# Patient Record
Sex: Female | Born: 1965
Health system: Southern US, Community
[De-identification: ages and names within clinical notes are randomized; demographics above are authoritative.]

## PROBLEM LIST (undated history)

## (undated) DIAGNOSIS — D509 Iron deficiency anemia, unspecified: Secondary | ICD-10-CM

## (undated) DIAGNOSIS — C50919 Malignant neoplasm of unspecified site of unspecified female breast: Secondary | ICD-10-CM

## (undated) DIAGNOSIS — Z923 Personal history of irradiation: Secondary | ICD-10-CM

## (undated) DIAGNOSIS — Z87442 Personal history of urinary calculi: Secondary | ICD-10-CM

## (undated) DIAGNOSIS — T8859XA Other complications of anesthesia, initial encounter: Secondary | ICD-10-CM

## (undated) DIAGNOSIS — K219 Gastro-esophageal reflux disease without esophagitis: Secondary | ICD-10-CM

## (undated) DIAGNOSIS — E611 Iron deficiency: Secondary | ICD-10-CM

## (undated) HISTORY — DX: Iron deficiency anemia, unspecified: D50.9

## (undated) HISTORY — DX: Malignant neoplasm of unspecified site of unspecified female breast: C50.919

## (undated) HISTORY — DX: Iron deficiency: E61.1

## (undated) HISTORY — PX: BREAST LUMPECTOMY: SHX2

---

## 2003-07-26 ENCOUNTER — Other Ambulatory Visit: Admission: RE | Admit: 2003-07-26 | Discharge: 2003-07-26 | Payer: Self-pay | Admitting: Dermatology

## 2004-04-09 ENCOUNTER — Ambulatory Visit (HOSPITAL_COMMUNITY): Admission: RE | Admit: 2004-04-09 | Discharge: 2004-04-09 | Payer: Self-pay | Admitting: Family Medicine

## 2005-06-04 ENCOUNTER — Ambulatory Visit (HOSPITAL_COMMUNITY): Admission: RE | Admit: 2005-06-04 | Discharge: 2005-06-04 | Payer: Self-pay | Admitting: Family Medicine

## 2006-01-29 ENCOUNTER — Ambulatory Visit (HOSPITAL_COMMUNITY): Admission: RE | Admit: 2006-01-29 | Discharge: 2006-01-29 | Payer: Self-pay | Admitting: Family Medicine

## 2006-02-14 HISTORY — PX: OTHER SURGICAL HISTORY: SHX169

## 2006-03-13 ENCOUNTER — Ambulatory Visit (HOSPITAL_COMMUNITY): Admission: RE | Admit: 2006-03-13 | Discharge: 2006-03-13 | Payer: Self-pay | Admitting: Surgery

## 2006-03-13 ENCOUNTER — Encounter (INDEPENDENT_AMBULATORY_CARE_PROVIDER_SITE_OTHER): Payer: Self-pay | Admitting: *Deleted

## 2006-04-15 ENCOUNTER — Ambulatory Visit (HOSPITAL_BASED_OUTPATIENT_CLINIC_OR_DEPARTMENT_OTHER): Admission: RE | Admit: 2006-04-15 | Discharge: 2006-04-15 | Payer: Self-pay | Admitting: Surgery

## 2006-04-15 ENCOUNTER — Encounter (INDEPENDENT_AMBULATORY_CARE_PROVIDER_SITE_OTHER): Payer: Self-pay | Admitting: Specialist

## 2006-04-15 ENCOUNTER — Emergency Department (HOSPITAL_COMMUNITY): Admission: EM | Admit: 2006-04-15 | Discharge: 2006-04-15 | Payer: Self-pay | Admitting: Emergency Medicine

## 2006-04-15 HISTORY — PX: OTHER SURGICAL HISTORY: SHX169

## 2006-05-16 ENCOUNTER — Ambulatory Visit (HOSPITAL_COMMUNITY): Payer: Self-pay | Admitting: Oncology

## 2006-05-20 ENCOUNTER — Ambulatory Visit: Admission: RE | Admit: 2006-05-20 | Discharge: 2006-07-23 | Payer: Self-pay | Admitting: Radiation Oncology

## 2007-01-21 ENCOUNTER — Ambulatory Visit (HOSPITAL_COMMUNITY): Payer: Self-pay | Admitting: Oncology

## 2007-01-21 ENCOUNTER — Encounter (HOSPITAL_COMMUNITY): Admission: RE | Admit: 2007-01-21 | Discharge: 2007-02-20 | Payer: Self-pay | Admitting: Oncology

## 2007-05-15 ENCOUNTER — Ambulatory Visit (HOSPITAL_COMMUNITY): Payer: Self-pay | Admitting: Oncology

## 2008-02-18 ENCOUNTER — Encounter (HOSPITAL_COMMUNITY): Admission: RE | Admit: 2008-02-18 | Discharge: 2008-03-19 | Payer: Self-pay | Admitting: Oncology

## 2008-05-20 ENCOUNTER — Ambulatory Visit (HOSPITAL_COMMUNITY): Payer: Self-pay | Admitting: Oncology

## 2009-02-02 ENCOUNTER — Ambulatory Visit (HOSPITAL_COMMUNITY): Admission: RE | Admit: 2009-02-02 | Discharge: 2009-02-02 | Payer: Self-pay | Admitting: Family Medicine

## 2009-03-22 ENCOUNTER — Ambulatory Visit (HOSPITAL_COMMUNITY): Admission: RE | Admit: 2009-03-22 | Discharge: 2009-03-22 | Payer: Self-pay | Admitting: Oncology

## 2009-06-02 ENCOUNTER — Encounter (HOSPITAL_COMMUNITY): Admission: RE | Admit: 2009-06-02 | Discharge: 2009-07-02 | Payer: Self-pay | Admitting: Oncology

## 2009-06-02 ENCOUNTER — Ambulatory Visit (HOSPITAL_COMMUNITY): Payer: Self-pay | Admitting: Oncology

## 2009-07-31 ENCOUNTER — Encounter (HOSPITAL_COMMUNITY): Admission: RE | Admit: 2009-07-31 | Discharge: 2009-08-30 | Payer: Self-pay | Admitting: Oncology

## 2009-07-31 ENCOUNTER — Ambulatory Visit (HOSPITAL_COMMUNITY): Payer: Self-pay | Admitting: Oncology

## 2009-11-03 ENCOUNTER — Encounter (HOSPITAL_COMMUNITY)
Admission: RE | Admit: 2009-11-03 | Discharge: 2009-12-03 | Payer: Self-pay | Source: Home / Self Care | Admitting: Oncology

## 2010-03-28 LAB — CBC
HCT: 37.5 % (ref 36.0–46.0)
MCH: 30.7 pg (ref 26.0–34.0)
RBC: 4.14 MIL/uL (ref 3.87–5.11)
RDW: 13.6 % (ref 11.5–15.5)
WBC: 5.7 10*3/uL (ref 4.0–10.5)

## 2010-03-31 LAB — DIFFERENTIAL
Eosinophils Absolute: 0.1 10*3/uL (ref 0.0–0.7)
Lymphs Abs: 1.4 10*3/uL (ref 0.7–4.0)
Monocytes Relative: 8 % (ref 3–12)
Neutro Abs: 3.6 10*3/uL (ref 1.7–7.7)
Neutrophils Relative %: 65 % (ref 43–77)

## 2010-03-31 LAB — FERRITIN: Ferritin: 14 ng/mL (ref 10–291)

## 2010-03-31 LAB — CBC
Hemoglobin: 12.3 g/dL (ref 12.0–15.0)
MCH: 28.4 pg (ref 26.0–34.0)
MCV: 84.4 fL (ref 78.0–100.0)
Platelets: 138 10*3/uL — ABNORMAL LOW (ref 150–400)
RBC: 4.32 MIL/uL (ref 3.87–5.11)

## 2010-04-02 ENCOUNTER — Other Ambulatory Visit (HOSPITAL_COMMUNITY): Payer: Self-pay | Admitting: Family Medicine

## 2010-04-02 DIAGNOSIS — C50919 Malignant neoplasm of unspecified site of unspecified female breast: Secondary | ICD-10-CM

## 2010-04-02 LAB — FOLATE: Folate: >20 ng/mL

## 2010-04-02 LAB — CBC
HCT: 34.9 % — ABNORMAL LOW (ref 36.0–46.0)
MCV: 77.8 fL — ABNORMAL LOW (ref 78.0–100.0)
Platelets: 181 10*3/uL (ref 150–400)
RBC: 4.48 MIL/uL (ref 3.87–5.11)

## 2010-04-02 LAB — FERRITIN: Ferritin: 14 ng/mL (ref 10–291)

## 2010-04-02 LAB — IRON AND TIBC
Iron: 23 ug/dL — ABNORMAL LOW (ref 42–135)
UIBC: 353 ug/dL

## 2010-04-02 LAB — VITAMIN B12: Vitamin B-12: 344 pg/mL (ref 211–911)

## 2010-04-02 LAB — DIFFERENTIAL
Eosinophils Relative: 1 % (ref 0–5)
Lymphocytes Relative: 13 % (ref 12–46)
Monocytes Absolute: 0.5 10*3/uL (ref 0.1–1.0)
Monocytes Relative: 5 % (ref 3–12)
Neutrophils Relative %: 81 % — ABNORMAL HIGH (ref 43–77)

## 2010-04-02 LAB — RETICULOCYTES
RBC.: 4.48 MIL/uL (ref 3.87–5.11)
Retic Count, Absolute: 44.8 10*3/uL (ref 19.0–186.0)
Retic Ct Pct: 1 % (ref 0.4–3.1)

## 2010-04-02 LAB — COMPREHENSIVE METABOLIC PANEL
BUN: 12 mg/dL (ref 6–23)
CO2: 28 mEq/L (ref 19–32)
Chloride: 104 mEq/L (ref 96–112)
GFR calc Af Amer: >60 mL/min (ref >60)
Glucose, Bld: 77 mg/dL (ref 70–99)
Potassium: 3.9 mEq/L (ref 3.5–5.1)

## 2010-04-11 ENCOUNTER — Ambulatory Visit (HOSPITAL_COMMUNITY)
Admission: RE | Admit: 2010-04-11 | Discharge: 2010-04-11 | Disposition: A | Discharge status: Home / Self Care | Payer: BC Managed Care – PPO | Source: Ambulatory Visit | Attending: Family Medicine | Admitting: Family Medicine

## 2010-04-11 DIAGNOSIS — Z853 Personal history of malignant neoplasm of breast: Secondary | ICD-10-CM | POA: Insufficient documentation

## 2010-04-11 DIAGNOSIS — C50919 Malignant neoplasm of unspecified site of unspecified female breast: Secondary | ICD-10-CM

## 2010-05-03 ENCOUNTER — Other Ambulatory Visit (HOSPITAL_COMMUNITY): Payer: Self-pay

## 2010-05-04 ENCOUNTER — Encounter (HOSPITAL_COMMUNITY): Payer: BC Managed Care – PPO | Attending: Oncology

## 2010-05-04 DIAGNOSIS — Z853 Personal history of malignant neoplasm of breast: Secondary | ICD-10-CM | POA: Insufficient documentation

## 2010-05-04 DIAGNOSIS — D509 Iron deficiency anemia, unspecified: Secondary | ICD-10-CM | POA: Insufficient documentation

## 2010-05-04 DIAGNOSIS — C50919 Malignant neoplasm of unspecified site of unspecified female breast: Secondary | ICD-10-CM

## 2010-05-07 ENCOUNTER — Encounter (HOSPITAL_COMMUNITY): Payer: BC Managed Care – PPO | Admitting: Oncology

## 2010-05-07 DIAGNOSIS — C50919 Malignant neoplasm of unspecified site of unspecified female breast: Secondary | ICD-10-CM

## 2010-06-01 NOTE — Op Note (Signed)
Melissa Rodriguez, PONDEXTER                 ACCOUNT NO.:  [id number]   MEDICAL RECORD NO.:  [id number]          PATIENT TYPE:  AMB   LOCATION:  DAY                          FACILITY:  [facility]   PHYSICIAN:  Thomas A. Cornett, M.D.DATE OF BIRTH:  Jun 08, 1965   DATE OF PROCEDURE:  03/13/2006  DATE OF DISCHARGE:                               OPERATIVE REPORT   PREOPERATIVE DIAGNOSIS:  Left breast mass.   POSTOPERATIVE DIAGNOSIS:  Left breast mass.   PROCEDURE:  Open left excisional breast biopsy.   SURGEON:  Harriette Bouillon, MD   ANESTHESIA:  MAC with 0.25% Sensorcaine with epinephrine.   ESTIMATED BLOOD LOSS:  10 mL.   SPECIMEN:  Left breast nodule to pathology for evaluation.   INDICATIONS FOR PROCEDURE:  The patient is a 45 year old female who is  noted to have a left breast mass.  This was too superficial to do a  biopsy by core technique, and she requires an excisional biopsy for  further evaluation.  The procedure was discussed with the patient.  She  voiced understanding and agreed to proceed.   DESCRIPTION OF PROCEDURE:  The patient was brought to the operating  room, placed supine.  Left breast was prepped and draped in a sterile  fashion.  The nodule was located at about the 7 o'clock position in her  left medial inferior quadrant.  This was marked preoperatively.  After  sterile prep and drape, local anesthesia was infiltrated around it and  small incision was made over it.  An Lavena Bullion was used to grasp the nodule,  and this was excised with some surrounding breast tissue that appeared  to be fibrocystic in nature.  This was taken all way down to the chest  wall.  The specimen was oriented and sent to pathology for evaluation.  The wound was irrigated and found be hemostatic.  The wound was closed  using 4-0 Monocryl and Dermabond.  The patient tolerated the procedure  well.  All final counts of sponge, needle and instruments were found to  be correct at this portion of the case.   The patient was awoke and taken  to recovery in satisfactory condition.      Thomas A. Cornett, M.D.  Electronically Signed     TAC/MEDQ  D:  03/13/2006  T:  03/13/2006  Job:  [id number]   cc:   Lorin Picket A. Gerda Diss, MD  Fax: [phone]

## 2010-06-01 NOTE — Op Note (Signed)
Melissa Rodriguez, Melissa Rodriguez                 ACCOUNT NO.:  [id number]   MEDICAL RECORD NO.:  [id number]          PATIENT TYPE:  OUT   LOCATION:  NUC                          FACILITY:  [facility]   PHYSICIAN:  Maisie Fus A. Cornett, M.D.DATE OF BIRTH:  [birth year]   DATE OF PROCEDURE:  04/15/2006  DATE OF DISCHARGE:                               OPERATIVE REPORT   PREOPERATIVE DIAGNOSIS:  Left breast cancer, status post left breast  lumpectomy.   POSTOPERATIVE DIAGNOSIS:  Left breast cancer, status post left breast  lumpectomy.   PROCEDURE:  Left axillary sentinel lymph node mapping with injection of  methylene blue dye.   SURGEON:  Harriette Bouillon, M.D.   ANESTHESIA:  LMA with 20 mL of 0.25% Sensorcaine local.   ESTIMATED BLOOD LOSS:  10 mL.   SPECIMEN:  Four left axillary sentinel lymph nodes found to be negative  by touch prep.   DRAINS:  None.   INDICATIONS FOR PROCEDURE:  The patient is a 45 year old female found to  have a left breast mass.  She underwent lumpectomy, which showed roughly  0.7 cm tubular carcinoma of the left breast with a adjacent area of  DCIS.  All margins were clear except the deep margin, which was muscle.  I did not feel re-excision this point was warranted being all the deep  margin were clear and that she needed sentinel lymph node mapping at  this point.  She understood and agreed to proceed.   DESCRIPTION OF PROCEDURE:  The patient underwent injection by nuclear  med approximately 30 minutes prior to starting the case.  She was then  taken back to the operating room.  LMA anesthesia was initiated and the  left breast and left axilla were prepped and draped in sterile fashion.  NeoProbe was used and a small incision was made in the left axilla.  Dissection was carried down into the left axillary lymphatics.  Prior to  this 4 mL of methylene blue dye were injected in a subareolar position  and massaged for five minutes.  We found blue lymphatics leading to  approximately three blue hot lymph nodes.  There is a fourth node  associated with this which were sent as well but was not blue or hot.  All four nodes were evaluated by touch prep and found to be negative for  metastatic disease.  Hemostasis was excellent.  We then closed the wound  with a combination of 3-0 Vicryl deep layer and four  Monocryl subcuticular layer.  Steri-Strips, dry dressings were applied.  All final counts of sponge, needle and instruments were found to be  correct this portion of the case.  The patient was awoke taken to  recovery in satisfactory condition.      Thomas A. Cornett, M.D.  Electronically Signed     TAC/MEDQ  D:  04/15/2006  T:  04/15/2006  Job:  [id number]   cc:   Lorin Picket A. Gerda Diss, MD  Elmer Sow. Dorna Bloom, M.D.  Valentino Hue. Magrinat, M.D.

## 2010-10-04 LAB — DIFFERENTIAL
Basophils Absolute: 0
Basophils Relative: 0
Neutro Abs: 4.8
Neutrophils Relative %: 74

## 2010-10-04 LAB — COMPREHENSIVE METABOLIC PANEL
Alkaline Phosphatase: 48
BUN: 9
CO2: 27
Chloride: 108
GFR calc non Af Amer: >60
Glucose, Bld: 103 — ABNORMAL HIGH
Potassium: 3.8
Total Bilirubin: 0.5

## 2010-10-04 LAB — CBC: RDW: 13.7

## 2010-11-05 ENCOUNTER — Encounter (HOSPITAL_COMMUNITY): Payer: BC Managed Care – PPO | Attending: Oncology

## 2010-11-05 DIAGNOSIS — D509 Iron deficiency anemia, unspecified: Secondary | ICD-10-CM

## 2010-11-05 LAB — CBC
HCT: 38.8 % (ref 36.0–46.0)
Hemoglobin: 13 g/dL (ref 12.0–15.0)
MCV: 92.6 fL (ref 78.0–100.0)
Platelets: 142 10*3/uL — ABNORMAL LOW (ref 150–400)
RBC: 4.19 MIL/uL (ref 3.87–5.11)
RBC: 4.22 MIL/uL (ref 3.87–5.11)
WBC: 5.5 10*3/uL (ref 4.0–10.5)

## 2010-11-05 NOTE — Progress Notes (Signed)
Melissa Rodriguez presented for labwork. Labs per MD order drawn via peripheral stick lt ac. Lab drawn for cbc and ferritin. Procedure without incident.  Patient tolerated procedure well.

## 2010-11-11 ENCOUNTER — Encounter (HOSPITAL_COMMUNITY): Payer: Self-pay

## 2010-11-12 ENCOUNTER — Encounter (HOSPITAL_COMMUNITY): Payer: Self-pay | Admitting: Oncology

## 2010-11-12 ENCOUNTER — Encounter (HOSPITAL_COMMUNITY): Payer: BC Managed Care – PPO | Admitting: Oncology

## 2010-11-12 DIAGNOSIS — D509 Iron deficiency anemia, unspecified: Secondary | ICD-10-CM

## 2010-11-12 DIAGNOSIS — C50919 Malignant neoplasm of unspecified site of unspecified female breast: Secondary | ICD-10-CM

## 2010-11-12 NOTE — Patient Instructions (Signed)
[facility]  Discharge Instructions  RECOMMENDATIONS MADE BY THE CONSULTANT AND ANY TEST RESULTS WILL BE SENT TO YOUR REFERRING DOCTOR.   EXAM FINDINGS BY MD TODAY AND SIGNS AND SYMPTOMS TO REPORT TO CLINIC OR PRIMARY MD: you are doing well.  Your ferritin is 26.  Your platelets are slightly lower but are nothing to worry about.  Report increased ice intake, shortness of breath, increased fatigue, unusual bruising or bleeding.  MEDICATIONS PRESCRIBED: continue the iron     SPECIAL INSTRUCTIONS/FOLLOW-UP: Return to Clinic in 6 months for labs then MD   I acknowledge that I have been informed and understand all the instructions given to me and received a copy. I do not have any more questions at this time, but understand that I may call the Specialty Clinic at [facility] at [phone] during business hours should I have any further questions or need assistance in obtaining follow-up care.    __________________________________________  _____________  __________ Signature of Patient or Authorized Representative            Date                   Time    __________________________________________ Nurse's Signature

## 2010-11-12 NOTE — Progress Notes (Signed)
CC:   Scott A. Gerda Diss, MD Clovis Pu Cornett, M.D. Maryln Gottron, M.D.  DIAGNOSIS: 1. Stage I (T1b N0), grade 1, well-differentiated adenocarcinoma of     the medial lower quadrant of the left breast.  It was a tubular     carcinoma, status post lumpectomy on 03/13/2006, ER positive 79%,     PR positive 92%, HER2/neu negative, Ki-67 marker low at 18%.  Her     cancer was 7 mm in size, and she had 4 sentinel nodes all negative     along with her lumpectomy.  She was treated with radiation therapy     postoperatively and opted not to take further therapy.  She is here     for routine followup. 2. Iron deficiency anemia due to excessive GU bleeding which has now     stabilized on oral iron twice a day.  Ferritin has risen from 22     now to 26 since she was here last. 3. Right preauricular skin cancer removed years ago. 4. Allergic rhinitis. Shylo is still very thin, unchanged in weight, fully active, normal performance status, etc.  Her vital signs are all stable.  Review of systems negative.  She is in no acute distress.  Nail color is normal. No arm edema.  No leg edema.  Lungs are clear to auscultation and percussion.  She has no adenopathy in any location.  Both breasts are negative for masses.  Scars well healed.  Heart shows a regular rhythm and rate without murmur, rub or gallop.  Abdomen is soft, nontender, without organomegaly.  We will see her back in 6 months.  If all is well, then we will make it once a year.  We will check her CBC and ferritin in 6 months.  She is still menstruating, but she states it is not as severe as it was.  So, it is sometimes longer as far as menstruation but not as heavy.  We will see her sooner if need be.    ______________________________ Ladona Horns. Mariel Sleet, MD ESN/MEDQ  D:  11/12/2010  T:  11/12/2010  Job:  [id number]

## 2010-11-12 NOTE — Progress Notes (Signed)
This office note has been dictated.

## 2011-03-22 ENCOUNTER — Other Ambulatory Visit: Payer: Self-pay | Admitting: Family Medicine

## 2011-03-22 DIAGNOSIS — Z9889 Other specified postprocedural states: Secondary | ICD-10-CM

## 2011-04-17 ENCOUNTER — Ambulatory Visit (HOSPITAL_COMMUNITY)
Admission: RE | Admit: 2011-04-17 | Discharge: 2011-04-17 | Disposition: A | Discharge status: Home / Self Care | Payer: BC Managed Care – PPO | Source: Ambulatory Visit | Attending: Family Medicine | Admitting: Family Medicine

## 2011-04-17 DIAGNOSIS — Z853 Personal history of malignant neoplasm of breast: Secondary | ICD-10-CM | POA: Insufficient documentation

## 2011-04-17 DIAGNOSIS — Z9889 Other specified postprocedural states: Secondary | ICD-10-CM

## 2011-04-17 DIAGNOSIS — Z1231 Encounter for screening mammogram for malignant neoplasm of breast: Secondary | ICD-10-CM | POA: Insufficient documentation

## 2011-05-13 ENCOUNTER — Other Ambulatory Visit (HOSPITAL_COMMUNITY): Payer: BC Managed Care – PPO

## 2011-05-14 ENCOUNTER — Ambulatory Visit (HOSPITAL_COMMUNITY): Payer: BC Managed Care – PPO | Admitting: Oncology

## 2011-05-17 ENCOUNTER — Ambulatory Visit (HOSPITAL_COMMUNITY): Payer: BC Managed Care – PPO | Admitting: Oncology

## 2011-05-17 ENCOUNTER — Encounter (HOSPITAL_COMMUNITY): Payer: BC Managed Care – PPO | Attending: Oncology

## 2011-05-17 DIAGNOSIS — Z85828 Personal history of other malignant neoplasm of skin: Secondary | ICD-10-CM | POA: Insufficient documentation

## 2011-05-17 DIAGNOSIS — Z09 Encounter for follow-up examination after completed treatment for conditions other than malignant neoplasm: Secondary | ICD-10-CM | POA: Insufficient documentation

## 2011-05-17 DIAGNOSIS — W57XXXA Bitten or stung by nonvenomous insect and other nonvenomous arthropods, initial encounter: Secondary | ICD-10-CM | POA: Insufficient documentation

## 2011-05-17 DIAGNOSIS — S90569A Insect bite (nonvenomous), unspecified ankle, initial encounter: Secondary | ICD-10-CM | POA: Insufficient documentation

## 2011-05-17 DIAGNOSIS — C50919 Malignant neoplasm of unspecified site of unspecified female breast: Secondary | ICD-10-CM

## 2011-05-17 DIAGNOSIS — IMO0001 Reserved for inherently not codable concepts without codable children: Secondary | ICD-10-CM | POA: Insufficient documentation

## 2011-05-17 DIAGNOSIS — Z853 Personal history of malignant neoplasm of breast: Secondary | ICD-10-CM | POA: Insufficient documentation

## 2011-05-17 DIAGNOSIS — D509 Iron deficiency anemia, unspecified: Secondary | ICD-10-CM

## 2011-05-17 DIAGNOSIS — D500 Iron deficiency anemia secondary to blood loss (chronic): Secondary | ICD-10-CM | POA: Insufficient documentation

## 2011-05-17 LAB — CBC
MCV: 89.7 fL (ref 78.0–100.0)
Platelets: 161 10*3/uL (ref 150–400)
RBC: 4.45 MIL/uL (ref 3.87–5.11)
RDW: 12.8 % (ref 11.5–15.5)
WBC: 5.7 10*3/uL (ref 4.0–10.5)

## 2011-05-17 LAB — FERRITIN: Ferritin: 39 ng/mL (ref 10–291)

## 2011-05-17 NOTE — Progress Notes (Signed)
Labs drawn today for cbc,ferr 

## 2011-05-20 ENCOUNTER — Encounter (HOSPITAL_BASED_OUTPATIENT_CLINIC_OR_DEPARTMENT_OTHER): Payer: BC Managed Care – PPO | Admitting: Oncology

## 2011-05-20 ENCOUNTER — Encounter (HOSPITAL_COMMUNITY): Payer: Self-pay | Admitting: Oncology

## 2011-05-20 VITALS — BP 99/66 | HR 81 | Temp 97.8°F | Wt 93.8 lb

## 2011-05-20 DIAGNOSIS — C50319 Malignant neoplasm of lower-inner quadrant of unspecified female breast: Secondary | ICD-10-CM

## 2011-05-20 DIAGNOSIS — C50919 Malignant neoplasm of unspecified site of unspecified female breast: Secondary | ICD-10-CM

## 2011-05-20 DIAGNOSIS — Z170 Estrogen receptor positive status [ER+]: Secondary | ICD-10-CM

## 2011-05-20 DIAGNOSIS — D509 Iron deficiency anemia, unspecified: Secondary | ICD-10-CM

## 2011-05-20 DIAGNOSIS — W57XXXA Bitten or stung by nonvenomous insect and other nonvenomous arthropods, initial encounter: Secondary | ICD-10-CM

## 2011-05-20 HISTORY — DX: Malignant neoplasm of unspecified site of unspecified female breast: C50.919

## 2011-05-20 HISTORY — DX: Iron deficiency anemia, unspecified: D50.9

## 2011-05-20 NOTE — Progress Notes (Signed)
Melissa Punt, MD, MD [address] [place] [address]  1. Adenocarcinoma of breast   2. Iron deficiency anemia     CURRENT THERAPY: Observation  INTERVAL HISTORY: Melissa Rodriguez 46 y.o. female returns for  regular  visit for followup of Stage I (T1b N0), grade 1, well-differentiated adenocarcinoma of the medial lower quadrant of the left breast. It was a tubular carcinoma, status post lumpectomy on 03/13/2006, ER positive 79%, PR positive 92%, HER2/neu negative, Ki-67 marker low at 18%. Her cancer was 7 mm in size, and she had 4 sentinel nodes all negative along with her lumpectomy. She was treated with radiation therapy postoperatively and opted not to take further therapy.  The patient reports a tick bite on her left knee about 1 week ago.  She removed the tick.  She reports that it was a small tick and "hard to see."  She notes some myalgias of the upper leg.  She denies any fevers, chills, night sweats, headaches, rash.    The patient notes that due to her small breast size, mammograms are very painful for her.  We may be able to justify an MRI of breast every other year.  She was encouraged to continue with yearly mammograms.  Otherwise, the patient is doing very well.  She denies any complaints.  She denies any breast changes.   I personally reviewed and went over radiographic studies with the patient.  Her ferritin continues to improve on oral iron.   I personally reviewed and went over radiographic studies with the patient.  Her mammogram was performed recently and did not reveal any worrisome findings.      Past Medical History  Diagnosis Date  . Breast cancer   . Iron deficiency   . Adenocarcinoma of breast 05/20/2011    Stage I (T1b N0), grade 1, well-differentiated adenocarcinoma of the medial lower quadrant of the left breast. It was a tubular carcinoma, status post lumpectomy on 03/13/2006, ER positive 79%, PR positive 92%, HER2/neu negative, Ki-67 marker low at 18%.  Her cancer was 7 mm in size, and she had 4 sentinel nodes all negative along with her lumpectomy. She was treated with radiation therapy postoper  . Iron deficiency anemia 05/20/2011    Secondary to GU bleeding.  Good absorber of PO iron    has Adenocarcinoma of breast and Iron deficiency anemia on her problem list.     is allergic to benadryl; ciprofloxacin; and penicillins.  Ms. Molla does not currently have medications on file.  Past Surgical History  Procedure Date  . Lumpectom left breast 02/2006  . Lymph node biopsy left 04/2006    Denies any headaches, dizziness, double vision, fevers, chills, night sweats, nausea, vomiting, diarrhea, constipation, chest pain, heart palpitations, shortness of breath, blood in stool, black tarry stool, urinary pain, urinary burning, urinary frequency, hematuria.   PHYSICAL EXAMINATION  ECOG PERFORMANCE STATUS: 0 - Asymptomatic  Filed Vitals:   05/20/11 1122  BP: 99/66  Pulse: 81  Temp: 97.8 F (36.6 C)    GENERAL:alert, healthy, no distress, well nourished, well developed, comfortable, cooperative, smiling and skinny as usual SKIN: skin color, texture, turgor are normal, no rashes or significant lesions HEAD: Normocephalic, No masses, lesions, tenderness or abnormalities EYES: normal, EOMI, Conjunctiva are pink and non-injected EARS: External ears normal OROPHARYNX:lips, buccal mucosa, and tongue normal and mucous membranes are moist  NECK: supple, no adenopathy, no bruits, thyroid normal size, non-tender, without nodularity, no stridor, non-tender, trachea midline LYMPH:  no palpable lymphadenopathy, no hepatosplenomegaly BREAST:patient declines to have breast exam LUNGS: clear to auscultation and percussion HEART: regular rate & rhythm, no murmurs, no gallops, S1 normal and S2 normal ABDOMEN:abdomen soft, non-tender, normal bowel sounds, no masses or organomegaly and no hepatosplenomegaly BACK: Back symmetric, no curvature., No CVA  tenderness EXTREMITIES:less then 2 second capillary refill, no joint deformities, effusion, or inflammation, no edema, no skin discoloration, no clubbing, no cyanosis, positive findings:  Left inferior to patella lesion.  Flashlight exam reveals some darkened areas within the lesion, potentially pieces of tick.  No expanding erythema.  No active signs or indications of infection.  NEURO: alert & oriented x 3 with fluent speech, no focal motor/sensory deficits, gait normal   LABORATORY DATA: CBC    Component Value Date/Time   WBC 5.7 05/17/2011 1004   RBC 4.45 05/17/2011 1004   HGB 13.2 05/17/2011 1004   HCT 39.9 05/17/2011 1004   PLT 161 05/17/2011 1004   MCV 89.7 05/17/2011 1004   MCH 29.7 05/17/2011 1004   MCHC 33.1 05/17/2011 1004   RDW 12.8 05/17/2011 1004   LYMPHSABS 1.4 07/31/2009 0840   MONOABS 0.5 07/31/2009 0840   EOSABS 0.1 07/31/2009 0840   BASOSABS 0.0 07/31/2009 0840    Lab Results  Component Value Date   FERRITIN 39 05/17/2011     RADIOGRAPHIC STUDIES:  04/17/2011  *RADIOLOGY REPORT*  Clinical Data: History of left breast cancer status post  lumpectomy 2008  DIGITAL DIAGNOSTIC BILATERAL MAMMOGRAM WITH CAD  Comparison: With priors  Findings: There is a dense fibroglandular pattern. Stable  lumpectomy changes are seen in the left breast. There is no new  suspicious mass, or malignant-type microcalcifications.  Mammographic images were processed with CAD.  IMPRESSION:  No evidence of malignancy in either breast. Diagnostic mammogram  in 1 year is recommended.  BI-RADS CATEGORY 2: Benign finding(s).  Original Report Authenticated By: Littie Deeds. ARCEO, M.D.    ASSESSMENT:  1. Stage I (T1b N0), grade 1, well-differentiated adenocarcinoma of the medial lower quadrant of the left breast. It was a tubular carcinoma, status post lumpectomy on 03/13/2006, ER positive 79%, PR positive 92%, HER2/neu negative, Ki-67 marker low at 18%. Her cancer was 7 mm in size, and she had 4 sentinel nodes  all negative along with her lumpectomy. She was treated with radiation therapy postoperatively and opted not to take further therapy. She is here for routine followup.  2. Iron deficiency anemia due to excessive GU bleeding which has now stabilized on oral iron twice a day. Ferritin has risen from 26 now to 39 since she was here last.  3. Right preauricular skin cancer removed years ago.  4. Allergic rhinitis. 5. Tick bite with myalgias in upper legs.    PLAN:  1. Removal of "tick" body parts with 21 G needle and forceps after using alcohol to sanitize skin.  These darkened areas are removed without any complications.  Band-aid placed. 2. I personally reviewed and went over laboratory results with the patient. 3. I personally reviewed and went over radiographic studies with the patient. 4. Return tomorrow for lab work: [facility] Fever IgM and IgG and Lyme's Antibodies tests.  Patient wishes to return fro these tests because she wants to eat before blood work. 5. Lab work in 6 months: CBC, ferritin 6. Lab work in 1 year: CBC diff, CMET, Ferritin 7. Continue yearly mammogram.  May consider MRI of breast every other year.  Patient will let us know if  she wishes to do this. 8. Return in 1 year for follow-up   All questions were answered. The patient knows to call the clinic with any problems, questions or concerns. We can certainly see the patient much sooner if necessary.  The patient and plan discussed with Glenford Peers, MD and he is in agreement with the aforementioned.  I spent 25 minutes counseling the patient face to face. The total time spent in the appointment was 35 minutes.  Nasri Boakye

## 2011-05-20 NOTE — Patient Instructions (Addendum)
Melissa Rodriguez  [id number] 03/13/1965 Dr. Glenford Peers   [facility]  Discharge Instructions  RECOMMENDATIONS MADE BY THE CONSULTANT AND ANY TEST RESULTS WILL BE SENT TO YOUR REFERRING DOCTOR.   EXAM FINDINGS BY MD TODAY AND SIGNS AND SYMPTOMS TO REPORT TO CLINIC OR PRIMARY MD: Exam and discussion per PA.  Will bring you back on Wednesday for blood work to check for [facility] Spotted Fever and Lyme's Disease.  MEDICATIONS PRESCRIBED: none   INSTRUCTIONS GIVEN AND DISCUSSED: Other :  Report any new lumps, bone pain or shortness of breath, craving of ice or foods high in carbohydrates.  SPECIAL INSTRUCTIONS/FOLLOW-UP: Lab work Needed in 6 months and Return to Clinic after labs in 6 months.   I acknowledge that I have been informed and understand all the instructions given to me and received a copy. I do not have any more questions at this time, but understand that I may call the Specialty Clinic at [facility] at [phone] during business hours should I have any further questions or need assistance in obtaining follow-up care.    __________________________________________  _____________  __________ Signature of Patient or Authorized Representative            Date                   Time    __________________________________________ Nurse's Signature

## 2011-05-22 ENCOUNTER — Encounter (HOSPITAL_BASED_OUTPATIENT_CLINIC_OR_DEPARTMENT_OTHER): Payer: BC Managed Care – PPO

## 2011-05-22 DIAGNOSIS — W57XXXA Bitten or stung by nonvenomous insect and other nonvenomous arthropods, initial encounter: Secondary | ICD-10-CM

## 2011-05-22 DIAGNOSIS — D509 Iron deficiency anemia, unspecified: Secondary | ICD-10-CM

## 2011-05-22 NOTE — Progress Notes (Signed)
Labs drawn today

## 2011-05-23 LAB — ROCKY MTN SPOTTED FVR AB, IGM-BLOOD: RMSF IgM: 0.18 IV (ref 0.00–0.89)

## 2011-05-23 LAB — B. BURGDORFI ANTIBODIES: B burgdorferi Ab IgG+IgM: 0.86 {ISR}

## 2011-11-04 ENCOUNTER — Encounter (HOSPITAL_COMMUNITY): Payer: BC Managed Care – PPO | Attending: Oncology

## 2011-11-04 DIAGNOSIS — D509 Iron deficiency anemia, unspecified: Secondary | ICD-10-CM | POA: Insufficient documentation

## 2011-11-04 LAB — CBC
HCT: 39.4 % (ref 36.0–46.0)
Hemoglobin: 13.2 g/dL (ref 12.0–15.0)
MCHC: 33.5 g/dL (ref 30.0–36.0)
MCV: 91.4 fL (ref 78.0–100.0)

## 2011-11-04 NOTE — Progress Notes (Signed)
Labs drawn today for cbc,ferr 

## 2011-11-05 ENCOUNTER — Other Ambulatory Visit (HOSPITAL_COMMUNITY): Payer: Self-pay | Admitting: Oncology

## 2011-11-05 DIAGNOSIS — D509 Iron deficiency anemia, unspecified: Secondary | ICD-10-CM

## 2012-02-03 ENCOUNTER — Encounter (HOSPITAL_COMMUNITY): Payer: BC Managed Care – PPO | Attending: Oncology

## 2012-02-03 DIAGNOSIS — D509 Iron deficiency anemia, unspecified: Secondary | ICD-10-CM | POA: Insufficient documentation

## 2012-02-03 LAB — CBC
HCT: 39.8 % (ref 36.0–46.0)
MCH: 30.2 pg (ref 26.0–34.0)
MCV: 90.2 fL (ref 78.0–100.0)
RDW: 13.2 % (ref 11.5–15.5)
WBC: 5.7 10*3/uL (ref 4.0–10.5)

## 2012-02-03 NOTE — Progress Notes (Signed)
Labs drawn today for cbc,Iron and IBC,ferr 

## 2012-02-04 LAB — IRON AND TIBC
Saturation Ratios: 18 % — ABNORMAL LOW (ref 20–55)
UIBC: 270 ug/dL (ref 125–400)

## 2012-04-22 ENCOUNTER — Other Ambulatory Visit (HOSPITAL_COMMUNITY): Payer: Self-pay | Admitting: Oncology

## 2012-04-22 DIAGNOSIS — IMO0001 Reserved for inherently not codable concepts without codable children: Secondary | ICD-10-CM

## 2012-05-13 ENCOUNTER — Ambulatory Visit (HOSPITAL_COMMUNITY)
Admission: RE | Admit: 2012-05-13 | Discharge: 2012-05-13 | Disposition: A | Discharge status: Home / Self Care | Payer: BC Managed Care – PPO | Source: Ambulatory Visit | Attending: Oncology | Admitting: Oncology

## 2012-05-13 DIAGNOSIS — IMO0001 Reserved for inherently not codable concepts without codable children: Secondary | ICD-10-CM

## 2012-05-13 DIAGNOSIS — Z853 Personal history of malignant neoplasm of breast: Secondary | ICD-10-CM | POA: Insufficient documentation

## 2012-05-17 NOTE — Progress Notes (Signed)
Melissa Punt, MD [address] [place] [address]  Adenocarcinoma of breast, left - Plan: CBC with Differential, CBC with Differential, Comprehensive metabolic panel  Iron deficiency anemia - Plan: Iron and TIBC, CBC with Differential, Iron and TIBC, Ferritin, CBC with Differential, Iron and TIBC, Ferritin, Ferritin, Ferritin, Iron and TIBC, Iron and TIBC  Adenocarcinoma of breast - Plan: Differential, Differential, Comprehensive metabolic panel, Comprehensive metabolic panel, CBC, CBC  CURRENT THERAPY:Observation  INTERVAL HISTORY: Melissa Rodriguez 47 y.o. female returns for  regular  visit for followup of Stage I (T1b N0), grade 1, well-differentiated adenocarcinoma of the medial lower quadrant of the left breast. It was a tubular carcinoma, status post lumpectomy on 03/13/2006, ER positive 79%, PR positive 92%, HER2/neu negative, Ki-67 marker low at 18%. Her cancer was 7 mm in size, and she had 4 sentinel nodes all negative along with her lumpectomy. She was treated with radiation therapy postoperatively and opted not to take further therapy.   Melissa Rodriguez is doing well.  She denies any complaints this AM.  Oncologic and hematologic ROS questioning is negative.   I personally reviewed and went over laboratory results with the patient.  Melissa Rodriguez iron studies are stable on oral ferrous sulfate.  She is tolerating the iron well. Her iron studies show an adequate ferritin level with a solid TIBC.  Her CBC is remarkable for trace thrombocytopenia at 142,000, but she typically has a platelet count in the low-normal range with episodes sub-normal.  This is her normal.  Her WBC and Hgb are both within normal limits.    I personally reviewed and went over radiographic studies with the patient.  Screening mammogram is negative and she will be due for her next one in April 2015.   Melissa Rodriguez is doing well and really denies any complaints.  We will perform lab work today and again in 6 months and 1 year to  keep tabs on her iron studies which is well controlled with PO ferrous sulfate.  She reports that her menstruation cycles are changing and she just completed her menses about  Week ago, but this is her first menses in 2014. As a result, her iron stores may be depleted more than usual; we will wait and see.    Past Medical History  Diagnosis Date  . Breast cancer   . Iron deficiency   . Adenocarcinoma of breast 05/20/2011    Stage I (T1b N0), grade 1, well-differentiated adenocarcinoma of the medial lower quadrant of the left breast. It was a tubular carcinoma, status post lumpectomy on 03/13/2006, ER positive 79%, PR positive 92%, HER2/neu negative, Ki-67 marker low at 18%. Her cancer was 7 mm in size, and she had 4 sentinel nodes all negative along with her lumpectomy. She was treated with radiation therapy postoper  . Iron deficiency anemia 05/20/2011    Secondary to GU bleeding.  Good absorber of PO iron    has Adenocarcinoma of breast and Iron deficiency anemia on her problem list.     is allergic to benadryl; ciprofloxacin; and penicillins.  Ms. Mondesir does not currently have medications on file.  Past Surgical History  Procedure Laterality Date  . Lumpectom left breast  02/2006  . Lymph node biopsy left  04/2006    Denies any headaches, dizziness, double vision, fevers, chills, night sweats, nausea, vomiting, diarrhea, constipation, chest pain, heart palpitations, shortness of breath, blood in stool, black tarry stool, urinary pain, urinary burning, urinary frequency, hematuria.   PHYSICAL EXAMINATION  ECOG PERFORMANCE STATUS: 0 - Asymptomatic  Filed Vitals:   05/18/12 0900  BP: 103/60  Pulse: 87  Temp: 97.4 F (36.3 C)  Resp: 14    GENERAL:alert, healthy, no distress, well nourished, well developed, comfortable, cooperative and smiling SKIN: skin color, texture, turgor are normal, no rashes or significant lesions HEAD: Normocephalic, No masses, lesions, tenderness or  abnormalities EYES: normal, Conjunctiva are pink and non-injected EARS: External ears normal OROPHARYNX:mucous membranes are moist  NECK: supple, no adenopathy, thyroid normal size, non-tender, without nodularity, no stridor, non-tender, trachea midline LYMPH:  no palpable lymphadenopathy, no hepatosplenomegaly BREAST:breasts appear normal, no suspicious masses, no skin or nipple changes or axillary nodes LUNGS: clear to auscultation and percussion HEART: regular rate & rhythm, no murmurs, no gallops, S1 normal and S2 normal ABDOMEN:abdomen soft, non-tender, normal bowel sounds, no masses or organomegaly and no hepatosplenomegaly BACK: Back symmetric, no curvature., No CVA tenderness EXTREMITIES:less then 2 second capillary refill, no joint deformities, effusion, or inflammation, no edema, no skin discoloration, no clubbing, no cyanosis  NEURO: alert & oriented x 3 with fluent speech, no focal motor/sensory deficits, gait normal   LABORATORY DATA: CBC    Component Value Date/Time   WBC 5.7 02/03/2012 1203   RBC 4.41 02/03/2012 1203   HGB 13.3 02/03/2012 1203   HCT 39.8 02/03/2012 1203   PLT 142* 02/03/2012 1203   MCV 90.2 02/03/2012 1203   MCH 30.2 02/03/2012 1203   MCHC 33.4 02/03/2012 1203   RDW 13.2 02/03/2012 1203   LYMPHSABS 1.4 07/31/2009 0840   MONOABS 0.5 07/31/2009 0840   EOSABS 0.1 07/31/2009 0840   BASOSABS 0.0 07/31/2009 0840      Chemistry      Component Value Date/Time   NA 139 06/02/2009 1206   K 3.9 06/02/2009 1206   CL 104 06/02/2009 1206   CO2 28 06/02/2009 1206   BUN 12 06/02/2009 1206   CREATININE 0.69 06/02/2009 1206      Component Value Date/Time   CALCIUM 9.3 06/02/2009 1206   ALKPHOS 67 06/02/2009 1206   AST 20 06/02/2009 1206   ALT 14 06/02/2009 1206   BILITOT 0.5 06/02/2009 1206     Lab Results  Component Value Date   IRON 61 02/03/2012   TIBC 331 02/03/2012   FERRITIN 23 02/03/2012    Lab Results  Component Value Date   FOLATE  Value: >20.0 (NOTE)   Reference Ranges        Deficient:       0.4 - 3.3 ng/mL        Indeterminate:   3.4 - 5.4 ng/mL        Normal:              > 5.4 ng/mL 06/02/2009   Lab Results  Component Value Date   VITAMINB12 344 06/02/2009     RADIOGRAPHIC STUDIES:  05/13/2012  *RADIOLOGY REPORT*  Clinical Data: Annual examination. History of left breast cancer,  status post lumpectomy in 2008.  DIGITAL DIAGNOSTIC BILATERAL MAMMOGRAM WITH CAD  Comparison: With priors  Findings:  ACR Breast Density Category 3: The breast tissue is heterogeneously  dense.  No mass, distortion, or suspicious microcalcification is identified  in either breast to suggest malignancy. There are stable  lumpectomy changes on the left.  Mammographic images were processed with CAD.  IMPRESSION:  No evidence of malignancy in either breast.  RECOMMENDATION:  Bilateral diagnostic mammogram in 1 year is recommended.  I have discussed the findings and recommendations  with the patient.  Results were also provided in writing at the conclusion of the  visit. If applicable, a reminder letter will be sent to the  patient regarding her next appointment.  BI-RADS CATEGORY 2: Benign finding(s).  Original Report Authenticated By: Britta Mccreedy, M.D.     ASSESSMENT:  1. Stage I (T1b N0), grade 1, well-differentiated adenocarcinoma of the medial lower quadrant of the left breast. It was a tubular carcinoma, status post lumpectomy on 03/13/2006, ER positive 79%, PR positive 92%, HER2/neu negative, Ki-67 marker low at 18%. Her cancer was 7 mm in size, and she had 4 sentinel nodes all negative along with her lumpectomy. She was treated with radiation therapy postoperatively and opted not to take further therapy. She is here for routine followup.  2. Iron deficiency anemia due to excessive GU bleeding which has now stabilized on oral iron twice a day. Ferritin has risen from 11 now to 23 most recently.  3. Right preauricular skin cancer removed years  ago.  4. Allergic rhinitis.  5. History of tick bite in 2013    PLAN:  1. I personally reviewed and went over laboratory results with the patient. 2. I personally reviewed and went over radiographic studies with the patient. 3. Lab work today: CBC diff, CMET, Iron/TIBC, Ferritin 4. Labs in 6 months: CBC diff, Iron/TIBC, Ferritin 5. Labs in 1 year: CBC diff, CMET, Iron/TIBC, Ferritin 6. Next screening mammogram is due in April 2015 7. Continue with PO ferrous sulfate BID  8. Return in 1 year for follow-up.    All questions were answered. The patient knows to call the clinic with any problems, questions or concerns. We can certainly see the patient much sooner if necessary.  The patient and plan discussed with Glenford Peers, MD and he is in agreement with the aforementioned.  KEFALAS,THOMAS

## 2012-05-18 ENCOUNTER — Encounter (HOSPITAL_COMMUNITY): Payer: Self-pay | Admitting: Oncology

## 2012-05-18 ENCOUNTER — Encounter (HOSPITAL_COMMUNITY): Payer: BC Managed Care – PPO | Attending: Oncology | Admitting: Oncology

## 2012-05-18 VITALS — BP 103/60 | HR 87 | Temp 97.4°F | Resp 14 | Wt 95.6 lb

## 2012-05-18 DIAGNOSIS — C50919 Malignant neoplasm of unspecified site of unspecified female breast: Secondary | ICD-10-CM

## 2012-05-18 DIAGNOSIS — Z170 Estrogen receptor positive status [ER+]: Secondary | ICD-10-CM

## 2012-05-18 DIAGNOSIS — Z853 Personal history of malignant neoplasm of breast: Secondary | ICD-10-CM | POA: Insufficient documentation

## 2012-05-18 DIAGNOSIS — D509 Iron deficiency anemia, unspecified: Secondary | ICD-10-CM | POA: Insufficient documentation

## 2012-05-18 DIAGNOSIS — Z85828 Personal history of other malignant neoplasm of skin: Secondary | ICD-10-CM

## 2012-05-18 DIAGNOSIS — C50912 Malignant neoplasm of unspecified site of left female breast: Secondary | ICD-10-CM

## 2012-05-18 DIAGNOSIS — Z09 Encounter for follow-up examination after completed treatment for conditions other than malignant neoplasm: Secondary | ICD-10-CM | POA: Insufficient documentation

## 2012-05-18 DIAGNOSIS — C50319 Malignant neoplasm of lower-inner quadrant of unspecified female breast: Secondary | ICD-10-CM

## 2012-05-18 LAB — DIFFERENTIAL
Basophils Absolute: 0 10*3/uL (ref 0.0–0.1)
Basophils Relative: 0 % (ref 0–1)
Eosinophils Relative: 4 % (ref 0–5)
Monocytes Absolute: 0.4 10*3/uL (ref 0.1–1.0)

## 2012-05-18 LAB — CBC
Hemoglobin: 13.3 g/dL (ref 12.0–15.0)
MCH: 30.4 pg (ref 26.0–34.0)
MCV: 90.6 fL (ref 78.0–100.0)
RBC: 4.38 MIL/uL (ref 3.87–5.11)

## 2012-05-18 LAB — COMPREHENSIVE METABOLIC PANEL
AST: 20 U/L (ref 0–37)
Albumin: 4.3 g/dL (ref 3.5–5.2)
Calcium: 9.6 mg/dL (ref 8.4–10.5)
Creatinine, Ser: 0.71 mg/dL (ref 0.50–1.10)
GFR calc non Af Amer: >90 mL/min (ref >90)

## 2012-05-18 LAB — IRON AND TIBC
Iron: 73 ug/dL (ref 42–135)
UIBC: 241 ug/dL (ref 125–400)

## 2012-05-18 NOTE — Patient Instructions (Addendum)
[facility] Discharge Instructions  RECOMMENDATIONS MADE BY THE CONSULTANT AND ANY TEST RESULTS WILL BE SENT TO YOUR REFERRING PHYSICIAN.  MEDICATIONS PRESCRIBED:  None Continue Oral Iron tablets twice daily.   INSTRUCTIONS GIVEN AND DISCUSSED: Labs today Labs in 6 months and 1 year to monitor iron.   SPECIAL INSTRUCTIONS/FOLLOW-UP: Next mammogram is due in April 2015. Return in 1 year for follow-up  Thank you for choosing [facility] to provide your oncology and hematology care.  To afford each patient quality time with our providers, please arrive at least 15 minutes before your scheduled appointment time.  With your help, our goal is to use those 15 minutes to complete the necessary work-up to ensure our physicians have the information they need to help with your evaluation and healthcare recommendations.    Effective January 1st, 2014, we ask that you re-schedule your appointment with our physicians should you arrive 10 or more minutes late for your appointment.  We strive to give you quality time with our providers, and arriving late affects you and other patients whose appointments are after yours.    Again, thank you for choosing [facility].  Our hope is that these requests will decrease the amount of time that you wait before being seen by our physicians.       _____________________________________________________________  Should you have questions after your visit to [facility], please contact our office at [phone] between the hours of 8:30 a.m. and 5:00 p.m.  Voicemails left after 4:30 p.m. will not be returned until the following business day.  For prescription refill requests, have your pharmacy contact our office with your prescription refill request.

## 2012-10-31 ENCOUNTER — Encounter: Payer: Self-pay | Admitting: *Deleted

## 2012-11-04 ENCOUNTER — Ambulatory Visit (INDEPENDENT_AMBULATORY_CARE_PROVIDER_SITE_OTHER): Payer: BC Managed Care – PPO | Admitting: Nurse Practitioner

## 2012-11-04 ENCOUNTER — Encounter: Payer: Self-pay | Admitting: Nurse Practitioner

## 2012-11-04 VITALS — BP 98/64 | HR 70 | Ht 63.5 in | Wt 95.2 lb

## 2012-11-04 DIAGNOSIS — D509 Iron deficiency anemia, unspecified: Secondary | ICD-10-CM

## 2012-11-04 DIAGNOSIS — Z0000 Encounter for general adult medical examination without abnormal findings: Secondary | ICD-10-CM

## 2012-11-04 DIAGNOSIS — Z01419 Encounter for gynecological examination (general) (routine) without abnormal findings: Secondary | ICD-10-CM

## 2012-11-08 ENCOUNTER — Encounter: Payer: Self-pay | Admitting: Nurse Practitioner

## 2012-11-08 NOTE — Progress Notes (Signed)
  Subjective:    Patient ID: Melissa Rodriguez, female    DOB: 1965/04/23, 47 y.o.   MRN: [id number]  HPI  presents for her wellness exam. Regular dental and eye exams. Same sexual partner. No pelvic pain. Had a menstrual cycle in June, some off and on light spotting. Has been very irregular. Declines flu vaccine. Is due for her routine blood work from oncology/hematology in early November, would like for [place] to order it.    Review of Systems  Constitutional: Negative for fever, activity change, appetite change, fatigue and unexpected weight change.  HENT: Negative for dental problem, ear pain, hearing loss, postnasal drip, rhinorrhea and sore throat.   Eyes: Negative for visual disturbance.  Respiratory: Negative for cough, chest tightness, shortness of breath and wheezing.   Cardiovascular: Negative for chest pain.  Gastrointestinal: Negative for nausea, vomiting, abdominal pain, diarrhea, constipation and blood in stool.  Genitourinary: Negative for dysuria, urgency, frequency, vaginal discharge, difficulty urinating, menstrual problem and pelvic pain.  Psychiatric/Behavioral: Negative for sleep disturbance and dysphoric mood. The patient is not nervous/anxious.        Objective:   Physical Exam  Vitals reviewed. Constitutional: She is oriented to person, place, and time. She appears well-developed. No distress.  HENT:  Right Ear: External ear normal.  Left Ear: External ear normal.  Mouth/Throat: Oropharynx is clear and moist.  Neck: Normal range of motion. Neck supple. No tracheal deviation present. No thyromegaly present.  Cardiovascular: Normal rate, regular rhythm and normal heart sounds.  Exam reveals no gallop.   No murmur heard. Pulmonary/Chest: Effort normal and breath sounds normal.  Abdominal: Soft. She exhibits no distension and no mass. There is no tenderness.  Genitourinary: Vagina normal and uterus normal. No vaginal discharge found.  Musculoskeletal: She exhibits no  edema.  Lymphadenopathy:    She has no cervical adenopathy.  Neurological: She is alert and oriented to person, place, and time.  Skin: Skin is warm and dry. No rash noted.  Psychiatric: She has a normal mood and affect. Her behavior is normal.   breast exam: Minimal breast tissue. No obvious masses. Exuberant no adenopathy. External GU normal. Bimanual exam no masses or tenderness noted.        Assessment & Plan:  Well woman exam  Routine general medical examination at a health care facility - Plan: TSH, Lipid panel  Iron deficiency anemia - Plan: CBC with Differential, Ferritin, Iron and TIBC  Recommend vitamin D and calcium daily. Continue regular activity. Next physical in one year.

## 2012-11-18 ENCOUNTER — Encounter (HOSPITAL_COMMUNITY): Payer: BC Managed Care – PPO | Attending: Hematology and Oncology

## 2012-11-18 DIAGNOSIS — C50912 Malignant neoplasm of unspecified site of left female breast: Secondary | ICD-10-CM

## 2012-11-18 DIAGNOSIS — C50319 Malignant neoplasm of lower-inner quadrant of unspecified female breast: Secondary | ICD-10-CM

## 2012-11-18 DIAGNOSIS — D509 Iron deficiency anemia, unspecified: Secondary | ICD-10-CM

## 2012-11-18 LAB — CBC WITH DIFFERENTIAL/PLATELET
Eosinophils Absolute: 0.1 10*3/uL (ref 0.0–0.7)
Hemoglobin: 14.6 g/dL (ref 12.0–15.0)
Lymphocytes Relative: 28 % (ref 12–46)
Lymphs Abs: 1.6 10*3/uL (ref 0.7–4.0)
MCH: 30.1 pg (ref 26.0–34.0)
MCHC: 32.9 g/dL (ref 30.0–36.0)
Monocytes Relative: 6 % (ref 3–12)
Neutro Abs: 3.7 10*3/uL (ref 1.7–7.7)
Neutrophils Relative %: 65 % (ref 43–77)
Platelets: 150 10*3/uL (ref 150–400)
RBC: 4.85 MIL/uL (ref 3.87–5.11)
WBC: 5.6 10*3/uL (ref 4.0–10.5)

## 2012-11-18 LAB — IRON AND TIBC
Iron: 113 ug/dL (ref 42–135)
Saturation Ratios: 39 % (ref 20–55)
TIBC: 292 ug/dL (ref 250–470)
UIBC: 179 ug/dL (ref 125–400)

## 2012-11-18 LAB — FERRITIN: Ferritin: 59 ng/mL (ref 10–291)

## 2012-11-18 NOTE — Progress Notes (Signed)
Labs drawn today for cbc/diff,Iron and IBC,ferr 

## 2013-05-03 ENCOUNTER — Other Ambulatory Visit: Payer: Self-pay | Admitting: Family Medicine

## 2013-05-03 DIAGNOSIS — C50919 Malignant neoplasm of unspecified site of unspecified female breast: Secondary | ICD-10-CM

## 2013-05-17 ENCOUNTER — Encounter (HOSPITAL_COMMUNITY): Payer: BC Managed Care – PPO | Attending: Hematology and Oncology

## 2013-05-17 DIAGNOSIS — D509 Iron deficiency anemia, unspecified: Secondary | ICD-10-CM | POA: Insufficient documentation

## 2013-05-17 DIAGNOSIS — D696 Thrombocytopenia, unspecified: Secondary | ICD-10-CM | POA: Insufficient documentation

## 2013-05-17 DIAGNOSIS — C50919 Malignant neoplasm of unspecified site of unspecified female breast: Secondary | ICD-10-CM | POA: Insufficient documentation

## 2013-05-17 DIAGNOSIS — C50912 Malignant neoplasm of unspecified site of left female breast: Secondary | ICD-10-CM

## 2013-05-17 DIAGNOSIS — C50319 Malignant neoplasm of lower-inner quadrant of unspecified female breast: Secondary | ICD-10-CM

## 2013-05-17 LAB — COMPREHENSIVE METABOLIC PANEL
ALK PHOS: 57 U/L (ref 39–117)
ALT: 12 U/L (ref 0–35)
AST: 20 U/L (ref 0–37)
Albumin: 4 g/dL (ref 3.5–5.2)
BUN: 10 mg/dL (ref 6–23)
CO2: 32 mEq/L (ref 19–32)
Calcium: 9.9 mg/dL (ref 8.4–10.5)
Chloride: 101 mEq/L (ref 96–112)
Creatinine, Ser: 0.77 mg/dL (ref 0.50–1.10)
GLUCOSE: 97 mg/dL (ref 70–99)
Potassium: 3.9 mEq/L (ref 3.7–5.3)
Sodium: 141 mEq/L (ref 137–147)
TOTAL PROTEIN: 6.8 g/dL (ref 6.0–8.3)
Total Bilirubin: 0.6 mg/dL (ref 0.3–1.2)

## 2013-05-17 LAB — CBC WITH DIFFERENTIAL/PLATELET
Basophils Absolute: 0 10*3/uL (ref 0.0–0.1)
Basophils Relative: 0 % (ref 0–1)
EOS ABS: 0.3 10*3/uL (ref 0.0–0.7)
Eosinophils Relative: 3 % (ref 0–5)
HCT: 40.2 % (ref 36.0–46.0)
HEMOGLOBIN: 13.6 g/dL (ref 12.0–15.0)
LYMPHS ABS: 1.2 10*3/uL (ref 0.7–4.0)
Lymphocytes Relative: 13 % (ref 12–46)
MCH: 30.6 pg (ref 26.0–34.0)
MCHC: 33.8 g/dL (ref 30.0–36.0)
MCV: 90.5 fL (ref 78.0–100.0)
Monocytes Absolute: 0.6 10*3/uL (ref 0.1–1.0)
Monocytes Relative: 6 % (ref 3–12)
Neutro Abs: 7.4 10*3/uL (ref 1.7–7.7)
Neutrophils Relative %: 78 % — ABNORMAL HIGH (ref 43–77)
Platelets: 120 10*3/uL — ABNORMAL LOW (ref 150–400)
RBC: 4.44 MIL/uL (ref 3.87–5.11)
RDW: 13 % (ref 11.5–15.5)
WBC: 9.4 10*3/uL (ref 4.0–10.5)

## 2013-05-17 LAB — IRON AND TIBC
Iron: 39 ug/dL — ABNORMAL LOW (ref 42–135)
Saturation Ratios: 16 % — ABNORMAL LOW (ref 20–55)
TIBC: 242 ug/dL — ABNORMAL LOW (ref 250–470)
UIBC: 203 ug/dL (ref 125–400)

## 2013-05-17 LAB — FERRITIN: Ferritin: 95 ng/mL (ref 10–291)

## 2013-05-17 NOTE — Progress Notes (Signed)
Labs drawn today for cbc/diff,cmp,Iron and Ferr

## 2013-05-18 ENCOUNTER — Ambulatory Visit (HOSPITAL_COMMUNITY): Payer: BC Managed Care – PPO

## 2013-05-19 ENCOUNTER — Encounter (HOSPITAL_COMMUNITY): Payer: BC Managed Care – PPO

## 2013-05-20 ENCOUNTER — Encounter: Payer: Self-pay | Admitting: Family Medicine

## 2013-05-20 ENCOUNTER — Ambulatory Visit (INDEPENDENT_AMBULATORY_CARE_PROVIDER_SITE_OTHER): Payer: BC Managed Care – PPO | Admitting: Family Medicine

## 2013-05-20 VITALS — BP 122/70 | Temp 98.8°F | Ht 63.5 in | Wt 93.4 lb

## 2013-05-20 DIAGNOSIS — J069 Acute upper respiratory infection, unspecified: Secondary | ICD-10-CM

## 2013-05-20 MED ORDER — AZITHROMYCIN 250 MG PO TABS: ORAL_TABLET | ORAL | Status: DC

## 2013-05-20 NOTE — Patient Instructions (Signed)
If worse follow up

## 2013-05-20 NOTE — Progress Notes (Signed)
   Subjective:    Patient ID: Melissa Rodriguez, female    DOB: December 07, 1965, 48 y.o.   MRN: [id number]  Cough This is a new problem. The current episode started in the past 7 days. Associated symptoms include a fever, rhinorrhea and a sore throat. Pertinent negatives include no chest pain, ear pain, shortness of breath or wheezing. Associated symptoms comments: congestion. She has tried nothing for the symptoms.   Started Sun Fever Mon sinus  coughi ng chest congest No wheeze No V   Review of Systems  Constitutional: Positive for fever. Negative for activity change.  HENT: Positive for congestion, rhinorrhea and sore throat. Negative for ear pain.   Eyes: Negative for discharge.  Respiratory: Positive for cough. Negative for shortness of breath and wheezing.   Cardiovascular: Negative for chest pain.       Objective:   Physical Exam  Nursing note and vitals reviewed. Constitutional: She appears well-developed.  HENT:  Head: Normocephalic.  Nose: Nose normal.  Mouth/Throat: Oropharynx is clear and moist. No oropharyngeal exudate.  Neck: Neck supple.  Cardiovascular: Normal rate and normal heart sounds.   No murmur heard. Pulmonary/Chest: Effort normal and breath sounds normal. She has no wheezes.  Lymphadenopathy:    She has no cervical adenopathy.  Skin: Skin is warm and dry.          Assessment & Plan:  1. Viral upper respiratory illness I feel that this is a viral illness should gradually get better but if it does not I did give her prescription antibiotics to take should her sinuses start giving her trouble or should she start worsening warning signs was discussed

## 2013-05-22 DIAGNOSIS — D696 Thrombocytopenia, unspecified: Secondary | ICD-10-CM | POA: Insufficient documentation

## 2013-05-31 ENCOUNTER — Encounter (HOSPITAL_COMMUNITY): Payer: Self-pay

## 2013-05-31 ENCOUNTER — Encounter (HOSPITAL_BASED_OUTPATIENT_CLINIC_OR_DEPARTMENT_OTHER): Payer: BC Managed Care – PPO

## 2013-05-31 VITALS — BP 91/58 | HR 75 | Temp 97.9°F | Resp 14 | Wt 93.3 lb

## 2013-05-31 DIAGNOSIS — D693 Immune thrombocytopenic purpura: Secondary | ICD-10-CM

## 2013-05-31 DIAGNOSIS — D509 Iron deficiency anemia, unspecified: Secondary | ICD-10-CM

## 2013-05-31 DIAGNOSIS — C50919 Malignant neoplasm of unspecified site of unspecified female breast: Secondary | ICD-10-CM

## 2013-05-31 DIAGNOSIS — C50319 Malignant neoplasm of lower-inner quadrant of unspecified female breast: Secondary | ICD-10-CM

## 2013-05-31 DIAGNOSIS — D696 Thrombocytopenia, unspecified: Secondary | ICD-10-CM

## 2013-05-31 NOTE — Progress Notes (Signed)
[facility]  OFFICE PROGRESS NOTE  LUKING,SCOTT, MD [place] [address]  DIAGNOSIS: Thrombocytopenia - Plan: ANA, Plt glycoprotein (indirect) autoabs, Platelet antibodies, direct, Vitamin B12, Folate, Cardiolipin antibody  Adenocarcinoma of breast  Iron deficiency anemia  Chief Complaint  Patient presents with  . Breast Cancer  . Iron deficiency    CURRENT THERAPY: Watchful expectation and surveillance  INTERVAL HISTORY: Melissa Rodriguez 48 y.o. female returns for followup of stage I left breast cancer, status post lumpectomy, sentinel node biopsy, radiotherapy, ER/PR positive, refusing any further treatment with original surgery on 03/13/2006. Concurrent history of iron deficiency taking iron supplements twice a day. She has not had a menstrual period for one year. She denies any melena, hematochezia, hematuria, epistaxis, or hemoptysis. She continues to take iron twice a day. She denies any fever, night sweats, or hot flashes. She also denies any vaginal bleeding, nocturia, incontinence, urinary hesitancy, lower extremity swelling or redness, lymphedema, skin rash, cough, nasal drip, epistaxis, easy bruising, headache, or seizures. She recently refused tamoxifen therapy because of secondary risk of cancer in the uterus which a member of her family had experienced.  MEDICAL HISTORY: Past Medical History  Diagnosis Date  . Breast cancer   . Iron deficiency   . Adenocarcinoma of breast 05/20/2011    Stage I (T1b N0), grade 1, well-differentiated adenocarcinoma of the medial lower quadrant of the left breast. It was a tubular carcinoma, status post lumpectomy on 03/13/2006, ER positive 79%, PR positive 92%, HER2/neu negative, Ki-67 marker low at 18%. Her cancer was 7 mm in size, and she had 4 sentinel nodes all negative along with her lumpectomy. She was treated with radiation therapy postoper  . Iron deficiency anemia  05/20/2011    Secondary to GU bleeding.  Good absorber of PO iron    INTERIM HISTORY: has Adenocarcinoma of breast; Iron deficiency anemia; and Thrombocytopenia on her problem list.   Stage I (T1b N0), grade 1, well-differentiated adenocarcinoma of the medial lower quadrant of the left breast. It was a tubular carcinoma, status post lumpectomy on 03/13/2006, ER positive 79%, PR positive 92%, HER2/neu negative, Ki-67 marker low at 18%. Her cancer was 7 mm in size, and she had 4 sentinel nodes all negative along with her lumpectomy. She was treated with radiation therapy postoperatively and opted not to take further therapy  ALLERGIES:  is allergic to benadryl; ciprofloxacin; and penicillins.  MEDICATIONS: has a current medication list which includes the following prescription(s): ascorbic acid, azithromycin, calcium carbonate antacid, cholecalciferol, iron, loratadine-pseudoephedrine, multivitamin, and probiotic product.  SURGICAL HISTORY:  Past Surgical History  Procedure Laterality Date  . Lumpectom left breast  02/2006  . Lymph node biopsy left  04/2006    FAMILY HISTORY: family history includes Cancer in her maternal aunt and paternal uncle; Heart disease (age of onset: 29) in her father; Hypertension in her father and mother.  SOCIAL HISTORY:  reports that she has never smoked. She has never used smokeless tobacco. She reports that she does not drink alcohol or use illicit drugs.  REVIEW OF SYSTEMS:  Other than that discussed above is noncontributory.  PHYSICAL EXAMINATION: ECOG PERFORMANCE STATUS: 0 - Asymptomatic  There were no vitals taken for this visit.  GENERAL:alert, no distress and comfortable SKIN: skin color, texture, turgor are normal, no rashes or significant lesions EYES: PERLA; Conjunctiva are pink and non-injected, sclera clear SINUSES: No redness or tenderness over maxillary  or ethmoid sinuses OROPHARYNX:no exudate, no erythema on lips, buccal mucosa, or  tongue. NECK: supple, thyroid normal size, non-tender, without nodularity. No masses CHEST: Status post left breast lumpectomy with no masses in either breast. LYMPH:  no palpable lymphadenopathy in the cervical, axillary or inguinal LUNGS: clear to auscultation and percussion with normal breathing effort HEART: regular rate & rhythm and no murmurs. ABDOMEN:abdomen soft, non-tender and normal bowel sounds MUSCULOSKELETAL:no cyanosis of digits and no clubbing. Range of motion normal.  NEURO: alert & oriented x 3 with fluent speech, no focal motor/sensory deficits   LABORATORY DATA: Infusion on 05/17/2013  Component Date Value Ref Range Status  . WBC 05/17/2013 9.4  4.0 - 10.5 K/uL Final  . RBC 05/17/2013 4.44  3.87 - 5.11 MIL/uL Final  . Hemoglobin 05/17/2013 13.6  12.0 - 15.0 g/dL Final  . HCT 05/17/2013 40.2  36.0 - 46.0 % Final  . MCV 05/17/2013 90.5  78.0 - 100.0 fL Final  . MCH 05/17/2013 30.6  26.0 - 34.0 pg Final  . MCHC 05/17/2013 33.8  30.0 - 36.0 g/dL Final  . RDW 05/17/2013 13.0  11.5 - 15.5 % Final  . Platelets 05/17/2013 120* 150 - 400 K/uL Final  . Neutrophils Relative % 05/17/2013 78* 43 - 77 % Final  . Neutro Abs 05/17/2013 7.4  1.7 - 7.7 K/uL Final  . Lymphocytes Relative 05/17/2013 13  12 - 46 % Final  . Lymphs Abs 05/17/2013 1.2  0.7 - 4.0 K/uL Final  . Monocytes Relative 05/17/2013 6  3 - 12 % Final  . Monocytes Absolute 05/17/2013 0.6  0.1 - 1.0 K/uL Final  . Eosinophils Relative 05/17/2013 3  0 - 5 % Final  . Eosinophils Absolute 05/17/2013 0.3  0.0 - 0.7 K/uL Final  . Basophils Relative 05/17/2013 0  0 - 1 % Final  . Basophils Absolute 05/17/2013 0.0  0.0 - 0.1 K/uL Final  . Sodium 05/17/2013 141  137 - 147 mEq/L Final  . Potassium 05/17/2013 3.9  3.7 - 5.3 mEq/L Final  . Chloride 05/17/2013 101  96 - 112 mEq/L Final  . CO2 05/17/2013 32  19 - 32 mEq/L Final  . Glucose, Bld 05/17/2013 97  70 - 99 mg/dL Final  . BUN 05/17/2013 10  6 - 23 mg/dL Final  .  Creatinine, Ser 05/17/2013 0.77  0.50 - 1.10 mg/dL Final  . Calcium 05/17/2013 9.9  8.4 - 10.5 mg/dL Final  . Total Protein 05/17/2013 6.8  6.0 - 8.3 g/dL Final  . Albumin 05/17/2013 4.0  3.5 - 5.2 g/dL Final  . AST 05/17/2013 20  0 - 37 U/L Final  . ALT 05/17/2013 12  0 - 35 U/L Final  . Alkaline Phosphatase 05/17/2013 57  39 - 117 U/L Final  . Total Bilirubin 05/17/2013 0.6  0.3 - 1.2 mg/dL Final  . GFR calc non Af Amer 05/17/2013 >90  >90 mL/min Final  . GFR calc Af Amer 05/17/2013 >90  >90 mL/min Final   Comment: (NOTE)                          The eGFR has been calculated using the CKD EPI equation.                          This calculation has not been validated in all clinical situations.  eGFR's persistently <90 mL/min signify possible Chronic Kidney                          Disease.  . Iron 05/17/2013 39* 42 - 135 ug/dL Final  . TIBC 05/17/2013 242* 250 - 470 ug/dL Final  . Saturation Ratios 05/17/2013 16* 20 - 55 % Final  . UIBC 05/17/2013 203  125 - 400 ug/dL Final   Performed at [facility]  . Ferritin 05/17/2013 95  10 - 291 ng/mL Final   Performed at [facility]: No new pathology.  Urinalysis No results found for this basename: colorurine, appearanceur, labspec, phurine, glucoseu, hgbur, bilirubinur, ketonesur, proteinur, urobilinogen, nitrite, leukocytesur    RADIOGRAPHIC STUDIES: Mammogram is scheduled for 06/09/2013.  ASSESSMENT:  1. Stage I (T1b N0), grade 1, well-differentiated adenocarcinoma of the medial lower quadrant of the left breast. It was a tubular carcinoma, status post lumpectomy on 03/13/2006, ER positive 79%, PR positive 92%, HER2/neu negative, Ki-67 marker low at 18%. Her cancer was 7 mm in size, and she had 4 sentinel nodes all negative along with her lumpectomy. She was treated with radiation therapy postoperatively and opted not to take further therapy because of the fear of secondary cancer in  the uterus which occurred in one of her family members.  #2. Iron deficiency, improving ferritin with no current ongoing blood losses the patient is postmenopausal. #3. Allergic rhinitis, minimal symptomatic on treatment, with low platelet count as a result as well. #4. Immune thrombocytopenia worse during allergy season. #5. History of tick bite 2013, no evidence of long-term sequelae. #6. Right preauricular skin cancer, status post resection years ago, no evidence of recurrence.  PLAN:  #1. Patient was introduced to the use of an aromatase inhibitor to increase overall survival and decrease risk of secondary cancer in the opposite breast. She was told the drug does not cause secondary cancer in the uterus. It may cause osteopenia due to its effect on the bone matrix. She was given information about the drug and will have a DEXA scan baseline if she decides to utilize it. #2. Followup will be in one month if the patient decides to go on anastrozole. If not, one year. #3. Patient was told to decrease iron supplements to just one daily.   All questions were answered. The patient knows to call the clinic with any problems, questions or concerns. We can certainly see the patient much sooner if necessary.   I spent 25 minutes counseling the patient face to face. The total time spent in the appointment was 30 minutes.    Farrel Gobble, MD 05/31/2013 8:30 AM  DISCLAIMER:  This note was dictated with voice recognition software.  Similar sounding words can inadvertently be transcribed inaccurately and may not be corrected upon review.

## 2013-05-31 NOTE — Addendum Note (Signed)
Addended by: Mellissa Kohut on: 05/31/2013 10:55 AM   Modules accepted: Orders

## 2013-05-31 NOTE — Patient Instructions (Signed)
[facility] Discharge Instructions  RECOMMENDATIONS MADE BY THE CONSULTANT AND ANY TEST RESULTS WILL BE SENT TO YOUR REFERRING PHYSICIAN.  EXAM FINDINGS BY THE PHYSICIAN TODAY AND SIGNS OR SYMPTOMS TO REPORT TO CLINIC OR PRIMARY PHYSICIAN: Exam and findings as discussed by Dr. Barnet Glasgow.  If you decide to take the arimidex, let us know.  We will need to do a bone density study and see you back sooner. Report any new lumps, bone pain, shortness of breath or other symptoms.  MEDICATIONS PRESCRIBED:  none  INSTRUCTIONS/FOLLOW-UP: Follow-up in 1 year with labs and office visit.  Thank you for choosing [facility] to provide your oncology and hematology care.  To afford each patient quality time with our providers, please arrive at least 15 minutes before your scheduled appointment time.  With your help, our goal is to use those 15 minutes to complete the necessary work-up to ensure our physicians have the information they need to help with your evaluation and healthcare recommendations.    Effective January 1st, 2014, we ask that you re-schedule your appointment with our physicians should you arrive 10 or more minutes late for your appointment.  We strive to give you quality time with our providers, and arriving late affects you and other patients whose appointments are after yours.    Again, thank you for choosing [facility].  Our hope is that these requests will decrease the amount of time that you wait before being seen by our physicians.       _____________________________________________________________  Should you have questions after your visit to [facility], please contact our office at [phone] between the hours of 8:30 a.m. and 5:00 p.m.  Voicemails left after 4:30 p.m. will not be returned until the following business day.  For prescription refill requests, have your pharmacy contact our office with your prescription  refill request.     Anastrozole tablets What is this medicine? ANASTROZOLE (an AS troe zole) is used to treat breast cancer in women who have gone through menopause. Some types of breast cancer depend on estrogen to grow, and this medicine can stop tumor growth by blocking estrogen production. This medicine may be used for other purposes; ask your health care provider or pharmacist if you have questions. COMMON BRAND NAME(S): Arimidex What should I tell my health care provider before I take this medicine? They need to know if you have any of these conditions: -liver disease -an unusual or allergic reaction to anastrozole, other medicines, foods, dyes, or preservatives -pregnant or trying to get pregnant -breast-feeding How should I use this medicine? Take this medicine by mouth with a glass of water. Follow the directions on the prescription label. You can take this medicine with or without food. Take your doses at regular intervals. Do not take your medicine more often than directed. Do not stop taking except on the advice of your doctor or health care professional. Talk to your pediatrician regarding the use of this medicine in children. Special care may be needed. Overdosage: If you think you have taken too much of this medicine contact a poison control center or emergency room at once. NOTE: This medicine is only for you. Do not share this medicine with others. What if I miss a dose? If you miss a dose, take it as soon as you can. If it is almost time for your next dose, take only that dose. Do not take double or extra doses.  What may interact with this medicine? Do not take this medicine with any of the following medications: -female hormones, like estrogens or progestins and birth control pills This medicine may also interact with the following medications: -tamoxifen This list may not describe all possible interactions. Give your health care provider a list of all the medicines,  herbs, non-prescription drugs, or dietary supplements you use. Also tell them if you smoke, drink alcohol, or use illegal drugs. Some items may interact with your medicine. What should I watch for while using this medicine? Visit your doctor or health care professional for regular checks on your progress. Let your doctor or health care professional know about any unusual vaginal bleeding. Do not treat yourself for diarrhea, nausea, vomiting or other side effects. Ask your doctor or health care professional for advice. What side effects may I notice from receiving this medicine? Side effects that you should report to your doctor or health care professional as soon as possible: -allergic reactions like skin rash, itching or hives, swelling of the face, lips, or tongue -any new or unusual symptoms -breathing problems -chest pain -leg pain or swelling -vomiting Side effects that usually do not require medical attention (report to your doctor or health care professional if they continue or are bothersome): -back or bone pain -cough, or throat infection -diarrhea or constipation -dizziness -headache -hot flashes -loss of appetite -nausea -sweating -weakness and tiredness -weight gain This list may not describe all possible side effects. Call your doctor for medical advice about side effects. You may report side effects to FDA at 1-800-FDA-1088. Where should I keep my medicine? Keep out of the reach of children. Store at room temperature between 20 and 25 degrees C (68 and 77 degrees F). Throw away any unused medicine after the expiration date. NOTE: This sheet is a summary. It may not cover all possible information. If you have questions about this medicine, talk to your doctor, pharmacist, or health care provider.  2014, Elsevier/Gold Standard. (2007-03-13 16:31:52)

## 2013-06-09 ENCOUNTER — Ambulatory Visit (HOSPITAL_COMMUNITY)
Admission: RE | Admit: 2013-06-09 | Discharge: 2013-06-09 | Disposition: A | Discharge status: Home / Self Care | Payer: BC Managed Care – PPO | Source: Ambulatory Visit | Attending: Family Medicine | Admitting: Family Medicine

## 2013-06-09 DIAGNOSIS — Z853 Personal history of malignant neoplasm of breast: Secondary | ICD-10-CM | POA: Insufficient documentation

## 2013-06-09 DIAGNOSIS — C50919 Malignant neoplasm of unspecified site of unspecified female breast: Secondary | ICD-10-CM

## 2013-06-09 DIAGNOSIS — Z09 Encounter for follow-up examination after completed treatment for conditions other than malignant neoplasm: Secondary | ICD-10-CM | POA: Insufficient documentation

## 2013-08-17 ENCOUNTER — Ambulatory Visit (INDEPENDENT_AMBULATORY_CARE_PROVIDER_SITE_OTHER): Payer: BC Managed Care – PPO | Admitting: Family Medicine

## 2013-08-17 ENCOUNTER — Encounter: Payer: Self-pay | Admitting: Family Medicine

## 2013-08-17 VITALS — BP 96/64 | Temp 98.7°F | Ht 63.0 in | Wt 92.4 lb

## 2013-08-17 DIAGNOSIS — J329 Chronic sinusitis, unspecified: Secondary | ICD-10-CM

## 2013-08-17 DIAGNOSIS — J310 Chronic rhinitis: Secondary | ICD-10-CM

## 2013-08-17 MED ORDER — AZITHROMYCIN 250 MG PO TABS: ORAL_TABLET | ORAL | Status: DC

## 2013-08-17 NOTE — Progress Notes (Signed)
   Subjective:    Patient ID: Melissa Rodriguez, female    DOB: [birth year], 48 y.o.   MRN: [id number]  Sore Throat  This is a new problem. The current episode started in the past 7 days. Associated symptoms include coughing and ear pain.   Sore throt started  Ear pain and congestion  Tickle cough  Right ear  No fever, diminished enrgy Right mainly    otc symptom none yer   Review of Systems  HENT: Positive for ear pain.   Respiratory: Positive for cough.        Objective:   Physical Exam Alert mild malaise. TMs significant wax nasal congestion frontal tenderness pharynx erythematous neck supple lungs clear. Heart regular in rhythm.       Assessment & Plan:  Impression rhinosinusitis plan antibiotics prescribed. Symptomatic care discussed. WSL

## 2013-09-10 ENCOUNTER — Encounter: Payer: Self-pay | Admitting: Family Medicine

## 2013-09-10 ENCOUNTER — Ambulatory Visit (INDEPENDENT_AMBULATORY_CARE_PROVIDER_SITE_OTHER): Payer: BC Managed Care – PPO | Admitting: Family Medicine

## 2013-09-10 VITALS — BP 98/64 | Temp 97.9°F | Ht 63.0 in | Wt 93.0 lb

## 2013-09-10 DIAGNOSIS — S80861A Insect bite (nonvenomous), right lower leg, initial encounter: Secondary | ICD-10-CM

## 2013-09-10 DIAGNOSIS — W57XXXA Bitten or stung by nonvenomous insect and other nonvenomous arthropods, initial encounter: Principal | ICD-10-CM

## 2013-09-10 DIAGNOSIS — S90569A Insect bite (nonvenomous), unspecified ankle, initial encounter: Secondary | ICD-10-CM

## 2013-09-10 NOTE — Patient Instructions (Signed)
Tick Bite Information Ticks are insects that attach themselves to the skin and draw blood for food. There are various types of ticks. Common types include wood ticks and deer ticks. Most ticks live in shrubs and grassy areas. Ticks can climb onto your body when you make contact with leaves or grass where the tick is waiting. The most common places on the body for ticks to attach themselves are the scalp, neck, armpits, waist, and groin. Most tick bites are harmless, but sometimes ticks carry germs that cause diseases. These germs can be spread to a person during the tick's feeding process. The chance of a disease spreading through a tick bite depends on:   The type of tick.  Time of year.   How long the tick is attached.   Geographic location.  HOW CAN YOU PREVENT TICK BITES? Take these steps to help prevent tick bites when you are outdoors:  Wear protective clothing. Long sleeves and long pants are best.   Wear white clothes so you can see ticks more easily.  Tuck your pant legs into your socks.   If walking on a trail, stay in the middle of the trail to avoid brushing against bushes.  Avoid walking through areas with long grass.  Put insect repellent on all exposed skin and along boot tops, pant legs, and sleeve cuffs.   Check clothing, hair, and skin repeatedly and before going inside.   Brush off any ticks that are not attached.  Take a shower or bath as soon as possible after being outdoors.  WHAT IS THE PROPER WAY TO REMOVE A TICK? Ticks should be removed as soon as possible to help prevent diseases caused by tick bites. 1. If latex gloves are available, put them on before trying to remove a tick.  2. Using fine-point tweezers, grasp the tick as close to the skin as possible. You may also use curved forceps or a tick removal tool. Grasp the tick as close to its head as possible. Avoid grasping the tick on its body. 3. Pull gently with steady upward pressure until  the tick lets go. Do not twist the tick or jerk it suddenly. This may break off the tick's head or mouth parts. 4. Do not squeeze or crush the tick's body. This could force disease-carrying fluids from the tick into your body.  5. After the tick is removed, wash the bite area and your hands with soap and water or other disinfectant such as alcohol. 6. Apply a small amount of antiseptic cream or ointment to the bite site.  7. Wash and disinfect any instruments that were used.  Do not try to remove a tick by applying a hot match, petroleum jelly, or fingernail polish to the tick. These methods do not work and may increase the chances of disease being spread from the tick bite.  WHEN SHOULD YOU SEEK MEDICAL CARE? Contact your health care provider if you are unable to remove a tick from your skin or if a part of the tick breaks off and is stuck in the skin.  After a tick bite, you need to be aware of signs and symptoms that could be related to diseases spread by ticks. Contact your health care provider if you develop any of the following in the days or weeks after the tick bite:  Unexplained fever.  Rash. A circular rash that appears days or weeks after the tick bite may indicate the possibility of Lyme disease. The rash may resemble   a target with a bull's-eye and may occur at a different part of your body than the tick bite.  Redness and swelling in the area of the tick bite.   Tender, swollen lymph glands.   Diarrhea.   Weight loss.   Cough.   Fatigue.   Muscle, joint, or bone pain.   Abdominal pain.   Headache.   Lethargy or a change in your level of consciousness.  Difficulty walking or moving your legs.   Numbness in the legs.   Paralysis.  Shortness of breath.   Confusion.   Repeated vomiting.  Document Released: 12/29/1999 Document Revised: 10/21/2012 Document Reviewed: 06/10/2012 ExitCare Patient Information 2015 [employer]. This information is  not intended to replace advice given to you by your health care provider. Make sure you discuss any questions you have with your health care provider.  

## 2013-09-10 NOTE — Progress Notes (Addendum)
   Subjective:    Patient ID: Melissa Rodriguez, female    DOB: March 03, 1965, 48 y.o.   MRN: [id number]  HPITick bite on back on left leg. Pulled off last night. Head of tick is still under skin.   Happened 2 days ago. No other particular problems.  Review of Systems Small tick bite area noted on the back and right leg    Objective:   Physical Exam  The area has little bit of redness swelling there is no other signs of rash infection or illness      Assessment & Plan:  Tick bite on the back of the right leg. It happened 2 days ago. Retained parts were removed without difficulty warnings discussed  The area was taken care of without difficulty

## 2014-05-24 ENCOUNTER — Other Ambulatory Visit (HOSPITAL_COMMUNITY): Payer: Self-pay

## 2014-05-24 DIAGNOSIS — D509 Iron deficiency anemia, unspecified: Secondary | ICD-10-CM

## 2014-05-24 DIAGNOSIS — D696 Thrombocytopenia, unspecified: Secondary | ICD-10-CM

## 2014-05-24 DIAGNOSIS — C50919 Malignant neoplasm of unspecified site of unspecified female breast: Secondary | ICD-10-CM

## 2014-05-27 ENCOUNTER — Other Ambulatory Visit (HOSPITAL_COMMUNITY): Payer: BC Managed Care – PPO

## 2014-05-30 ENCOUNTER — Ambulatory Visit (HOSPITAL_COMMUNITY): Payer: Self-pay | Admitting: Hematology & Oncology

## 2014-06-15 ENCOUNTER — Other Ambulatory Visit (HOSPITAL_COMMUNITY): Payer: Self-pay | Admitting: Oncology

## 2014-06-15 DIAGNOSIS — Z1231 Encounter for screening mammogram for malignant neoplasm of breast: Secondary | ICD-10-CM

## 2014-07-08 ENCOUNTER — Ambulatory Visit (HOSPITAL_COMMUNITY)
Admission: RE | Admit: 2014-07-08 | Discharge: 2014-07-08 | Disposition: A | Discharge status: Home / Self Care | Payer: 59 | Source: Ambulatory Visit | Attending: Oncology | Admitting: Oncology

## 2014-07-08 DIAGNOSIS — Z1231 Encounter for screening mammogram for malignant neoplasm of breast: Secondary | ICD-10-CM | POA: Diagnosis not present

## 2014-12-22 ENCOUNTER — Encounter: Payer: Self-pay | Admitting: Family Medicine

## 2014-12-22 ENCOUNTER — Ambulatory Visit (INDEPENDENT_AMBULATORY_CARE_PROVIDER_SITE_OTHER): Payer: 59 | Admitting: Family Medicine

## 2014-12-22 VITALS — BP 110/70 | Temp 98.3°F | Ht 63.0 in | Wt 97.2 lb

## 2014-12-22 DIAGNOSIS — J329 Chronic sinusitis, unspecified: Secondary | ICD-10-CM

## 2014-12-22 MED ORDER — AZITHROMYCIN 250 MG PO TABS: ORAL_TABLET | ORAL | Status: DC

## 2014-12-22 NOTE — Progress Notes (Signed)
   Subjective:    Patient ID: Melissa Rodriguez, female    DOB: [birth year], 49 y.o.   MRN: [id number]  Sinusitis This is a new problem. The current episode started 1 to 4 weeks ago. Associated symptoms include coughing and a sore throat. (Runny nose) Past treatments include nothing.   Patient states no other concerns this  Visit.  Sore throt started  Then cong and feeloing bad  Also persist headache  Got a bit better then worse  Green gunky stuf, poss drainage  Others in family had it,  Fullness and pain oincheeks    Review of Systems  HENT: Positive for sore throat.   Respiratory: Positive for cough.    no vomiting or diarrhea    Objective:   Physical Exam Alert mild malaise. No major distress. Vitals stable. HEENT moderate nasal congestion Or tenderness pharynx slight erythema neck supple. Lungs clear heart regular in rhythm.       Assessment & Plan:  Impression acute rhinosinusitis plan antibiotics prescribed. Symptom care discussed warning signs discussed WSL

## 2015-05-22 ENCOUNTER — Ambulatory Visit (INDEPENDENT_AMBULATORY_CARE_PROVIDER_SITE_OTHER): Payer: 59 | Admitting: Family Medicine

## 2015-05-22 ENCOUNTER — Encounter: Payer: Self-pay | Admitting: Family Medicine

## 2015-05-22 VITALS — BP 118/78 | Temp 99.7°F | Ht 63.0 in | Wt 96.4 lb

## 2015-05-22 DIAGNOSIS — H1013 Acute atopic conjunctivitis, bilateral: Secondary | ICD-10-CM | POA: Diagnosis not present

## 2015-05-22 DIAGNOSIS — B309 Viral conjunctivitis, unspecified: Secondary | ICD-10-CM

## 2015-05-22 DIAGNOSIS — J301 Allergic rhinitis due to pollen: Secondary | ICD-10-CM | POA: Diagnosis not present

## 2015-05-22 DIAGNOSIS — J019 Acute sinusitis, unspecified: Secondary | ICD-10-CM | POA: Diagnosis not present

## 2015-05-22 MED ORDER — AZITHROMYCIN 250 MG PO TABS: ORAL_TABLET | ORAL | Status: DC

## 2015-05-22 MED ORDER — SULFACETAMIDE SODIUM 10 % OP SOLN: 2.0000 [drp] | Freq: Four times a day (QID) | OPHTHALMIC | Status: DC

## 2015-05-22 NOTE — Progress Notes (Signed)
   Subjective:    Patient ID: Melissa Rodriguez, female    DOB: 1965-05-08, 50 y.o.   MRN: [id number]  Conjunctivitis  The current episode started yesterday. The problem occurs rarely. Associated symptoms include congestion, rhinorrhea, sore throat and cough. Pertinent negatives include no fever, no ear pain, no wheezing and no eye discharge. Both eyes are affected. Cough This is a new problem. The current episode started 1 to 4 weeks ago. The cough is non-productive. Associated symptoms include rhinorrhea and a sore throat. Pertinent negatives include no chest pain, ear pain, fever, shortness of breath or wheezing.    Patient states no other concerns this visit.  Review of Systems  Constitutional: Negative for fever and activity change.  HENT: Positive for congestion, rhinorrhea and sore throat. Negative for ear pain.   Eyes: Negative for discharge.  Respiratory: Positive for cough. Negative for shortness of breath and wheezing.   Cardiovascular: Negative for chest pain.       Objective:   Physical Exam  Constitutional: She appears well-developed.  HENT:  Head: Normocephalic.  Nose: Nose normal.  Mouth/Throat: Oropharynx is clear and moist. No oropharyngeal exudate.  Neck: Neck supple.  Cardiovascular: Normal rate and normal heart sounds.   No murmur heard. Pulmonary/Chest: Effort normal and breath sounds normal. She has no wheezes.  Lymphadenopathy:    She has no cervical adenopathy.  Skin: Skin is warm and dry.  Nursing note and vitals reviewed.         Assessment & Plan:  Viral illness Viral conjunctivitis Allergic rhinitis Secondary rhinosinusitis Zithromax recommended if not getting better over next several days Follow-up if ongoing troubles Antibiotic eyedrops recommended for 3-5 days

## 2015-09-04 ENCOUNTER — Other Ambulatory Visit: Payer: Self-pay | Admitting: Family Medicine

## 2015-09-04 DIAGNOSIS — Z1231 Encounter for screening mammogram for malignant neoplasm of breast: Secondary | ICD-10-CM

## 2015-09-11 ENCOUNTER — Ambulatory Visit (HOSPITAL_COMMUNITY)
Admission: RE | Admit: 2015-09-11 | Discharge: 2015-09-11 | Disposition: A | Discharge status: Home / Self Care | Payer: 59 | Source: Ambulatory Visit | Attending: Family Medicine | Admitting: Family Medicine

## 2015-09-11 DIAGNOSIS — Z1231 Encounter for screening mammogram for malignant neoplasm of breast: Secondary | ICD-10-CM | POA: Insufficient documentation

## 2015-09-27 ENCOUNTER — Ambulatory Visit (HOSPITAL_COMMUNITY)
Admission: RE | Admit: 2015-09-27 | Discharge: 2015-09-27 | Disposition: A | Discharge status: Home / Self Care | Payer: 59 | Source: Ambulatory Visit | Attending: Nurse Practitioner | Admitting: Nurse Practitioner

## 2015-09-27 ENCOUNTER — Ambulatory Visit (INDEPENDENT_AMBULATORY_CARE_PROVIDER_SITE_OTHER): Payer: 59 | Admitting: Nurse Practitioner

## 2015-09-27 ENCOUNTER — Encounter: Payer: Self-pay | Admitting: Nurse Practitioner

## 2015-09-27 VITALS — BP 96/56 | Ht 63.0 in | Wt 99.0 lb

## 2015-09-27 DIAGNOSIS — M25572 Pain in left ankle and joints of left foot: Secondary | ICD-10-CM | POA: Insufficient documentation

## 2015-09-27 DIAGNOSIS — S9002XA Contusion of left ankle, initial encounter: Secondary | ICD-10-CM | POA: Diagnosis not present

## 2015-09-28 ENCOUNTER — Encounter: Payer: Self-pay | Admitting: Nurse Practitioner

## 2015-09-28 NOTE — Progress Notes (Signed)
Subjective:  Presents for complaints of pain in the lateral left ankle area that began last night after hitting her foot on a piece of equipment. Area is tender. Some pain with walking.  Objective:   BP (!) 96/56   Ht 5\' 3"  (1.6 m)   Wt 99 lb (44.9 kg)   BMI 17.54 kg/m  NAD. Alert, oriented. Moderate edema noted on the left lateral ankle malleolus. Extremely tender to palpation. Gait slow but steady.  Assessment: Pain in joint, ankle and foot, left - Plan: DG Tibia/Fibula Left, DG Ankle Complete Left  Contusion of ankle, left, initial encounter - Plan: DG Tibia/Fibula Left  Plan: X-rays pending. Ice/elevation with Ace wrap. Defers crutches. Further follow-up based on x-ray report.

## 2016-03-29 ENCOUNTER — Encounter: Payer: Self-pay | Admitting: Nurse Practitioner

## 2016-03-29 ENCOUNTER — Encounter: Payer: Self-pay | Admitting: Family Medicine

## 2016-03-29 ENCOUNTER — Ambulatory Visit (INDEPENDENT_AMBULATORY_CARE_PROVIDER_SITE_OTHER): Payer: 59 | Admitting: Nurse Practitioner

## 2016-03-29 VITALS — BP 122/80 | Temp 98.2°F | Ht 63.0 in | Wt 100.2 lb

## 2016-03-29 DIAGNOSIS — J069 Acute upper respiratory infection, unspecified: Secondary | ICD-10-CM

## 2016-03-29 DIAGNOSIS — B9689 Other specified bacterial agents as the cause of diseases classified elsewhere: Secondary | ICD-10-CM

## 2016-03-29 MED ORDER — AZITHROMYCIN 250 MG PO TABS: ORAL_TABLET | ORAL | 0 refills | Status: DC

## 2016-03-29 NOTE — Progress Notes (Signed)
Subjective:  Presents today for cough and runny nose.  Symptoms new for patient.  Mild ear ache with fullness, crackling began 2 weeks ago.  Ear ache accompanied by sore throat initially, resolved on its own.  Cough with congestion began 1 week ago, no sputum. Positive for mild headache, sinus pressure, rhinorrhea X 1 day with green mucous, low energy, SOB with stairs, and "rattling" in chest at night  Denies fever, chills, wheezing, abdominal pain, nausea, vomiting, or diarrhea. Works at a school.  Has not taken any medications for relief of symptoms.  Can not tolerate any antihistamines per pt report.  Has had good efficacy and tolerance to azithromycin in the past.  Tolerating fluids well.    Gets routine physicals done in this office.    Objective:   BP 122/80   Temp 98.2 F (36.8 C) (Oral)   Ht 5\' 3"  (1.6 m)   Wt 100 lb 3.2 oz (45.5 kg)   BMI 17.75 kg/m  Alert and oriented.  NAD.  Ears: cerumen R>L., TM's intact, no injection, mild clear effusion bilaterally Pharynx: +Green PND, no injection or lesions, Neck: supple, no cervical adenopathy Lungs: rhonchi bilateral upper lobes, clear in bases, one faint expiratory wheeze left posterior upper lobe only. No tachypnea. Cardiac: RRR, no murmurs  Assessment:  Bacterial upper respiratory infection  Plan:   Meds ordered this encounter  Medications  . azithromycin (ZITHROMAX Z-PAK) 250 MG tablet    Sig: Take 2 tablets (500 mg) on  Day 1,  followed by 1 tablet (250 mg) once daily on Days 2 through 5.    Dispense:  6 each    Refill:  0    Order Specific Question:   Supervising Provider    Answer:   Mikey Kirschner [[id number]]   Discussed options for cleaning cerumen.  Possible combination of cerumen build-up and effusion causing ear symptoms.  Recommended 1:1 hydrogen peroxide/warm water solution.   Bacterial upper respiratory infection with concerns for developing further into bronchitis.  Pt. Tolerates Zpak well.  Start Zpak and notify office if  symptoms worsen or fail to improve.  Work note provided for 03/29/16.    Recommend to continue to come in for routine physicals.  Return to office as needed.

## 2016-03-29 NOTE — Patient Instructions (Addendum)
Bottle of hydrogen peroxide and warm water.  Make half and half solution and drop into ear to clean, then drain.    Try a OTC steroid nasal spray, do not use Afrin.  May use the steroid nasal spray through allergy season if needed.

## 2017-02-21 ENCOUNTER — Other Ambulatory Visit: Payer: Self-pay | Admitting: Family Medicine

## 2017-02-21 DIAGNOSIS — Z1231 Encounter for screening mammogram for malignant neoplasm of breast: Secondary | ICD-10-CM

## 2017-02-27 ENCOUNTER — Ambulatory Visit (HOSPITAL_COMMUNITY)
Admission: RE | Admit: 2017-02-27 | Discharge: 2017-02-27 | Disposition: A | Discharge status: Home / Self Care | Payer: 59 | Source: Ambulatory Visit | Attending: Family Medicine | Admitting: Family Medicine

## 2017-02-27 ENCOUNTER — Encounter (HOSPITAL_COMMUNITY): Payer: Self-pay

## 2017-02-27 DIAGNOSIS — Z1231 Encounter for screening mammogram for malignant neoplasm of breast: Secondary | ICD-10-CM | POA: Diagnosis not present

## 2017-02-27 HISTORY — DX: Personal history of irradiation: Z92.3

## 2017-03-03 ENCOUNTER — Other Ambulatory Visit: Payer: Self-pay | Admitting: Family Medicine

## 2017-03-03 DIAGNOSIS — R928 Other abnormal and inconclusive findings on diagnostic imaging of breast: Secondary | ICD-10-CM

## 2017-03-12 ENCOUNTER — Encounter: Payer: Self-pay | Admitting: Family Medicine

## 2017-03-12 ENCOUNTER — Ambulatory Visit: Payer: 59 | Admitting: Family Medicine

## 2017-03-12 VITALS — BP 124/72 | Temp 98.0°F | Ht 63.0 in | Wt 98.2 lb

## 2017-03-12 DIAGNOSIS — R0789 Other chest pain: Secondary | ICD-10-CM | POA: Diagnosis not present

## 2017-03-12 MED ORDER — MELOXICAM 7.5 MG PO TABS: 7.5000 mg | ORAL_TABLET | Freq: Every day | ORAL | 0 refills | Status: DC

## 2017-03-12 NOTE — Progress Notes (Signed)
   Subjective:    Patient ID: Melissa Rodriguez, female    DOB: October 02, 1965, 52 y.o.   MRN: [id number]  HPIpain in left breast since having mammogram February 14th.  Patient had mammogram on 14 February She had a history of breast cancer approximately 10 years ago Since the mammogram she is experience soreness and discomfort around the chest region on the left side with some tingling into the upper arm and upper neck region.  She denies any fever chills sweats denies wheezing difficulty breathing.  Denies any growth or mass  The mammogram did show abnormality on the right side that they are recommending doing further mammogram and ultrasound patient states that because of her small breast size it is very difficult to do a mammogram and she does not feel further imaging would be of help she is hoping that they will allow her to do just ultrasound.  She points out that her breast cancer was seen by ultrasound alone.  She is willing to do an MRI if unable to do what is stated above.   Review of Systems Denies headache fever chills sweats denies nausea vomiting diarrhea does relate chest wall pain on the left side where the mammogram was where previous surgery was and relates some pain and numbness radiating into the upper arm on the left side    Objective:   Physical Exam Respiratory rate normal heart regular no crackles in the lungs no murmurs.  Breast exam bilateral small breast size noted bilateral would be very difficult to do mammogram with this There is some subjective tenderness along the left sternal border but no masses are felt       Assessment & Plan:  Probable nerve impingement versus neuropraxia from previous mammogram I believe it is reasonable to use a low-dose anti-inflammatory over the course the next few weeks if ongoing troubles may need referral  Will discuss her case with radiology specialist regarding whether or not the truly does not feel she can tolerate doing another  mammogram because of the discomfort that occurs could do just an ultrasound or potentially do an MRI to look at this area.

## 2017-03-16 ENCOUNTER — Encounter: Payer: Self-pay | Admitting: Family Medicine

## 2017-03-18 ENCOUNTER — Encounter (HOSPITAL_COMMUNITY): Payer: Self-pay

## 2017-03-18 ENCOUNTER — Ambulatory Visit (HOSPITAL_COMMUNITY)
Admission: RE | Admit: 2017-03-18 | Discharge: 2017-03-18 | Disposition: A | Discharge status: Home / Self Care | Payer: 59 | Source: Ambulatory Visit | Attending: Family Medicine | Admitting: Family Medicine

## 2017-03-18 DIAGNOSIS — R928 Other abnormal and inconclusive findings on diagnostic imaging of breast: Secondary | ICD-10-CM

## 2017-04-15 ENCOUNTER — Telehealth: Payer: Self-pay | Admitting: Family Medicine

## 2017-04-15 NOTE — Telephone Encounter (Signed)
Pt called stating that she developed a deep congested cough on Saturday. No other symptoms. She wanted advice on what could help her until she got an appt tomorrow. Nurse advised Mucinex or Delysum. Has an appt tomorrow with Hoyle Sauer.

## 2017-04-15 NOTE — Telephone Encounter (Signed)
Left message to return call 

## 2017-04-15 NOTE — Telephone Encounter (Signed)
Pt returned call;verbalized understanding.  

## 2017-04-15 NOTE — Telephone Encounter (Signed)
That is a reasonable advice-if high fevers difficulty breathing or worse needs to be seen sooner here or ER urgent care

## 2017-04-16 ENCOUNTER — Encounter: Payer: Self-pay | Admitting: Family Medicine

## 2017-04-16 ENCOUNTER — Ambulatory Visit: Payer: 59 | Admitting: Nurse Practitioner

## 2017-04-16 ENCOUNTER — Encounter: Payer: Self-pay | Admitting: Nurse Practitioner

## 2017-04-16 VITALS — BP 100/64 | Temp 97.6°F | Ht 63.0 in | Wt 97.4 lb

## 2017-04-16 DIAGNOSIS — J069 Acute upper respiratory infection, unspecified: Secondary | ICD-10-CM

## 2017-04-16 DIAGNOSIS — B9689 Other specified bacterial agents as the cause of diseases classified elsewhere: Secondary | ICD-10-CM

## 2017-04-16 MED ORDER — AZITHROMYCIN 250 MG PO TABS: ORAL_TABLET | ORAL | 0 refills | Status: DC

## 2017-04-17 ENCOUNTER — Encounter: Payer: Self-pay | Admitting: Nurse Practitioner

## 2017-04-17 NOTE — Progress Notes (Signed)
Subjective:  Presents for c/o very congested cough. Began 4 days ago. No fever, sore throat, headache or ear pain. Slight runny nose. Frequent non productive cough. Feels congested in her upper chest anterior. No wheezing. CP with DB. No SOB. Thick mucus difficult to cough up.   Objective:   BP 100/64   Temp 97.6 F (36.4 C) (Oral)   Ht 5\' 3"  (1.6 m)   Wt 97 lb 6.4 oz (44.2 kg)   BMI 17.25 kg/m  NAD. Alert, oriented. TMs clear effusion. Pharynx injected with green PND noted. Neck supple with anterior adenopathy. Lungs clear. Heart RRR. Frequent congested cough. No tachypnea. Normal color.   Assessment:  Bacterial upper respiratory infection    Plan:   Meds ordered this encounter  Medications  . azithromycin (ZITHROMAX Z-PAK) 250 MG tablet    Sig: Take 2 tablets (500 mg) on  Day 1,  followed by 1 tablet (250 mg) once daily on Days 2 through 5.    Dispense:  6 each    Refill:  0    Order Specific Question:   Supervising Provider    Answer:   Mikey Kirschner [[id number]]   Given samples of Mucinex; take with adequate fluids. Continue OTC meds as directed for head congestion and cough.  Warning signs reviewed. Call back if worsens or persists.  Reminded about wellness exam.

## 2017-06-21 ENCOUNTER — Encounter: Payer: Self-pay | Admitting: Family Medicine

## 2018-02-17 ENCOUNTER — Ambulatory Visit: Payer: No Typology Code available for payment source | Admitting: Family Medicine

## 2018-02-17 VITALS — BP 128/74 | Temp 98.6°F | Ht 63.0 in | Wt 101.4 lb

## 2018-02-17 DIAGNOSIS — R300 Dysuria: Secondary | ICD-10-CM

## 2018-02-17 DIAGNOSIS — R102 Pelvic and perineal pain: Secondary | ICD-10-CM

## 2018-02-17 LAB — POCT URINALYSIS DIPSTICK
Spec Grav, UA: <=1.005 — AB (ref 1.010–1.025)
pH, UA: 8 (ref 5.0–8.0)

## 2018-02-17 NOTE — Progress Notes (Addendum)
Subjective:    Patient ID: Melissa Rodriguez, female    DOB: 1965/07/26, 53 y.o.   MRN: [id number]  Urinary Tract Infection   This is a new problem. Episode onset: 2 weeks. Associated symptoms include frequency. Pertinent negatives include no flank pain, hematuria, nausea or vomiting. Associated symptoms comments: Abdominal pain, painful urination. Treatments tried: cranberry supplement.   Reports lower abdominal pressure, frequency of urination x 2 weeks. Reports some cramps to left lower abdomen.  Reports woke up this morning with pain to vaginal area and lots of pressure. No fever. No vaginal discharge. Reports menopause 5 years ago, no vaginal bleeding.   Sexually active, husband only partner. States no concern for STDs.      Review of Systems  Constitutional: Negative for fatigue, fever and unexpected weight change.  Gastrointestinal: Positive for abdominal pain. Negative for blood in stool, diarrhea, nausea and vomiting.  Genitourinary: Positive for dysuria, frequency and pelvic pain. Negative for flank pain, hematuria, vaginal bleeding, vaginal discharge and vaginal pain.       Objective:   Physical Exam Vitals signs and nursing note reviewed. Exam conducted with a chaperone present.  Constitutional:      General: She is not in acute distress.    Appearance: She is well-developed.  HENT:     Head: Normocephalic and atraumatic.  Neck:     Musculoskeletal: Neck supple.  Cardiovascular:     Rate and Rhythm: Normal rate and regular rhythm.     Heart sounds: Normal heart sounds. No murmur.  Pulmonary:     Effort: Pulmonary effort is normal. No respiratory distress.     Breath sounds: Normal breath sounds.  Abdominal:     General: Bowel sounds are normal. There is no distension.     Palpations: Abdomen is soft. There is no mass.     Tenderness: There is no abdominal tenderness. There is no right CVA tenderness or left CVA tenderness.  Genitourinary:    General: Normal vulva.       Vagina: Normal.     Cervix: No cervical motion tenderness, discharge, friability, erythema or cervical bleeding.     Uterus: Normal.      Adnexa: Right adnexa normal.       Left: Tenderness present. No mass.    Skin:    General: Skin is warm and dry.  Neurological:     Mental Status: She is alert and oriented to person, place, and time.  Psychiatric:        Mood and Affect: Mood normal.    Results for orders placed or performed in visit on 02/17/18  POCT urinalysis dipstick  Result Value Ref Range   Color, UA     Clarity, UA     Glucose, UA     Bilirubin, UA     Ketones, UA     Spec Grav, UA <=1.005 (A) 1.010 - 1.025   Blood, UA     pH, UA 8.0 5.0 - 8.0   Protein, UA     Urobilinogen, UA     Nitrite, UA     Leukocytes, UA     Appearance     Odor     Microscopic exam of urine: no WBCs, No RBCs    Assessment & Plan:  Pelvic pain  Dysuria - Plan: POCT urinalysis dipstick  Urinalysis is clear, no sign of infection. Will send for culture. Given left adnexal tenderness on exam, recommend pelvic ultrasound for further evaluation. May take tylenol or motrin  for pain prn. Warning signs discussed. Will schedule a 2 week f/u but may change based on results of testing. If ultrasound is normal and still having pelvic pressure and frequency of urination will likely need referral to urology.   Dr. Sallee Lange was consulted on this case and is in agreement with the above treatment plan.

## 2018-02-19 LAB — URINE CULTURE

## 2018-02-19 LAB — SPECIMEN STATUS REPORT

## 2018-02-22 ENCOUNTER — Emergency Department (HOSPITAL_COMMUNITY): Payer: No Typology Code available for payment source

## 2018-02-22 ENCOUNTER — Other Ambulatory Visit: Payer: Self-pay

## 2018-02-22 ENCOUNTER — Emergency Department (HOSPITAL_COMMUNITY)
Admission: EM | Admit: 2018-02-22 | Discharge: 2018-02-22 | Disposition: A | Discharge status: Home / Self Care | Payer: No Typology Code available for payment source | Attending: Emergency Medicine | Admitting: Emergency Medicine

## 2018-02-22 ENCOUNTER — Encounter (HOSPITAL_COMMUNITY): Payer: Self-pay | Admitting: Emergency Medicine

## 2018-02-22 DIAGNOSIS — N200 Calculus of kidney: Secondary | ICD-10-CM

## 2018-02-22 DIAGNOSIS — N132 Hydronephrosis with renal and ureteral calculous obstruction: Secondary | ICD-10-CM | POA: Insufficient documentation

## 2018-02-22 DIAGNOSIS — R109 Unspecified abdominal pain: Secondary | ICD-10-CM

## 2018-02-22 DIAGNOSIS — Z79899 Other long term (current) drug therapy: Secondary | ICD-10-CM | POA: Diagnosis not present

## 2018-02-22 DIAGNOSIS — Z853 Personal history of malignant neoplasm of breast: Secondary | ICD-10-CM | POA: Diagnosis not present

## 2018-02-22 LAB — COMPREHENSIVE METABOLIC PANEL
ALT: 21 U/L (ref 0–44)
AST: 26 U/L (ref 15–41)
Albumin: 4.8 g/dL (ref 3.5–5.0)
Alkaline Phosphatase: 45 U/L (ref 38–126)
Anion gap: 10 (ref 5–15)
BUN: 17 mg/dL (ref 6–20)
CO2: 27 mmol/L (ref 22–32)
Calcium: 10.2 mg/dL (ref 8.9–10.3)
Chloride: 103 mmol/L (ref 98–111)
Creatinine, Ser: 0.68 mg/dL (ref 0.44–1.00)
GFR calc Af Amer: >60 mL/min (ref >60)
GFR calc non Af Amer: >60 mL/min (ref >60)
Glucose, Bld: 104 mg/dL — ABNORMAL HIGH (ref 70–99)
Potassium: 3.8 mmol/L (ref 3.5–5.1)
Sodium: 140 mmol/L (ref 135–145)
Total Bilirubin: 0.7 mg/dL (ref 0.3–1.2)
Total Protein: 7.4 g/dL (ref 6.5–8.1)

## 2018-02-22 LAB — CBC WITH DIFFERENTIAL/PLATELET
Abs Immature Granulocytes: 0.03 10*3/uL (ref 0.00–0.07)
Basophils Absolute: 0 10*3/uL (ref 0.0–0.1)
Basophils Relative: 0 %
Eosinophils Absolute: 0 10*3/uL (ref 0.0–0.5)
Eosinophils Relative: 0 %
HCT: 43.1 % (ref 36.0–46.0)
Hemoglobin: 13.6 g/dL (ref 12.0–15.0)
Immature Granulocytes: 0 %
Lymphocytes Relative: 11 %
Lymphs Abs: 1 10*3/uL (ref 0.7–4.0)
MCH: 28.8 pg (ref 26.0–34.0)
MCHC: 31.6 g/dL (ref 30.0–36.0)
MCV: 91.1 fL (ref 80.0–100.0)
Monocytes Absolute: 0.3 10*3/uL (ref 0.1–1.0)
Monocytes Relative: 4 %
Neutro Abs: 7.8 10*3/uL — ABNORMAL HIGH (ref 1.7–7.7)
Neutrophils Relative %: 85 %
Platelets: 174 10*3/uL (ref 150–400)
RBC: 4.73 MIL/uL (ref 3.87–5.11)
RDW: 13 % (ref 11.5–15.5)
WBC: 9.3 10*3/uL (ref 4.0–10.5)
nRBC: 0 % (ref 0.0–0.2)

## 2018-02-22 LAB — URINALYSIS, ROUTINE W REFLEX MICROSCOPIC
Bilirubin Urine: NEGATIVE
Glucose, UA: NEGATIVE mg/dL
Ketones, ur: 15 mg/dL — AB
Leukocytes, UA: NEGATIVE
Nitrite: NEGATIVE
Protein, ur: NEGATIVE mg/dL
Specific Gravity, Urine: >1.03 — ABNORMAL HIGH (ref 1.005–1.030)
pH: 6 (ref 5.0–8.0)

## 2018-02-22 LAB — URINALYSIS, MICROSCOPIC (REFLEX)

## 2018-02-22 MED ORDER — HYDROCODONE-ACETAMINOPHEN 5-325 MG PO TABS: 1.0000 | ORAL_TABLET | Freq: Four times a day (QID) | ORAL | 0 refills | Status: DC | PRN

## 2018-02-22 MED ORDER — KETOROLAC TROMETHAMINE 30 MG/ML IJ SOLN
9.9000 mg | Freq: Once | INTRAMUSCULAR | Status: AC
  Administered 2018-02-22: 9.9 mg via INTRAVENOUS

## 2018-02-22 MED ORDER — KETOROLAC TROMETHAMINE 30 MG/ML IJ SOLN
10.0000 mg | Freq: Once | INTRAMUSCULAR | Status: DC
  Filled 2018-02-22: qty 1

## 2018-02-22 MED ORDER — HYDROMORPHONE HCL 1 MG/ML IJ SOLN
0.5000 mg | Freq: Once | INTRAMUSCULAR | Status: DC
  Filled 2018-02-22: qty 1

## 2018-02-22 MED ORDER — ONDANSETRON 4 MG PO TBDP
4.0000 mg | ORAL_TABLET | Freq: Once | ORAL | Status: AC
  Administered 2018-02-22: 4 mg via ORAL
  Filled 2018-02-22: qty 1

## 2018-02-22 MED ORDER — HYDROMORPHONE HCL 1 MG/ML IJ SOLN
0.5000 mg | Freq: Once | INTRAMUSCULAR | Status: AC
  Administered 2018-02-22: 0.5 mg via INTRAVENOUS

## 2018-02-22 MED ORDER — ONDANSETRON HCL 4 MG PO TABS: 4.0000 mg | ORAL_TABLET | Freq: Three times a day (TID) | ORAL | 0 refills | Status: DC | PRN

## 2018-02-22 NOTE — Discharge Instructions (Signed)
Unless you have specifically been told not to, take 400 mg of ibuprofen every 6 hours as needed for pain. Use vicodin for breakthrough pain as needed. Zofran as needed for nausea. Stay well hydrated. If you are having persistent symptoms beyond 48-72 hours then I would follow-up with urology. Return to ER for pain/nausea not being controlled with medications, fever or anything else concerning to you.

## 2018-02-22 NOTE — ED Provider Notes (Signed)
[facility] EMERGENCY DEPARTMENT Provider Note   CSN: [id number] Arrival date & time: 02/22/18  4098     History   Chief Complaint Chief Complaint  Patient presents with  . Flank Pain    HPI Melissa Rodriguez is a 53 y.o. female.  HPI   53 year old female with left lower abdominal to left flank pain.  Onset over a week ago.  Associated with increased urinary frequency.  No dysuria.  She went to her PCP for the same on 02/17/2018.  She states that they told her she did not have a UTI.  Records reviewed.  Subsequent culture did not show any significant growth.  She reports continued symptoms and then since last night the pain has been worse and into her left flank and left back.  She has not noticed any appreciable exacerbating relieving factors.  No change in her bowel movements.  No fevers or chills.  No nausea vomiting.  No unusual vaginal bleeding or discharge.  She is scheduled for a pelvic ultrasound if symptoms do not improve, but she reports that she felt she need to be evaluated more urgently given the worsening of her symptoms.  Past Medical History:  Diagnosis Date  . Adenocarcinoma of breast ([place]) 05/20/2011   Stage I (T1b N0), grade 1, well-differentiated adenocarcinoma of the medial lower quadrant of the left breast. It was a tubular carcinoma, status post lumpectomy on 03/13/2006, ER positive 79%, PR positive 92%, HER2/neu negative, Ki-67 marker low at 18%. Her cancer was 7 mm in size, and she had 4 sentinel nodes all negative along with her lumpectomy. She was treated with radiation therapy postoper  . Breast cancer ([place])   . Iron deficiency   . Iron deficiency anemia 05/20/2011   Secondary to GU bleeding.  Good absorber of PO iron  . Personal history of radiation therapy     Patient Active Problem List   Diagnosis Date Noted  . Thrombocytopenia ([place]) 05/22/2013  . Adenocarcinoma of breast ([place]) 05/20/2011  . Iron deficiency anemia 05/20/2011    Past Surgical History:    Procedure Laterality Date  . BREAST LUMPECTOMY Left   . [place] LEFT BREAST  02/2006  . LYMPH NODE BIOPSY LEFT  04/2006     OB History    Gravida  3   Para  3   Term  3   Preterm      AB      Living  3     SAB      TAB      Ectopic      Multiple      Live Births               Home Medications    Prior to Admission medications   Medication Sig Start Date End Date Taking? Authorizing Provider  Ascorbic Acid (VITAMIN C PO) Take 1 tablet by mouth daily.     [provider]  Calcium Carbonate Antacid (TUMS PO) Take 1 tablet by mouth as needed. Reported on 05/22/2015    [provider]  Cholecalciferol (VITAMIN D PO) Take 1,000 Units by mouth daily.     [provider]  Loratadine-Pseudoephedrine (CLARITIN-D 24 HOUR PO) Take 1 tablet by mouth daily. Reported on 05/22/2015    [provider]  Multiple Vitamin (MULTIVITAMIN) tablet Take 1 tablet by mouth daily.      [provider]    Family History Family History  Problem Relation Age of Onset  .  Hypertension Mother   . Hypertension Father   . Heart disease Father 14       MI in his 43s  . Cancer Maternal Aunt   . Cancer Paternal Uncle     Social History Social History   Tobacco Use  . Smoking status: Never Smoker  . Smokeless tobacco: Never Used  Substance Use Topics  . Alcohol use: No  . Drug use: No     Allergies   Benadryl [diphenhydramine hcl]; Ciprofloxacin; and Penicillins   Review of Systems Review of Systems  All systems reviewed and negative, other than as noted in HPI.  Physical Exam Updated Vital Signs BP (!) 119/47 (BP Location: Right Arm)   Pulse 75   Temp 97.8 F (36.6 C) (Oral)   Resp 18   Ht _0  (1.6 m)   Wt 45.8 kg   LMP 07/05/2012   SpO2 99%   BMI 17.89 kg/m   Physical Exam Vitals signs and nursing note reviewed.  Constitutional:      General: She is not in acute distress.    Appearance: She is well-developed.   HENT:     Head: Normocephalic and atraumatic.  Eyes:     General:        Right eye: No discharge.        Left eye: No discharge.     Conjunctiva/sclera: Conjunctivae normal.  Neck:     Musculoskeletal: Neck supple.  Cardiovascular:     Rate and Rhythm: Normal rate and regular rhythm.     Heart sounds: Normal heart sounds. No murmur. No friction rub. No gallop.   Pulmonary:     Effort: Pulmonary effort is normal. No respiratory distress.     Breath sounds: Normal breath sounds.  Abdominal:     General: There is no distension.     Palpations: Abdomen is soft.     Tenderness: There is abdominal tenderness.     Comments: LLQ to L flank tenderness w/o rebound or guarding  Musculoskeletal:        General: No tenderness.  Skin:    General: Skin is warm and dry.  Neurological:     Mental Status: She is alert.  Psychiatric:        Behavior: Behavior normal.        Thought Content: Thought content normal.      ED Treatments / Results  Labs (all labs ordered are listed, but only abnormal results are displayed) Labs Reviewed  URINALYSIS, ROUTINE W REFLEX MICROSCOPIC - Abnormal; Notable for the following components:      Result Value   Specific Gravity, Urine >1.030 (*)    Hgb urine dipstick LARGE (*)    Ketones, ur 15 (*)    All other components within normal limits  URINALYSIS, MICROSCOPIC (REFLEX) - Abnormal; Notable for the following components:   Bacteria, UA FEW (*)    All other components within normal limits  COMPREHENSIVE METABOLIC PANEL - Abnormal; Notable for the following components:   Glucose, Bld 104 (*)    All other components within normal limits  CBC WITH DIFFERENTIAL/PLATELET - Abnormal; Notable for the following components:   Neutro Abs 7.8 (*)    All other components within normal limits    EKG None  Radiology No results found.  Procedures Procedures (including critical care time)  Medications Ordered in ED Medications - No data to  display   Initial Impression / Assessment and Plan / ED Course  I have reviewed the triage  vital signs and the nursing notes.  Pertinent labs & imaging results that were available during my care of the patient were reviewed by me and considered in my medical decision making (see chart for details).    I have reviewed the triage vital signs and the nursing notes. Prior records were reviewed for additional information.    Pertinent labs & imaging results that were available during my care of the patient were reviewed by me and considered in my medical decision making (see chart for details).  52yF with L flank pain. CT showing ureteral stone. Symptoms improved. Expectant management.   It has been determined that no acute conditions requiring further emergency intervention are present at this time. The patient has been advised of the diagnosis and plan. I reviewed any labs and imaging including any potential incidental findings. I have reviewed nursing notes and appropriate previous records. We have discussed signs and symptoms that warrant return to the ED and they are listed in the discharge instructions.      Final Clinical Impressions(s) / ED Diagnoses   Final diagnoses:  Acute left flank pain  Kidney stone    ED Discharge Orders    None       Virgel Manifold, MD 02/25/18 1238

## 2018-02-22 NOTE — ED Triage Notes (Addendum)
Patient c/o left flank pain with frequency in need to urinate but decreased flow. Patient states has had lower pelvic pain and frequency x2 weeks in which she was seen buy PCP and checked for UTI, which was negative. Patient states scheduled for [place] next Tuesday but flank pain started this morning and radiates into lower abd. Denies any nausea, vomiting, diarrhea, or fevers. Patient reports taking tylenol at 9:30am with some relief.

## 2018-02-23 ENCOUNTER — Encounter: Payer: Self-pay | Admitting: Family Medicine

## 2018-02-23 ENCOUNTER — Other Ambulatory Visit: Payer: Self-pay | Admitting: Family Medicine

## 2018-02-23 ENCOUNTER — Telehealth: Payer: Self-pay | Admitting: Family Medicine

## 2018-02-23 DIAGNOSIS — N200 Calculus of kidney: Secondary | ICD-10-CM

## 2018-02-23 NOTE — Telephone Encounter (Signed)
Patient had to go to ER yesterday because she was still in pain and they did a CT scan and found out she had a kidney stone prescribe hydrocodone 5/325 but cant take it had a reaction to medication, had to take tylenol.Can you call in something at [facility] for pain.Also she wants CT cancelled for tomorrow I gave her the number to cancel.

## 2018-02-23 NOTE — Telephone Encounter (Signed)
Has 4 mm stone hopefully this will pass on its own Urgent referral to [facility] See if possible if they can see her here in the [place] office Let the patient know that if she does not feel like she is passed the stone within the next 24 to 48 hours I would recommend adding Flomax 0.4 mg 1 daily this is a medication that can help dilate the ureter which gives the stone a better chance of passing For now Tylenol for pain Drink plenty of fluids

## 2018-02-23 NOTE — Telephone Encounter (Signed)
1.  Cancel pelvic ultrasound 2.  In regards to pain there are limited choices Hydrocodone Oxycodone Dilaudid Are all opioids typically hydrocodone is started with first Oxycodone is considered stronger  Tramadol is the weaker pain medicine It would be helpful to know what type of reaction the patient had regarding the pain medicine so that we can help her choose what would be an alternative Also what is her pain level? Did the ER recommend anyFollow-up with urology?

## 2018-02-23 NOTE — Telephone Encounter (Signed)
Contacted patient and patient states that started to have trouble breathing after taking hydrocodone. Pt states she is taking Extra Strength Tylenol 2 in the morning, 2 in the afternoon and 2 before bed. 500 mg a piece. Pt states the Tylenol does help take the edge off. Pt state she really would rather stick with Tylenol because she is scared to try any pain medication. Pt states that ER did tell her to follow up with urology in a day or two if stone has not passed. Pt states she does not have a urologist. Pt would like one locally. Pt states she is going to the bathroom much better and drinking lots of water. Pt states her pain level is a 3 at this time. Please advise. Thank you

## 2018-02-23 NOTE — Telephone Encounter (Signed)
Contacted patient and informed her that she has a 4 mm stone that will hopefully pass on its own. Urgent referral to [facility] prefer [place] office placed. Informed patient that if she does not feel she has passed the stone in the next 24-48 minds, provider would recommend Flomax to help. Use tylenol for pain and drink plenty of fluids. Pt verbalized understanding. Pt states that she will call us back if she has not passed the stone within the next 24- 48 hours.

## 2018-02-23 NOTE — Telephone Encounter (Signed)
Please advise. Thank you

## 2018-02-24 ENCOUNTER — Ambulatory Visit (HOSPITAL_COMMUNITY): Payer: No Typology Code available for payment source

## 2018-02-26 ENCOUNTER — Encounter (HOSPITAL_COMMUNITY)
Admission: AD | Disposition: A | Discharge status: Home / Self Care | Payer: Self-pay | Source: Home / Self Care | Attending: Urology

## 2018-02-26 ENCOUNTER — Ambulatory Visit (HOSPITAL_COMMUNITY)
Admission: AD | Admit: 2018-02-26 | Discharge: 2018-02-26 | Disposition: A | Discharge status: Home / Self Care | Payer: No Typology Code available for payment source | Attending: Urology | Admitting: Urology

## 2018-02-26 ENCOUNTER — Encounter (HOSPITAL_COMMUNITY): Payer: Self-pay | Admitting: General Practice

## 2018-02-26 ENCOUNTER — Telehealth: Payer: Self-pay | Admitting: Family Medicine

## 2018-02-26 ENCOUNTER — Other Ambulatory Visit: Payer: Self-pay | Admitting: Urology

## 2018-02-26 ENCOUNTER — Ambulatory Visit (HOSPITAL_COMMUNITY): Payer: No Typology Code available for payment source

## 2018-02-26 DIAGNOSIS — N201 Calculus of ureter: Secondary | ICD-10-CM | POA: Diagnosis not present

## 2018-02-26 DIAGNOSIS — Z853 Personal history of malignant neoplasm of breast: Secondary | ICD-10-CM | POA: Insufficient documentation

## 2018-02-26 HISTORY — DX: Personal history of urinary calculi: Z87.442

## 2018-02-26 HISTORY — PX: EXTRACORPOREAL SHOCK WAVE LITHOTRIPSY: SHX1557

## 2018-02-26 SURGERY — LITHOTRIPSY, ESWL
Anesthesia: LOCAL | Laterality: Left

## 2018-02-26 MED ORDER — TAMSULOSIN HCL 0.4 MG PO CAPS: 0.4000 mg | ORAL_CAPSULE | Freq: Every day | ORAL | 0 refills | Status: DC

## 2018-02-26 MED ORDER — OXYCODONE-ACETAMINOPHEN 5-325 MG PO TABS: 1.0000 | ORAL_TABLET | ORAL | 0 refills | Status: DC | PRN

## 2018-02-26 MED ORDER — DIAZEPAM 5 MG PO TABS
10.0000 mg | ORAL_TABLET | ORAL | Status: AC
  Administered 2018-02-26: 10 mg via ORAL
  Filled 2018-02-26: qty 2

## 2018-02-26 MED ORDER — SODIUM CHLORIDE 0.9 % IV SOLN
INTRAVENOUS | Status: DC
  Administered 2018-02-26: 16:00:00 via INTRAVENOUS

## 2018-02-26 MED ORDER — DIPHENHYDRAMINE HCL 25 MG PO CAPS: 25.0000 mg | ORAL_CAPSULE | ORAL | Status: DC

## 2018-02-26 NOTE — Telephone Encounter (Signed)
Flomax 0.4 mg, #30, 1 daily this can help relax the ureter to improve the chances of the stone passing  Please also talk with the patient regarding the level of pain she is having In other words how severe? Please see if urology can urgently see her in the near future because of her problems

## 2018-02-26 NOTE — Telephone Encounter (Signed)
Certainly glad they are able to see her today we will see what they need to do from there thank you

## 2018-02-26 NOTE — Telephone Encounter (Signed)
Pt felt better & thought she had passed her kidney stone  Now pain has returned, waiting to hear from Urology  Pt states Dr. Nicki Reaper mentioned that he might order Flomax to help pt  Please advise    [place]

## 2018-02-26 NOTE — Telephone Encounter (Signed)
Discussed with pt. Pt states pain is severe and she got just a call this morning and she is seeing urology today at 1pm. She said to hold off on calling in flomax to see what urologist gives her.

## 2018-02-26 NOTE — Telephone Encounter (Signed)
Please advise. Thank you

## 2018-02-26 NOTE — Discharge Instructions (Signed)

## 2018-02-27 ENCOUNTER — Encounter (HOSPITAL_COMMUNITY): Payer: Self-pay | Admitting: Urology

## 2018-03-03 ENCOUNTER — Ambulatory Visit: Payer: No Typology Code available for payment source | Admitting: Family Medicine

## 2018-06-22 ENCOUNTER — Telehealth: Payer: No Typology Code available for payment source | Admitting: Physician Assistant

## 2018-06-22 ENCOUNTER — Encounter: Payer: Self-pay | Admitting: Physician Assistant

## 2018-06-22 DIAGNOSIS — R599 Enlarged lymph nodes, unspecified: Secondary | ICD-10-CM

## 2018-06-22 DIAGNOSIS — W57XXXA Bitten or stung by nonvenomous insect and other nonvenomous arthropods, initial encounter: Secondary | ICD-10-CM

## 2018-06-22 DIAGNOSIS — M7918 Myalgia, other site: Secondary | ICD-10-CM

## 2018-06-22 NOTE — Progress Notes (Signed)
Based on what you shared with me, I feel your condition warrants further evaluation and I recommend that you be seen for a face to face office visit.  Melissa Rodriguez,  You symptoms of leg muscle aches with the history of tick bite, warrant face to face evaluation for possible labs and to assess the needed for longer duration of antibiotics if needed.      NOTE: If you entered your credit card information for this eVisit, you will not be charged. You may see a "hold" on your card for the $35 but that hold will drop off and you will not have a charge processed.  If you are having a true medical emergency please call 911.  If you need an urgent face to face visit, [facility] has four urgent care centers for your convenience.      [email or web address] to reserve your spot online an avoid wait times  [facility] [address], [address] [place], [place] [address] Modified hours of operation: Monday-Friday, 12 PM to 6 PM  Saturday & Sunday 10 AM to 4 PM *Across the street from [place] (New Address!) [address] [place], [place] [address] *Just off [address], across the road from [place] hours of operation: Monday-Friday, 12 PM to 6 PM  Closed Saturday & Sunday   The following sites will take your insurance:  . [facility] [place] a Provider at this Location  [address] [place], [place] [address] . 10 am to 8 pm Monday-Friday . 12 pm to 8 pm Saturday-Sunday   . [facility] at [place] a Provider at this Location  [address], [place], [place] [address] . 8 am to 8 pm Monday-Friday . 9 am to 6 pm Saturday . 11 am to 6 pm Sunday   . [facility] at [place] Get Driving Directions  [address] [place],  [address] . 8 am to 8 pm Monday-Friday . 8 am to 4 pm Saturday-Sunday   Your e-visit answers were reviewed by a board certified advanced clinical practitioner to complete your personal care plan.  Thank you for using e-Visits. I spent 5-10 minutes on review and completion of this note- Lacy Duverney [facility]

## 2018-06-23 ENCOUNTER — Encounter: Payer: Self-pay | Admitting: Family Medicine

## 2018-06-23 ENCOUNTER — Ambulatory Visit (INDEPENDENT_AMBULATORY_CARE_PROVIDER_SITE_OTHER): Payer: No Typology Code available for payment source | Admitting: Family Medicine

## 2018-06-23 ENCOUNTER — Other Ambulatory Visit: Payer: Self-pay

## 2018-06-23 VITALS — BP 110/68 | Temp 98.3°F | Wt 97.0 lb

## 2018-06-23 DIAGNOSIS — R21 Rash and other nonspecific skin eruption: Secondary | ICD-10-CM | POA: Diagnosis not present

## 2018-06-23 NOTE — Progress Notes (Signed)
   Subjective:    Patient ID: Melissa Rodriguez, female    DOB: 1965/09/04, 53 y.o.   MRN: [id number]  HPITick bite groin area. Pulled off last Wednesday. Lymph nodes swollen and sore to touch under tick bit. Legs were aching but not aching now.   Found quickly   Bite itched and sens the next day  Then the glands got a bit sore ane puffy      Also noted less soreness    No fever  No rash    n o  Ha     Patient contacted the folks with the visit.  They expressed concerns.  They recommended she come in for evaluation and potential blood work  Review of Systems No headache, no major weight loss or weight gain, no chest pain no back pain abdominal pain no change in bowel habits complete ROS otherwise negative     Objective:   Physical Exam   Alert vitals stable, NAD. Blood pressure good on repeat. HEENT normal. Lungs clear. Heart regular rate and rhythm. Slight irritation right lower abdomen at site of bite mild slightly tender reactive inguinal lymph node on right side     Assessment & Plan:  Impression bite with secondary reactive lymph node.  Highly doubt acute local infection.  Highly doubt systemic tickborne illness.  Discussed.  Warning signs discussed carefully

## 2020-11-04 IMAGING — CT CT RENAL STONE PROTOCOL
2 of 4 series · 16 of 46 positions shown, 18 images · non-contrast
Comparison: No priors.

CLINICAL DATA: 52-year-old female with history of left-sided flank
pain. Frequent urination. History of left-sided breast cancer status
post lumpectomy.

EXAM:
CT ABDOMEN AND PELVIS WITHOUT CONTRAST
TECHNIQUE: Multidetector CT imaging of the abdomen and pelvis was performed
following the standard protocol without IV contrast.

[Series 2: axial st · axial · 0.51mm/px · z∈[+916,+1296]mm · 13 of 84 slices shown, 15 images]
[im 4/84  soft-tissue]
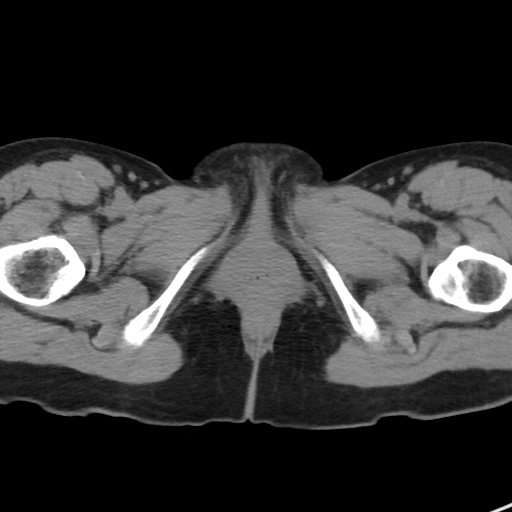
[im 4/84  bone]
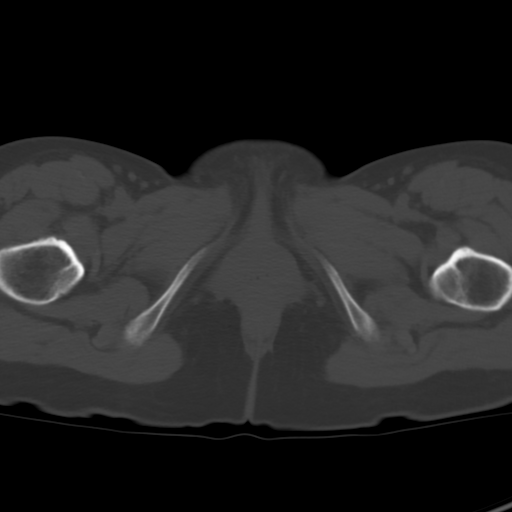
[im 10/84  soft-tissue]
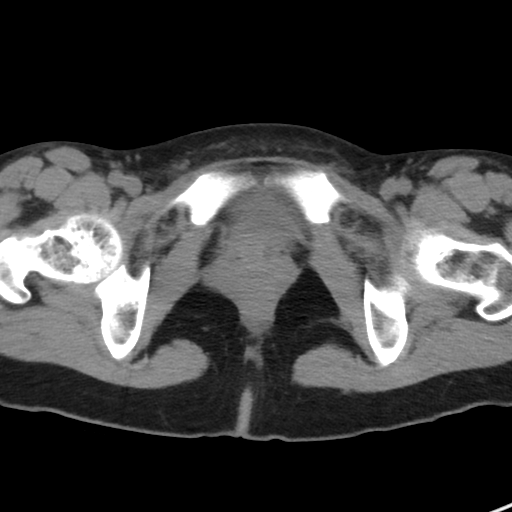
[im 17/84  soft-tissue]
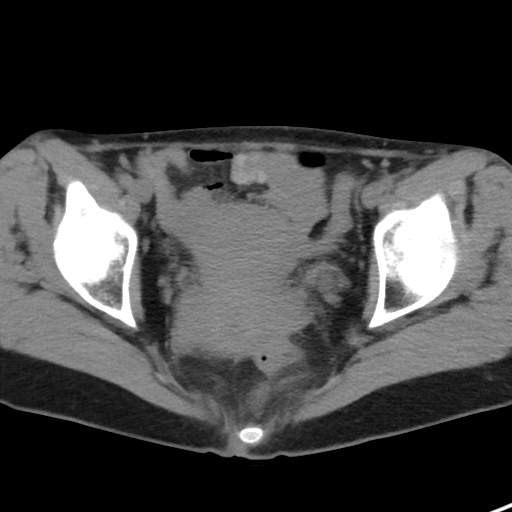
[im 24/84  soft-tissue]
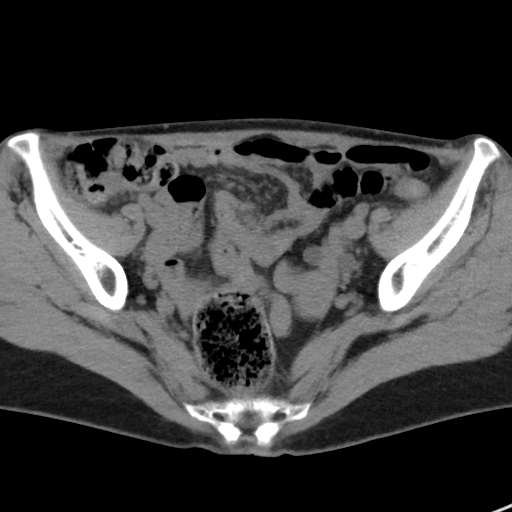
[im 30/84  soft-tissue]
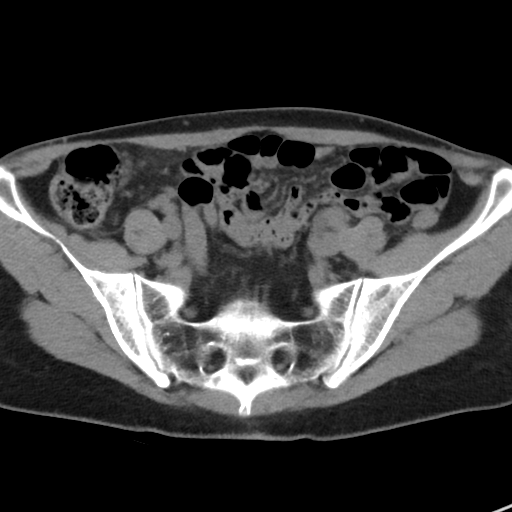
[im 37/84  soft-tissue]
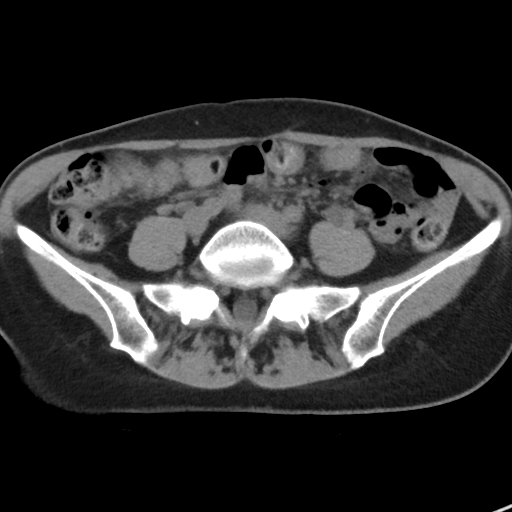
[im 44/84  soft-tissue]
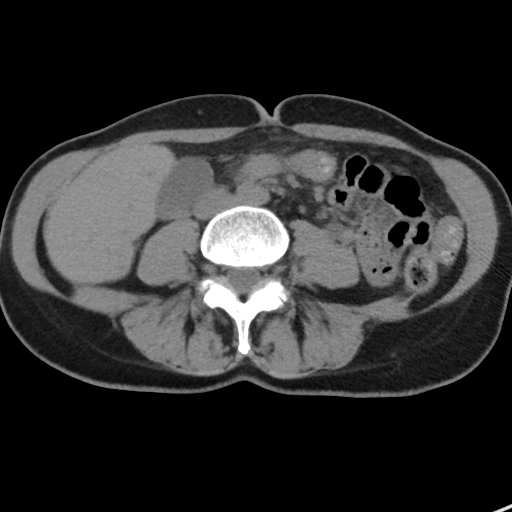
[im 47/84  soft-tissue]
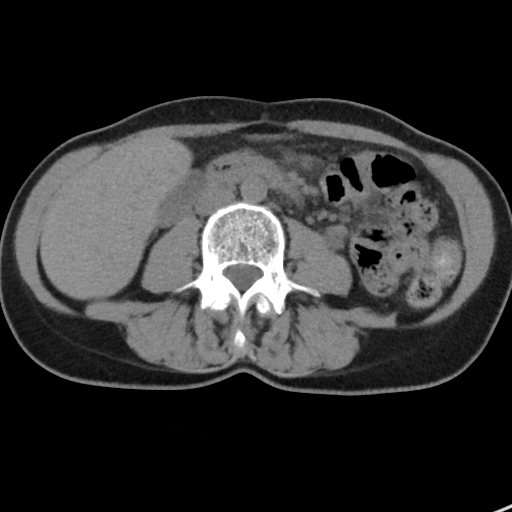
[im 54/84  soft-tissue]
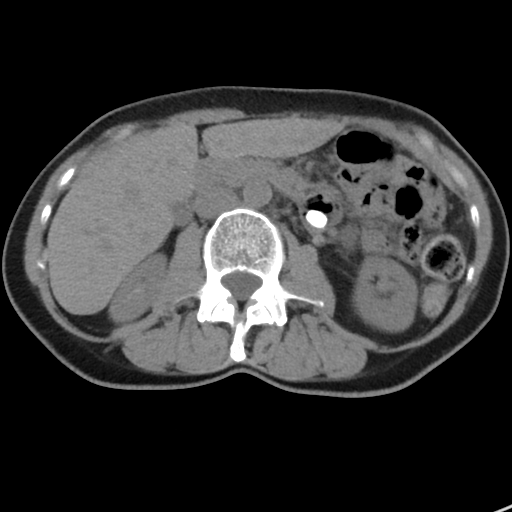
[im 54/84  bone]
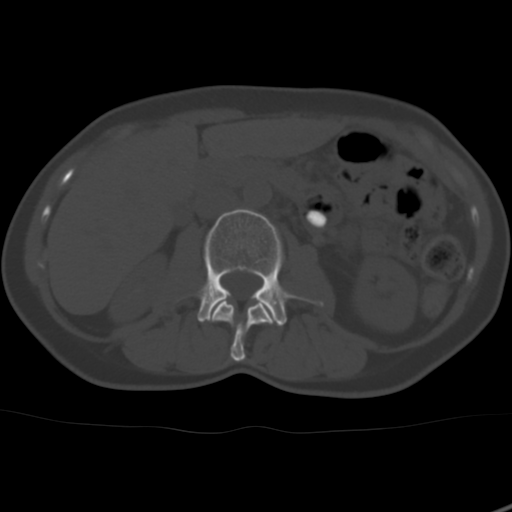
[im 60/84  soft-tissue]
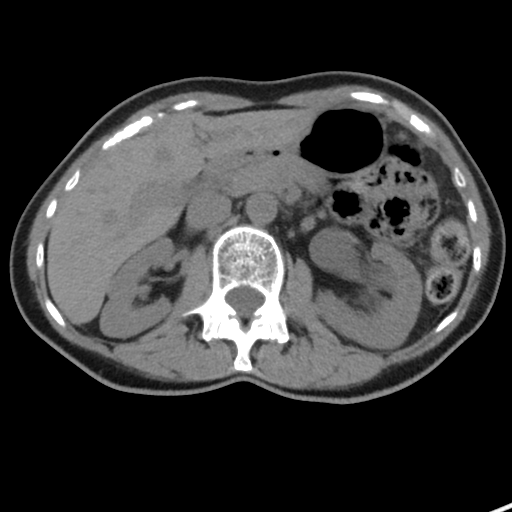
[im 67/84  soft-tissue]
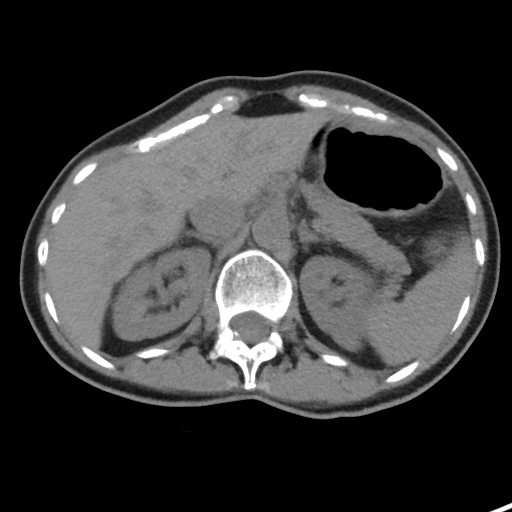
[im 74/84  soft-tissue]
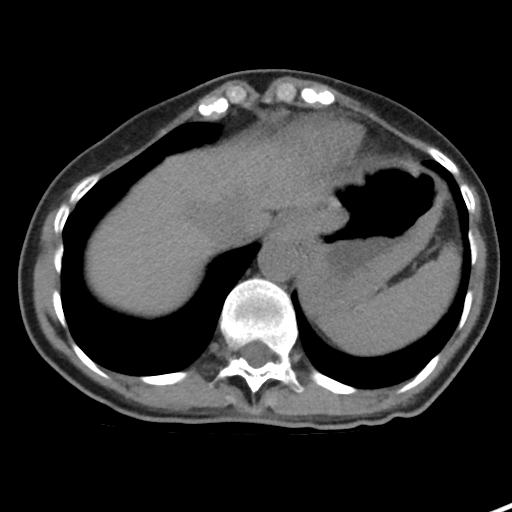
[im 80/84  soft-tissue]
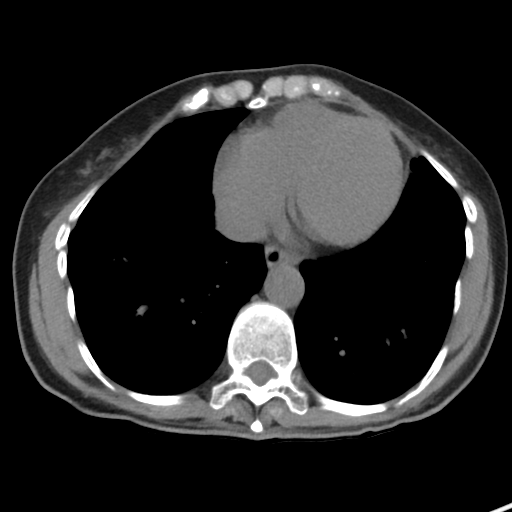

[Series 5: coronal st · coronal · 0.65mm/px · 3 of 72 slices shown]
[im 24/72  soft-tissue]
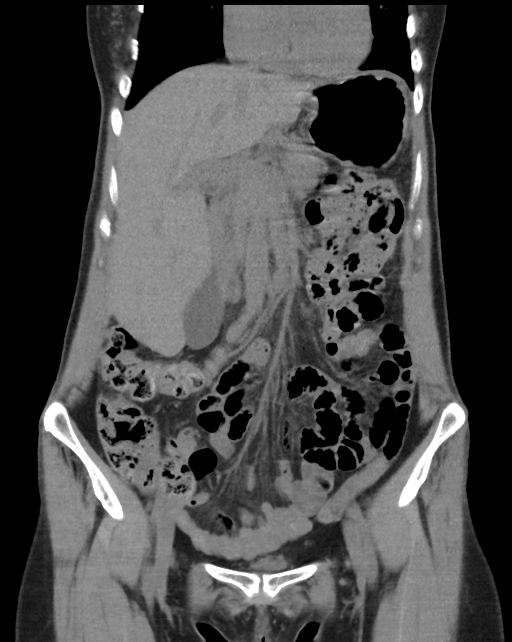
[im 32/72  soft-tissue]
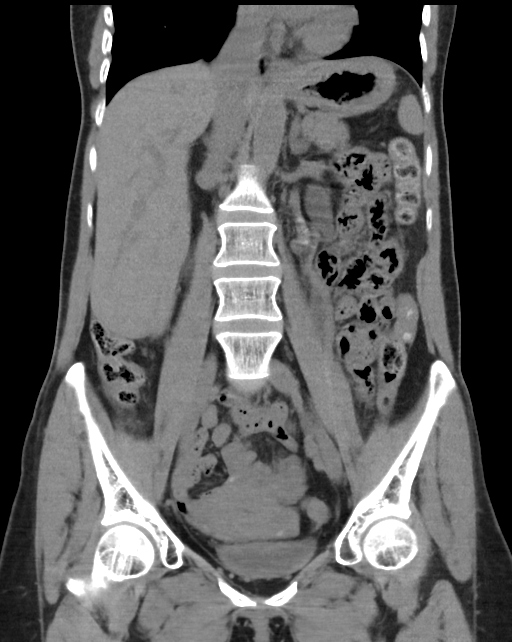
[im 40/72  soft-tissue]
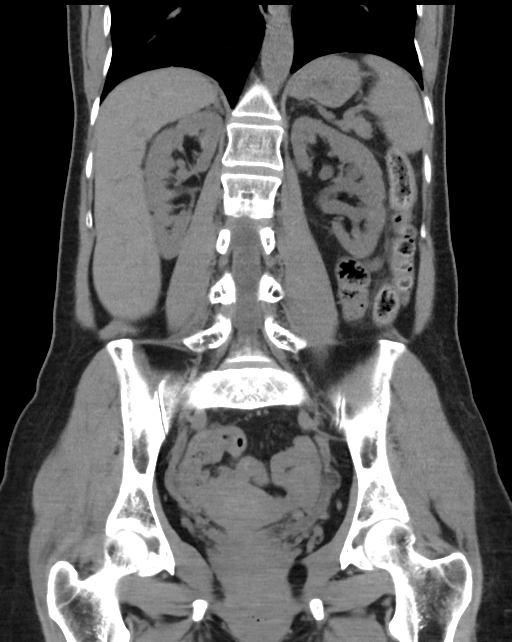

[16 of 46 positions shown; findings below may reference images not displayed]

FINDINGS: Lower chest: Mild scarring in the inferior segment of the lingula.

Hepatobiliary: No definite suspicious cystic or solid hepatic
lesions are confidently identified on today's noncontrast CT
examination. Unenhanced appearance of the gallbladder is normal.

Pancreas: No definite pancreatic mass or peripancreatic fluid or
inflammatory changes are noted on today's noncontrast CT
examination.

Spleen: Unremarkable.

Adrenals/Urinary Tract: In the distal third of the left ureter
shortly before the left ureterovesicular junction there is a 4 mm
calculus. This is associated with mild to moderate proximal left
hydroureteronephrosis. No additional calculi are noted within the
collecting system of either kidney, along the course of the right
ureter or within the lumen of the urinary bladder. No right
hydroureteronephrosis. Unenhanced appearance of the kidneys is
otherwise normal. Bilateral adrenal glands are normal in appearance.
Urinary bladder is nearly decompressed, but otherwise unremarkable
in appearance.

Stomach/Bowel: Normal appearance of the stomach. No pathologic
dilatation of small bowel or colon. Normal appendix.

Vascular/Lymphatic: No atherosclerotic calcifications noted in the
abdominal aorta or pelvic vasculature. No lymphadenopathy noted in
the abdomen or pelvis.

Reproductive: Unenhanced appearance of the uterus and ovaries is
unremarkable.

Other: No significant volume of ascites.  No pneumoperitoneum.

Musculoskeletal: There are no aggressive appearing lytic or blastic
lesions noted in the visualized portions of the skeleton.
IMPRESSION: 1. 4 mm calculus in the distal third of the left ureter shortly
before the left ureterovesicular junction with mild to moderate
proximal left hydroureteronephrosis.

## 2020-11-08 IMAGING — DX DG ABDOMEN 1V
1 series · 1 of 1 positions shown · non-contrast
Comparison: 02/26/2018, body CT 02/22/2018

CLINICAL DATA: Left ureteral stone.  Pre lithotripsy.

EXAM:
ABDOMEN - 1 VIEW

[abdomen kub]
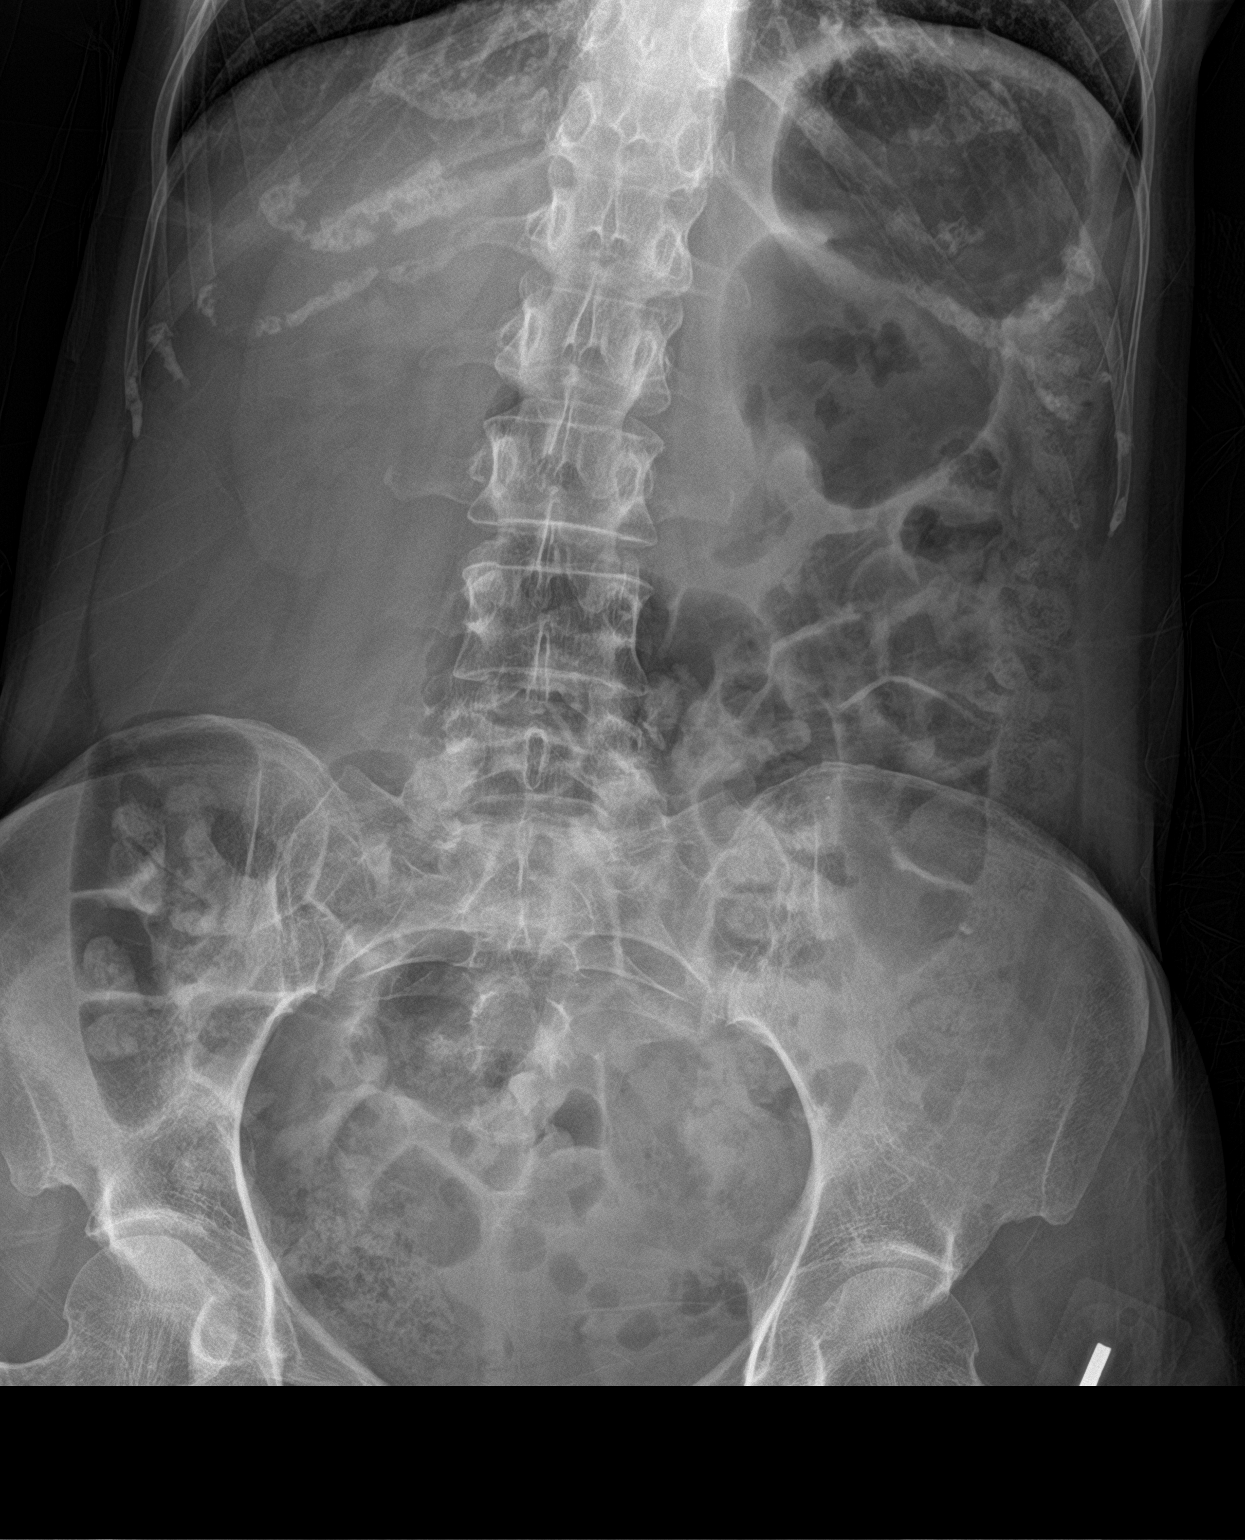

[1 of 1 positions shown; findings below may reference images not displayed]

FINDINGS: The bowel gas pattern is normal. No radio-opaque calculi or other
significant radiographic abnormality are seen. The distal left
ureteral stone demonstrated by CT is not seen radiographically.
IMPRESSION: Negative.

## 2022-03-19 ENCOUNTER — Other Ambulatory Visit: Payer: Self-pay | Admitting: Family Medicine

## 2022-03-19 DIAGNOSIS — Z1231 Encounter for screening mammogram for malignant neoplasm of breast: Secondary | ICD-10-CM

## 2022-07-11 ENCOUNTER — Encounter: Payer: Self-pay | Admitting: Nurse Practitioner

## 2022-07-11 ENCOUNTER — Ambulatory Visit (INDEPENDENT_AMBULATORY_CARE_PROVIDER_SITE_OTHER): Payer: Commercial Managed Care - PPO | Admitting: Nurse Practitioner

## 2022-07-11 ENCOUNTER — Other Ambulatory Visit (HOSPITAL_COMMUNITY): Payer: Self-pay | Admitting: Nurse Practitioner

## 2022-07-11 VITALS — BP 119/71 | HR 97 | Temp 98.4°F | Ht 63.0 in | Wt 99.4 lb

## 2022-07-11 DIAGNOSIS — N912 Amenorrhea, unspecified: Secondary | ICD-10-CM | POA: Diagnosis not present

## 2022-07-11 DIAGNOSIS — Z853 Personal history of malignant neoplasm of breast: Secondary | ICD-10-CM

## 2022-07-11 DIAGNOSIS — N6320 Unspecified lump in the left breast, unspecified quadrant: Secondary | ICD-10-CM | POA: Diagnosis not present

## 2022-07-11 NOTE — Progress Notes (Signed)
   Subjective:    Patient ID: Melissa Rodriguez, female    DOB: 1965/11/29, 57 y.o.   MRN: [id number]  HPI Pt come in today with a lump on left breast. Not painful. Pt has previously had breast cancer. Lump has be present for approx 3 days. Has intermittent tenderness which she thinks may be possible cycling with her ovaries. Area seems to be smaller over the past couple of days.  Minimal tenderness to palpation.  No fever.  No nipple drainage.  No erythema or warmth. Denies any vaginal bleeding or pelvic pain. Previous history of breast cancer in her left breast see previous notes.  Review of Systems  Constitutional:  Negative for fever.  Respiratory:  Negative for cough, chest tightness and shortness of breath.   Cardiovascular:  Negative for chest pain.  Genitourinary:  Negative for pelvic pain and vaginal bleeding.       Objective:   Physical Exam NAD.  Alert, oriented.  Lungs clear.  Heart regular rate rhythm.  Right breast slightly dense tissue, no obvious masses.  No nipple discharge.  Axilla no adenopathy.  Left breast a firm oblong area approximately 2 cm palpated on the outer edge of the left breast near the mid axillary line at 2-3 o'clock.  No lymphadenopathy palpated in the axillary area. Today's Vitals   07/11/22 1526  BP: 119/71  Pulse: 97  Temp: 98.4 F (36.9 C)  SpO2: 100%  Weight: 99 lb 6.4 oz (45.1 kg)  Height: 5\' 3"  (1.6 m)   Body mass index is 17.61 kg/m.        Assessment & Plan:  Mass of left breast, unspecified quadrant - Plan: MM 3D DIAGNOSTIC MAMMOGRAM BILATERAL BREAST, [place] LIMITED ULTRASOUND INCLUDING AXILLA LEFT BREAST , CANCELED: MM Digital Diagnostic Bilat, CANCELED: US BREAST COMPLETE UNI LEFT INC AXILLA  Amenorrhea - Plan: Follicle stimulating hormone Diagnostic mammogram and ultrasound pending. FSH pending. Further follow-up based on test results. Recommend preventive health physical.

## 2022-07-16 ENCOUNTER — Other Ambulatory Visit (HOSPITAL_COMMUNITY): Payer: Self-pay | Admitting: Nurse Practitioner

## 2022-07-16 ENCOUNTER — Ambulatory Visit (HOSPITAL_COMMUNITY)
Admission: RE | Admit: 2022-07-16 | Discharge: 2022-07-16 | Disposition: A | Discharge status: Home / Self Care | Payer: Commercial Managed Care - PPO | Source: Ambulatory Visit | Attending: Nurse Practitioner | Admitting: Nurse Practitioner

## 2022-07-16 ENCOUNTER — Other Ambulatory Visit (HOSPITAL_COMMUNITY): Payer: Self-pay | Admitting: Family Medicine

## 2022-07-16 ENCOUNTER — Telehealth: Payer: Self-pay | Admitting: *Deleted

## 2022-07-16 ENCOUNTER — Encounter (HOSPITAL_COMMUNITY): Payer: Self-pay

## 2022-07-16 DIAGNOSIS — Z853 Personal history of malignant neoplasm of breast: Secondary | ICD-10-CM | POA: Insufficient documentation

## 2022-07-16 DIAGNOSIS — N6320 Unspecified lump in the left breast, unspecified quadrant: Secondary | ICD-10-CM | POA: Diagnosis present

## 2022-07-16 HISTORY — PX: BREAST BIOPSY: SHX20

## 2022-07-16 MED ORDER — LIDOCAINE HCL (PF) 2 % IJ SOLN
INTRAMUSCULAR | Status: AC
  Filled 2022-07-16: qty 10

## 2022-07-16 MED ORDER — LIDOCAINE-EPINEPHRINE (PF) 1 %-1:200000 IJ SOLN
INTRAMUSCULAR | Status: AC
  Filled 2022-07-16: qty 30

## 2022-07-16 NOTE — Telephone Encounter (Signed)
Radiology called and is proceeding forward with Biopsy of mass in breast today

## 2022-07-17 ENCOUNTER — Other Ambulatory Visit: Payer: Self-pay | Admitting: Family Medicine

## 2022-07-17 ENCOUNTER — Other Ambulatory Visit: Payer: Self-pay

## 2022-07-17 ENCOUNTER — Other Ambulatory Visit (HOSPITAL_COMMUNITY): Payer: Self-pay | Admitting: Nurse Practitioner

## 2022-07-17 DIAGNOSIS — R928 Other abnormal and inconclusive findings on diagnostic imaging of breast: Secondary | ICD-10-CM

## 2022-07-17 DIAGNOSIS — N6320 Unspecified lump in the left breast, unspecified quadrant: Secondary | ICD-10-CM

## 2022-07-17 DIAGNOSIS — R921 Mammographic calcification found on diagnostic imaging of breast: Secondary | ICD-10-CM

## 2022-07-17 NOTE — Telephone Encounter (Signed)
Referral placed to hem/ onc per drs notes

## 2022-07-17 NOTE — Telephone Encounter (Signed)
Nurses-please put in referral for Dr. Wyline Beady hematology oncology [facility] due to breast cancer Patient is aware thank you

## 2022-07-19 ENCOUNTER — Encounter: Payer: Self-pay | Admitting: Family Medicine

## 2022-07-23 LAB — SURGICAL PATHOLOGY

## 2022-07-26 ENCOUNTER — Ambulatory Visit
Admission: RE | Admit: 2022-07-26 | Discharge: 2022-07-26 | Disposition: A | Discharge status: Home / Self Care | Payer: Commercial Managed Care - PPO | Source: Ambulatory Visit | Attending: Family Medicine | Admitting: Family Medicine

## 2022-07-26 ENCOUNTER — Ambulatory Visit
Admission: RE | Admit: 2022-07-26 | Discharge: 2022-07-26 | Disposition: A | Discharge status: Home / Self Care | Payer: Commercial Managed Care - PPO | Source: Ambulatory Visit | Attending: Nurse Practitioner | Admitting: Nurse Practitioner

## 2022-07-26 DIAGNOSIS — R921 Mammographic calcification found on diagnostic imaging of breast: Secondary | ICD-10-CM

## 2022-07-26 DIAGNOSIS — R928 Other abnormal and inconclusive findings on diagnostic imaging of breast: Secondary | ICD-10-CM

## 2022-07-26 HISTORY — PX: BREAST BIOPSY: SHX20

## 2022-07-30 NOTE — Progress Notes (Signed)
[facility] [address] [place] [address]   Clinic Day:  07/30/2022  Referring physician: Babs Sciara, MD  Patient Care Team: Babs Sciara, MD as PCP - General (Family Medicine)   ASSESSMENT & PLAN:   Assessment: ***  Plan: ***  No orders of the defined types were placed in this encounter.     Alben Deeds Teague,acting as a Neurosurgeon for Doreatha Massed, MD.,have documented all relevant documentation on the behalf of Doreatha Massed, MD,as directed by  Doreatha Massed, MD while in the presence of Doreatha Massed, MD.   ***  [place] R Teague   7/16/20247:29 PM  CHIEF COMPLAINT/PURPOSE OF CONSULT:   Diagnosis: ***  Cancer Staging  Adenocarcinoma of breast [facility]) Staging form: Breast, AJCC 7th Edition - Clinical: Stage IA (T1b, N0, cM0) - Signed by Ellouise Newer, PA on 05/20/2011    Prior Therapy: ***  Current Therapy:  ***   HISTORY OF PRESENT ILLNESS:   Oncology History   No history exists.      Melissa Rodriguez is a 57 y.o. female presenting to clinic today for evaluation of left breast cancer at the request of Luking, Jonna Coup, MD.  Her most recent bilateral mammogram on 07/16/22 found a highly suspicious palpable 1.9 cm mass in the left breast at the 1 o'clock position and a suspicious 1.1 cm group of calcifications in the lower slightly outer right breast. She was recommended to have an ultrasound-guided core biopsy of the 1.9 cm mass in the left breast at the 1 o'clock position and a stereotactic guided biopsy of the 1.1 cm group of calcifications in the lower slightly outer right breast. [place] including the axilla left breast on 7/2 found the same results as above.   Biopsy from left breast done on 7/2 found invasive ductal carcinoma. Biopsy of the right breast done on 7/12 found benign breast with fibrocystic changes including stromal fibrosis, cystic dilation of ducts, apocrine metaplasia, adenosis and usual duct hyperplasia,  microcalcifications present within cystic apocrine metaplasia and adenosis.   Today, she states that she is doing well overall. Her appetite level is at ***%. Her energy level is at ***%.  PAST MEDICAL HISTORY:   Past Medical History: Past Medical History:  Diagnosis Date   Adenocarcinoma of breast (HCC) 05/20/2011   Stage I (T1b N0), grade 1, well-differentiated adenocarcinoma of the medial lower quadrant of the left breast. It was a tubular carcinoma, status post lumpectomy on 03/13/2006, ER positive 79%, PR positive 92%, HER2/neu negative, Ki-67 marker low at 18%. Her cancer was 7 mm in size, and she had 4 sentinel nodes all negative along with her lumpectomy. She was treated with radiation therapy postoper   History of kidney stones    Iron deficiency    Iron deficiency anemia 05/20/2011   Secondary to GU bleeding.  Good absorber of PO iron   Personal history of radiation therapy     Surgical History: Past Surgical History:  Procedure Laterality Date   BREAST BIOPSY Left 07/16/2022   [place] LT BREAST BX W LOC DEV 1ST LESION IMG BX SPEC US GUIDE 07/16/2022 Edwin Cap, MD [facility]   BREAST BIOPSY Right 07/26/2022   MM RT BREAST BX W LOC DEV 1ST LESION IMAGE BX SPEC STEREO GUIDE 07/26/2022 [facility]   BREAST LUMPECTOMY Left    EXTRACORPOREAL SHOCK WAVE LITHOTRIPSY Left 02/26/2018   Procedure: EXTRACORPOREAL SHOCK WAVE LITHOTRIPSY (ESWL);  Surgeon: Malen Gauze, MD;  Location: [facility];  Service: Urology;  Laterality: Left;   LUMPECTOM LEFT BREAST  02/2006   LYMPH NODE BIOPSY LEFT  04/2006    Social History: Social History   Socioeconomic History   Marital status: Married    Spouse name: Not on file   Number of children: Not on file   Years of education: Not on file   Highest education level: Not on file  Occupational History   Not on file  Tobacco Use   Smoking status: Never   Smokeless tobacco: Never  Vaping Use   Vaping status: Never Used  Substance and  Sexual Activity   Alcohol use: No   Drug use: No   Sexual activity: Yes  Other Topics Concern   Not on file  Social History Narrative   Not on file   Social Determinants of Health   Financial Resource Strain: Not on file  Food Insecurity: Not on file  Transportation Needs: Not on file  Physical Activity: Not on file  Stress: Not on file  Social Connections: Not on file  Intimate Partner Violence: Not on file    Family History: Family History  Problem Relation Age of Onset   Hypertension Mother    Hypertension Father    Heart disease Father 48       MI in his 55s   Cancer Maternal Aunt    Cancer Paternal Uncle     Current Medications:  Current Outpatient Medications:    acetaminophen (TYLENOL) 500 MG tablet, Take 1,000 mg by mouth every 6 (six) hours as needed. (Patient not taking: Reported on 07/11/2022), Disp: , Rfl:    Bioflavonoid Products (GRAPE SEED PO), Take 1 capsule by mouth daily., Disp: , Rfl:    Calcium Carbonate Antacid (TUMS PO), Take 1 tablet by mouth as needed. Reported on 05/22/2015, Disp: , Rfl:    Cholecalciferol (VITAMIN D PO), Take 1,000 Units by mouth daily. , Disp: , Rfl:    Loratadine-Pseudoephedrine (CLARITIN-D 24 HOUR PO), Take 1 tablet by mouth daily. Reported on 05/22/2015 (Patient not taking: Reported on 07/11/2022), Disp: , Rfl:    Multiple Vitamin (MULTIVITAMIN) tablet, Take 1 tablet by mouth daily.   (Patient not taking: Reported on 07/11/2022), Disp: , Rfl:    Allergies: Allergies  Allergen Reactions   Benadryl [Diphenhydramine Hcl]     Jittery and Hallucinations   Hydrocodone Shortness Of Breath    Felt like she could not breathe-February 2020   Ciprofloxacin Hives   Penicillins Hives    Did it involve swelling of the face/tongue/throat, SOB, or low BP? No Did it involve sudden or severe rash/hives, skin peeling, or any reaction on the inside of your mouth or nose? No Did you need to seek medical attention at a hospital or doctor's office?  No When did it last happen?       If all above answers are "NO", may proceed with cephalosporin use.     REVIEW OF SYSTEMS:   Review of Systems - Oncology   VITALS:   Last menstrual period 07/05/2012.  Wt Readings from Last 3 Encounters:  07/11/22 99 lb 6.4 oz (45.1 kg)  06/23/18 97 lb (44 kg)  02/26/18 101 lb (45.8 kg)    There is no height or weight on file to calculate BMI.  Performance status (ECOG): {CHL ONC [id number]  PHYSICAL EXAM:   Physical Exam  LABS:      Latest Ref Rng & Units 02/22/2018   11:09 AM 05/17/2013    8:19 AM 11/18/2012  8:35 AM  CBC  WBC 4.0 - 10.5 K/uL 9.3  9.4  5.6   Hemoglobin 12.0 - 15.0 g/dL 04.5  40.9  81.1   Hematocrit 36.0 - 46.0 % 43.1  40.2  44.4   Platelets 150 - 400 K/uL 174  120  150       Latest Ref Rng & Units 02/22/2018   11:09 AM 05/17/2013    8:19 AM 05/18/2012    9:39 AM  CMP  Glucose 70 - 99 mg/dL 914  97  84   BUN 6 - 20 mg/dL 17  10  16    Creatinine 0.44 - 1.00 mg/dL 7.82  9.56  2.13   Sodium 135 - 145 mmol/L 140  141  137   Potassium 3.5 - 5.1 mmol/L 3.8  3.9  3.5   Chloride 98 - 111 mmol/L 103  101  99   CO2 22 - 32 mmol/L 27  32  29   Calcium 8.9 - 10.3 mg/dL 08.6  9.9  9.6   Total Protein 6.5 - 8.1 g/dL 7.4  6.8  7.0   Total Bilirubin 0.3 - 1.2 mg/dL 0.7  0.6  0.3   Alkaline Phos 38 - 126 U/L 45  57  55   AST 15 - 41 U/L 26  20  20    ALT 0 - 44 U/L 21  12  13       No results found for: "CEA1", "CEA" / No results found for: "CEA1", "CEA" No results found for: "PSA1" No results found for: "VHQ469" No results found for: "CAN125"  No results found for: "TOTALPROTELP", "ALBUMINELP", "A1GS", "A2GS", "BETS", "BETA2SER", "GAMS", "MSPIKE", "SPEI" Lab Results  Component Value Date   TIBC 242 (L) 05/17/2013   TIBC 292 11/18/2012   TIBC 314 05/18/2012   FERRITIN 95 05/17/2013   FERRITIN 59 11/18/2012   FERRITIN 25 05/18/2012   IRONPCTSAT 16 (L) 05/17/2013   IRONPCTSAT 39 11/18/2012   IRONPCTSAT 23 05/18/2012    No results found for: "LDH"   STUDIES:   MM RT BREAST BX W LOC DEV 1ST LESION IMAGE BX SPEC STEREO GUIDE  Addendum Date: 07/30/2022   ADDENDUM REPORT: 07/30/2022 07:30 ADDENDUM: Pathology revealed BENIGN BREAST WITH FIBROCYSTIC CHANGES INCLUDING STROMAL FIBROSIS, CYSTIC DILATATION OF DUCTS, APOCRINE METAPLASIA, ADENOSIS AND USUAL DUCT HYPERPLASIA, MICROCALCIFICATIONS PRESENT WITHIN CYSTIC APOCRINE METAPLASIA AND ADENOSIS of the RIGHT breast, lower outer, (x clip). This was found to be concordant by Dr. Edwin Cap. Pathology results were discussed with the patient by telephone. The patient reported doing well after the biopsy with tenderness at the site. Post biopsy instructions and care were reviewed and questions were answered. The patient was encouraged to call [facility] for any additional concerns. My direct phone number was provided. The patient has a recent diagnosis of LEFT breast cancer and should follow her outlined treatment plan. Medical oncology consultation has been arranged with Dr. Ellin Saba at [facility] [place] location, on July 31, 2022. Surgical consultation has been arranged with Dr. Harriette Bouillon at [facility] on August 05, 2022. Pathology results reported by Rene Kocher, RN on 07/29/2022. Electronically Signed   By: Edwin Cap M.D.   On: 07/30/2022 07:30   Result Date: 07/30/2022 CLINICAL DATA:  Patient presents for stereotactic guided biopsy of a 1.1 cm group of calcifications in the lower outer right breast. Patient was recently diagnosed with invasive ductal carcinoma of the left breast. EXAM: RIGHT BREAST  STEREOTACTIC CORE NEEDLE BIOPSY COMPARISON:  Previous exam(s). FINDINGS: The patient and I discussed the procedure of stereotactic-guided biopsy including benefits and alternatives. We discussed the high likelihood of a successful procedure. We discussed the risks of the procedure including  infection, bleeding, tissue injury, clip migration, and inadequate sampling. Informed written consent was given. The usual time out protocol was performed immediately prior to the procedure. Using sterile technique and 1% Lidocaine as local anesthetic, under stereotactic guidance, a 9 gauge vacuum assisted device was used to perform core needle biopsy of calcifications in the lower outer right breast using a lateral to medial approach. Specimen radiograph was performed showing the presence of calcifications. Specimens with calcifications are identified for pathology. Lesion quadrant: Lower outer At the conclusion of the procedure, an X shaped tissue marker clip was deployed into the biopsy cavity. Follow-up 2-view mammogram was performed and dictated separately. IMPRESSION: Stereotactic-guided biopsy of the calcifications in the lower outer right breast. No apparent complications. Electronically Signed: By: Edwin Cap M.D. On: 07/26/2022 09:11   MM CLIP PLACEMENT RIGHT  Result Date: 07/26/2022 CLINICAL DATA:  Post stereotactic guided biopsy of calcifications in the lower outer right breast. EXAM: 3D DIAGNOSTIC RIGHT MAMMOGRAM POST STEREOTACTIC BIOPSY COMPARISON:  Previous exam(s). FINDINGS: 3D Mammographic images were obtained following stereotactic guided biopsy a 1.1 cm group of calcifications in the lower outer right breast. The X shaped biopsy marking clip is not identified and most likely extruded following placement due to some bleeding and or with compression of the mammogram due to superficial location. A few residual calcifications are present at the biopsy site which can be targeted if needed for localization. IMPRESSION: Biopsy marking clip not present, however a few residual calcifications are present at the biopsy site which can be targeted if needed for localization purposes. Final Assessment: Post Procedure Mammograms for Marker Placement Electronically Signed   By: Edwin Cap M.D.    On: 07/26/2022 09:19   [place] LT BREAST BX W LOC DEV 1ST LESION IMG BX SPEC US GUIDE  Addendum Date: 07/23/2022   ADDENDUM REPORT: 07/23/2022 07:31 ADDENDUM: PATHOLOGY revealed: A. BREAST, LEFT 1 OC, BIOPSY: - Invasive ductal carcinoma. - Overall Grade: 3. - Lymphovascular invasion: Not identified. - Cancer Length: 0.9 cm. - Calcifications: Not identified. Pathology results are CONCORDANT with imaging findings, per Dr. Edwin Cap. Pathology results and recommendations were discussed with patient via telephone on 07/17/2022. Patient reported biopsy site doing well with no adverse symptoms, and only slight tenderness at the site. Post biopsy care instructions were reviewed, questions were answered and my direct phone number was provided. Patient was instructed to call  [facility] Mammography Department for any additional questions or concerns related to biopsy site. RECOMMENDATION: Surgical and oncological consultation. Request for surgical and oncological consultation was relayed to Ellin Mayhew RT at [facility] Mammography Department by Randa Lynn RN on 07/17/2022. Pathology results reported by Randa Lynn RN on 07/22/2022. Electronically Signed   By: Edwin Cap M.D.   On: 07/23/2022 07:31   Result Date: 07/23/2022 CLINICAL DATA:  Patient presents for ultrasound-guided core biopsy of a suspicious 1.9 cm mass in the left breast at 1 o'clock 7 cm from nipple. EXAM: ULTRASOUND GUIDED LEFT BREAST CORE NEEDLE BIOPSY COMPARISON:  Previous exam(s). PROCEDURE: I met with the patient and we discussed the procedure of ultrasound-guided biopsy, including benefits and alternatives. We discussed the high likelihood of a successful procedure. We discussed the risks of the procedure, including infection,  bleeding, tissue injury, clip migration, and inadequate sampling. Informed written consent was given. The usual time-out protocol was performed immediately prior to the procedure. Lesion  quadrant: Upper outer Using sterile technique and 1% Lidocaine as local anesthetic, under direct ultrasound visualization, a 14 gauge spring-loaded device was used to perform biopsy of the mass in the left breast at the 1 o'clock position using a lateral to medial approach. At the conclusion of the procedure a ribbon shaped tissue marker clip was deployed into the biopsy cavity. Follow up 2 view mammogram was performed and dictated separately. IMPRESSION: Ultrasound guided biopsy of the mass in the left breast at the 1 o'clock position. No apparent complications. Electronically Signed: By: Edwin Cap M.D. On: 07/16/2022 09:40   MM CLIP PLACEMENT LEFT  Result Date: 07/16/2022 CLINICAL DATA:  Post ultrasound-guided biopsy of a 1.9 cm mass in the left breast at the 1 o'clock position. EXAM: 3D DIAGNOSTIC LEFT MAMMOGRAM POST ULTRASOUND BIOPSY COMPARISON:  Previous exam(s). FINDINGS: 3D Mammographic images were obtained following ultrasound-guided core biopsy of a mass in the left breast at the 1 o'clock position. A ribbon shaped biopsy marking clip is present at the site of the biopsied mass in the left breast at the 1 o'clock position. This could only be included in the field of view on the MLO images. IMPRESSION: Ribbon shaped biopsy marking clip at site of biopsied mass in the left breast at the 1 o'clock position. Final Assessment: Post Procedure Mammograms for Marker Placement Electronically Signed   By: Edwin Cap M.D.   On: 07/16/2022 09:39  MM 3D DIAGNOSTIC MAMMOGRAM BILATERAL BREAST  Addendum Date: 07/16/2022   ADDENDUM REPORT: 07/16/2022 09:03 ADDENDUM: An addendum is made to the findings section of this exam due to an omission. This should include: NO lymphadenopathy seen in the left axilla. Electronically Signed   By: Edwin Cap M.D.   On: 07/16/2022 09:03   Result Date: 07/16/2022 CLINICAL DATA:  57 year old female with a palpable area of concern in the left breast. History of left  breast cancer post lumpectomy 04/15/2006. EXAM: DIGITAL DIAGNOSTIC BILATERAL MAMMOGRAM WITH TOMOSYNTHESIS AND CAD; ULTRASOUND LEFT BREAST LIMITED TECHNIQUE: Bilateral digital diagnostic mammography and breast tomosynthesis was performed. The images were evaluated with computer-aided detection. ; Targeted ultrasound examination of the left breast was performed. COMPARISON:  Previous exam(s). ACR Breast Density Category c: The breasts are heterogeneously dense, which may obscure small masses. FINDINGS: There is an irregular mass containing calcifications at the site of palpable concern in the far upper outer posterior left breast. This could not be fully obtained within the field of view but measures approximally 2.4 cm. A stable lymph node is seen in the superior left breast on the MLO view, unchanged dating back to prior mammograms from 2010. There are linear oriented mixed round and punctate calcifications in the lower slightly outer right breast spanning 1.1 cm. Physical examination of the left breast reveals a firm mass in the upper slightly outer left breast. Targeted ultrasound of the left breast was performed. There is an irregular hypoechoic mass in the left breast at 1 o'clock 7 cm from nipple measuring 1.9 x 0.9 x 1 4 cm. This corresponds well with the mass seen in the upper slightly outer left breast at mammography. IMPRESSION: 1. Highly suspicious palpable 1.9 cm mass in the left breast at the 1 o'clock position. 2. Suspicious 1.1 cm group of calcifications in the lower slightly outer right breast. RECOMMENDATION: 1. Recommend ultrasound-guided core biopsy of  the 1.9 cm mass in the left breast at the 1 o'clock position. This will subsequently be performed and dictated separately. 2. Recommend stereotactic guided biopsy of the 1.1 cm group of calcifications in the lower slightly outer right breast. I have discussed the findings and recommendations with the patient. If applicable, a reminder letter will be  sent to the patient regarding the next appointment. BI-RADS CATEGORY  5: Highly suggestive of malignancy. Electronically Signed: By: Edwin Cap M.D. On: 07/16/2022 08:56   [place] LIMITED ULTRASOUND INCLUDING AXILLA LEFT BREAST   Addendum Date: 07/16/2022   ADDENDUM REPORT: 07/16/2022 09:03 ADDENDUM: An addendum is made to the findings section of this exam due to an omission. This should include: NO lymphadenopathy seen in the left axilla. Electronically Signed   By: Edwin Cap M.D.   On: 07/16/2022 09:03   Result Date: 07/16/2022 CLINICAL DATA:  57 year old female with a palpable area of concern in the left breast. History of left breast cancer post lumpectomy 04/15/2006. EXAM: DIGITAL DIAGNOSTIC BILATERAL MAMMOGRAM WITH TOMOSYNTHESIS AND CAD; ULTRASOUND LEFT BREAST LIMITED TECHNIQUE: Bilateral digital diagnostic mammography and breast tomosynthesis was performed. The images were evaluated with computer-aided detection. ; Targeted ultrasound examination of the left breast was performed. COMPARISON:  Previous exam(s). ACR Breast Density Category c: The breasts are heterogeneously dense, which may obscure small masses. FINDINGS: There is an irregular mass containing calcifications at the site of palpable concern in the far upper outer posterior left breast. This could not be fully obtained within the field of view but measures approximally 2.4 cm. A stable lymph node is seen in the superior left breast on the MLO view, unchanged dating back to prior mammograms from 2010. There are linear oriented mixed round and punctate calcifications in the lower slightly outer right breast spanning 1.1 cm. Physical examination of the left breast reveals a firm mass in the upper slightly outer left breast. Targeted ultrasound of the left breast was performed. There is an irregular hypoechoic mass in the left breast at 1 o'clock 7 cm from nipple measuring 1.9 x 0.9 x 1 4 cm. This corresponds well with the mass seen in the  upper slightly outer left breast at mammography. IMPRESSION: 1. Highly suspicious palpable 1.9 cm mass in the left breast at the 1 o'clock position. 2. Suspicious 1.1 cm group of calcifications in the lower slightly outer right breast. RECOMMENDATION: 1. Recommend ultrasound-guided core biopsy of the 1.9 cm mass in the left breast at the 1 o'clock position. This will subsequently be performed and dictated separately. 2. Recommend stereotactic guided biopsy of the 1.1 cm group of calcifications in the lower slightly outer right breast. I have discussed the findings and recommendations with the patient. If applicable, a reminder letter will be sent to the patient regarding the next appointment. BI-RADS CATEGORY  5: Highly suggestive of malignancy. Electronically Signed: By: Edwin Cap M.D. On: 07/16/2022 08:56

## 2022-07-31 ENCOUNTER — Inpatient Hospital Stay: Payer: Commercial Managed Care - PPO | Attending: Hematology | Admitting: Hematology

## 2022-07-31 ENCOUNTER — Telehealth: Payer: Self-pay | Admitting: Licensed Clinical Social Worker

## 2022-07-31 ENCOUNTER — Inpatient Hospital Stay: Payer: Commercial Managed Care - PPO

## 2022-07-31 VITALS — BP 139/61 | HR 99 | Temp 98.0°F | Resp 18 | Ht 63.0 in | Wt 97.6 lb

## 2022-07-31 DIAGNOSIS — Z170 Estrogen receptor positive status [ER+]: Secondary | ICD-10-CM | POA: Diagnosis present

## 2022-07-31 DIAGNOSIS — N6081 Other benign mammary dysplasias of right breast: Secondary | ICD-10-CM | POA: Diagnosis not present

## 2022-07-31 DIAGNOSIS — Z853 Personal history of malignant neoplasm of breast: Secondary | ICD-10-CM | POA: Diagnosis not present

## 2022-07-31 DIAGNOSIS — Z803 Family history of malignant neoplasm of breast: Secondary | ICD-10-CM | POA: Diagnosis not present

## 2022-07-31 DIAGNOSIS — C50412 Malignant neoplasm of upper-outer quadrant of left female breast: Secondary | ICD-10-CM | POA: Diagnosis present

## 2022-07-31 LAB — CBC WITH DIFFERENTIAL/PLATELET
Abs Immature Granulocytes: 0.02 10*3/uL (ref 0.00–0.07)
Basophils Absolute: 0 10*3/uL (ref 0.0–0.1)
Basophils Relative: 0 %
Eosinophils Absolute: 0 10*3/uL (ref 0.0–0.5)
Eosinophils Relative: 0 %
HCT: 42.7 % (ref 36.0–46.0)
Hemoglobin: 13.9 g/dL (ref 12.0–15.0)
Immature Granulocytes: 0 %
Lymphocytes Relative: 14 %
Lymphs Abs: 1.2 10*3/uL (ref 0.7–4.0)
MCH: 29.4 pg (ref 26.0–34.0)
MCHC: 32.6 g/dL (ref 30.0–36.0)
MCV: 90.3 fL (ref 80.0–100.0)
Monocytes Absolute: 0.5 10*3/uL (ref 0.1–1.0)
Monocytes Relative: 6 %
Neutro Abs: 6.7 10*3/uL (ref 1.7–7.7)
Neutrophils Relative %: 80 %
Platelets: 170 10*3/uL (ref 150–400)
RBC: 4.73 MIL/uL (ref 3.87–5.11)
RDW: 12.8 % (ref 11.5–15.5)
WBC: 8.4 10*3/uL (ref 4.0–10.5)
nRBC: 0 % (ref 0.0–0.2)

## 2022-07-31 LAB — COMPREHENSIVE METABOLIC PANEL
ALT: 13 U/L (ref 0–44)
AST: 14 U/L — ABNORMAL LOW (ref 15–41)
Albumin: 4.2 g/dL (ref 3.5–5.0)
Alkaline Phosphatase: 49 U/L (ref 38–126)
Anion gap: 6 (ref 5–15)
BUN: 9 mg/dL (ref 6–20)
CO2: 29 mmol/L (ref 22–32)
Calcium: 9.3 mg/dL (ref 8.9–10.3)
Chloride: 101 mmol/L (ref 98–111)
Creatinine, Ser: 0.74 mg/dL (ref 0.44–1.00)
GFR, Estimated: >60 mL/min (ref >60)
Glucose, Bld: 102 mg/dL — ABNORMAL HIGH (ref 70–99)
Potassium: 3.5 mmol/L (ref 3.5–5.1)
Sodium: 136 mmol/L (ref 135–145)
Total Bilirubin: 0.7 mg/dL (ref 0.3–1.2)
Total Protein: 6.5 g/dL (ref 6.5–8.1)

## 2022-07-31 LAB — GENETIC SCREENING ORDER

## 2022-07-31 LAB — VITAMIN D 25 HYDROXY (VIT D DEFICIENCY, FRACTURES): Vit D, 25-Hydroxy: 51.24 ng/mL (ref 30–100)

## 2022-07-31 NOTE — Patient Instructions (Addendum)
[facility]  Discharge Instructions  You were seen and examined today by Dr. Ellin Saba. Dr. Ellin Saba is a medical oncologist, meaning that he specializes in the treatment of cancer diagnoses. Dr. Ellin Saba discussed your past medical history, family history of cancers, and the events that led to you being here today.  You were referred to Dr. Ellin Saba due to a new diagnosis of breast cancer. Your recent biopsy revealed a type of breast cancer known as HER2 positive. This is typically associated with more aggressive disease. There was no evidence of lymph node involvement. This is good news.  We will refer you for Surgical Evaluation. Because this is a recurrence, the recommendation is for complete removal of the breast. If there is a role for chemotherapy following surgery, Dr. Ellin Saba will discuss that with you after surgery. It is an almost certainty that you will need an anti-HER2 agent to be given every 3 weeks for a year, this medication is typically combined with chemotherapy for the first few months of treatment.  Dr. Ellin Saba will arrange for you to have a bone density and echocardiogram in preparation for possible treatments following surgery.  Dr. Ellin Saba will also draw genetic testing labs today as well as routing blood work.  Follow-up as scheduled.  Thank you for choosing [facility] to provide your oncology and hematology care.   To afford each patient quality time with our provider, please arrive at least 15 minutes before your scheduled appointment time. You may need to reschedule your appointment if you arrive late (10 or more minutes). Arriving late affects you and other patients whose appointments are after yours.  Also, if you miss three or more appointments without notifying the office, you may be dismissed from the clinic at the provider's discretion.    Again, thank you for choosing [facility].  Our hope is that these requests will decrease the amount of time that you wait before being seen by our physicians.   If you have a lab appointment with the Cancer Center - please note that after April 8th, all labs will be drawn in the cancer center.  You do not have to check in or register with the main entrance as you have in the past but will complete your check-in at the cancer center.            _____________________________________________________________  Should you have questions after your visit to [facility], please contact our office at [phone] and follow the prompts.  Our office hours are 8:00 a.m. to 4:30 p.m. Monday - Thursday and 8:00 a.m. to 2:30 p.m. Friday.  Please note that voicemails left after 4:00 p.m. may not be returned until the following business day.  We are closed weekends and all major holidays.  You do have access to a nurse 24-7, just call the main number to the clinic [phone] and do not press any options, hold on the line and a nurse will answer the phone.    For prescription refill requests, have your pharmacy contact our office and allow 72 hours.    Masks are no longer required in the cancer centers. If you would like for your care team to wear a mask while they are taking care of you, please let them know. You may have one support person who is at least 59 years old accompany you for your appointments.

## 2022-07-31 NOTE — Telephone Encounter (Signed)
Placed order for [facility] Breast Cancer STAT Panel + Common Hereditary Cancers Panel per message from Kennith Gain.   Lacy Duverney, MS, [facility] Genetic Counselor [email or web address] Phone: [phone]

## 2022-08-05 ENCOUNTER — Ambulatory Visit: Payer: Self-pay | Admitting: Surgery

## 2022-08-07 ENCOUNTER — Encounter (HOSPITAL_BASED_OUTPATIENT_CLINIC_OR_DEPARTMENT_OTHER): Payer: Self-pay | Admitting: Surgery

## 2022-08-07 ENCOUNTER — Encounter: Payer: Self-pay | Admitting: Licensed Clinical Social Worker

## 2022-08-07 DIAGNOSIS — Z1501 Genetic susceptibility to malignant neoplasm of breast: Secondary | ICD-10-CM | POA: Insufficient documentation

## 2022-08-07 DIAGNOSIS — Z1379 Encounter for other screening for genetic and chromosomal anomalies: Secondary | ICD-10-CM | POA: Insufficient documentation

## 2022-08-08 NOTE — Progress Notes (Signed)
Genetic testing results received and reviewed by Dr. Ellin Saba. BRCA2+ results were revealed per Dr. Ellin Saba. Patient called. Results expressed to patient who is proceed with double mastectomy per Dr. Marice Potter recommendation. Patient requests a call from genetic counseling to discuss testing options and indications for her family members, specifically her children. Genetic counseling aware of the above conversation and to reach out to the patient later today. All questions addressed and answered to the patient's satisfaction.

## 2022-08-09 ENCOUNTER — Telehealth: Payer: Self-pay | Admitting: Licensed Clinical Social Worker

## 2022-08-09 ENCOUNTER — Encounter: Payer: Self-pay | Admitting: Licensed Clinical Social Worker

## 2022-08-09 NOTE — Telephone Encounter (Signed)
Called patient to schedule genetics appointment to review BRCA2 result. Will see patient Wednesday 7/31 at 10 am.

## 2022-08-12 ENCOUNTER — Ambulatory Visit (HOSPITAL_COMMUNITY)
Admission: RE | Admit: 2022-08-12 | Discharge: 2022-08-12 | Disposition: A | Discharge status: Home / Self Care | Payer: Commercial Managed Care - PPO | Source: Ambulatory Visit | Attending: Hematology | Admitting: Hematology

## 2022-08-12 ENCOUNTER — Encounter: Payer: Self-pay | Admitting: Licensed Clinical Social Worker

## 2022-08-12 DIAGNOSIS — Z0189 Encounter for other specified special examinations: Secondary | ICD-10-CM | POA: Diagnosis not present

## 2022-08-12 DIAGNOSIS — Z170 Estrogen receptor positive status [ER+]: Secondary | ICD-10-CM

## 2022-08-12 DIAGNOSIS — C50412 Malignant neoplasm of upper-outer quadrant of left female breast: Secondary | ICD-10-CM

## 2022-08-12 LAB — ECHOCARDIOGRAM COMPLETE
AR max vel: 2.11 cm2
AV Area VTI: 2.22 cm2
AV Area mean vel: 2.12 cm2
AV Mean grad: 3.2 mmHg
AV Peak grad: 7 mmHg
Ao pk vel: 1.32 m/s
Area-P 1/2: 5.66 cm2
S' Lateral: 2.3 cm

## 2022-08-12 NOTE — Progress Notes (Signed)
*  PRELIMINARY RESULTS* Echocardiogram 2D Echocardiogram has been performed.  Stacey Drain 08/12/2022, 12:08 PM

## 2022-08-13 ENCOUNTER — Ambulatory Visit (HOSPITAL_COMMUNITY)
Admission: RE | Admit: 2022-08-13 | Discharge: 2022-08-13 | Disposition: A | Discharge status: Home / Self Care | Payer: Commercial Managed Care - PPO | Source: Ambulatory Visit | Attending: Hematology | Admitting: Hematology

## 2022-08-13 DIAGNOSIS — Z170 Estrogen receptor positive status [ER+]: Secondary | ICD-10-CM | POA: Diagnosis present

## 2022-08-13 DIAGNOSIS — C50412 Malignant neoplasm of upper-outer quadrant of left female breast: Secondary | ICD-10-CM | POA: Insufficient documentation

## 2022-08-14 ENCOUNTER — Inpatient Hospital Stay: Payer: Commercial Managed Care - PPO | Admitting: Licensed Clinical Social Worker

## 2022-08-14 ENCOUNTER — Encounter: Payer: Self-pay | Admitting: Licensed Clinical Social Worker

## 2022-08-14 DIAGNOSIS — Z803 Family history of malignant neoplasm of breast: Secondary | ICD-10-CM

## 2022-08-14 DIAGNOSIS — Z170 Estrogen receptor positive status [ER+]: Secondary | ICD-10-CM

## 2022-08-14 DIAGNOSIS — Z1379 Encounter for other screening for genetic and chromosomal anomalies: Secondary | ICD-10-CM

## 2022-08-14 DIAGNOSIS — Z1501 Genetic susceptibility to malignant neoplasm of breast: Secondary | ICD-10-CM

## 2022-08-14 NOTE — Progress Notes (Signed)
REFERRING PROVIDER: Doreatha Massed, MD [address] [place],  [place] [address]  PRIMARY PROVIDER:  Babs Sciara, MD  PRIMARY REASON FOR VISIT:  1. BRCA2 gene mutation positive   2. Malignant neoplasm of upper-outer quadrant of left breast in female, estrogen receptor positive (HCC)   3. Genetic testing   4. Family history of breast cancer      HISTORY OF PRESENT ILLNESS:   Melissa Rodriguez, a 57 y.o. female, was seen for a [place] cancer genetics consultation at the request of Dr. Ellin Saba due to a personal and family history of cancer and genetic testing that showed BRCA2 mutation.  Melissa Rodriguez presents to clinic today to discuss the possibility of a hereditary predisposition to cancer, genetic testing, and to further clarify her future cancer risks, as well as potential cancer risks for family members.   CANCER HISTORY:  In 2008, at the age of 57, Melissa Rodriguez was diagnosed with left breast cancer. This was treated with lumpectomy and radiation.  In 2024, at the age of 50, Melissa Rodriguez was diagnosed with IDC of the left breast. The treatment plan includes bilateral mastectomy planned for 08/15/2022, adjuvant chemotherapy.   Past Medical History:  Diagnosis Date   Adenocarcinoma of breast (HCC) 05/20/2011   Stage I (T1b N0), grade 1, well-differentiated adenocarcinoma of the medial lower quadrant of the left breast. It was a tubular carcinoma, status post lumpectomy on 03/13/2006, ER positive 79%, PR positive 92%, HER2/neu negative, Ki-67 marker low at 18%. Her cancer was 7 mm in size, and she had 4 sentinel nodes all negative along with her lumpectomy. She was treated with radiation therapy postoper   Complication of anesthesia    pt states her bladder did not wake up after one surgery. Went to ER, had a urinary catheter for 1 week   History of kidney stones    Iron deficiency    Iron deficiency anemia 05/20/2011   Secondary to GU bleeding.  Good absorber of PO iron   Personal  history of radiation therapy     Past Surgical History:  Procedure Laterality Date   BREAST BIOPSY Left 07/16/2022   [place] LT BREAST BX W LOC DEV 1ST LESION IMG BX SPEC US GUIDE 07/16/2022 Edwin Cap, MD [facility]   BREAST BIOPSY Right 07/26/2022   MM RT BREAST BX W LOC DEV 1ST LESION IMAGE BX SPEC STEREO GUIDE 07/26/2022 [facility]   BREAST LUMPECTOMY Left    EXTRACORPOREAL SHOCK WAVE LITHOTRIPSY Left 02/26/2018   Procedure: EXTRACORPOREAL SHOCK WAVE LITHOTRIPSY (ESWL);  Surgeon: Malen Gauze, MD;  Location: [facility];  Service: Urology;  Laterality: Left;   LUMPECTOM LEFT BREAST  02/2006   LYMPH NODE BIOPSY LEFT  04/2006    FAMILY HISTORY:  We obtained a detailed, 4-generation family history.  Significant diagnoses are listed below: Family History  Problem Relation Age of Onset   Hypertension Mother    Hypertension Father    Heart disease Father 65       MI in his 84s   Cancer Maternal Aunt        unk type, possibly hip. dx >50   Breast cancer Paternal Aunt        dx 48s   Cancer Paternal Aunt        unknown, d. 74s   Prostate cancer Cousin        dx 79s-50s   Stomach cancer Cousin        dx 39s-50s   Ms.  Rodriguez has 1 daughter, 52, and 2 sons, 70 and 22, no cancers. They all live in the area. Melissa Rodriguez also has a sister who reportedly had negative genetic testing in 2008.   Melissa Rodriguez mother is living at 57. A maternal aunt had cancer, unknown type, possibly hip cancer. A maternal cousin had prostate cancer in his 77s-50s and his sister had stomach cancer in her 51s-50s.   Melissa Rodriguez father is living at 34. A paternal aunt had breast cancer in her 87s. Another aunt had cancer in her 18s, unknown type.  Melissa Rodriguez is unaware of previous family history of genetic testing for hereditary cancer risks. There is no reported Ashkenazi Jewish ancestry. There is no known consanguinity.    GENETIC COUNSELING ASSESSMENT: Melissa Rodriguez is a 57 y.o. female with a  personal history of breast cancer and genetic testing that showed a BRCA2 mutation. We, therefore, discussed and recommended the following at today's visit.   DISCUSSION: We discussed that approximately 10% of cancer is hereditary.  We discussed that testing is beneficial for several reasons including knowing about cancer risks, identifying potential screening and risk-reduction options that may be appropriate, and to understand if other family members could be at risk for cancer and allow them to undergo genetic testing.   GENETIC TESTING: Melissa Rodriguez tested positive for a single pathogenic variant (harmful genetic change) in the BRCA2 gene. Specifically, this variant is [phone] (p.[id number]). The remainder of genes tested on the [facility] Common Hereditary Cancers Panel were normal.  The Common Hereditary Cancers Panel + RNA offered by [facility] includes sequencing and/or deletion duplication testing of the following 48 genes: APC*, ATM*, AXIN2, BAP1, BARD1, BMPR1A, BRCA1, BRCA2, BRIP1, CDH1, CDK4, CDKN2A (p14ARF), CDKN2A (p16INK4a), CHEK2, CTNNA1, DICER1*, EPCAM*, FH*, GREM1*, HOXB13, KIT, MBD4, MEN1*, MLH1*, MSH2*, MSH3*, MSH6*, MUTYH, NF1*, NTHL1, PALB2, PDGFRA, PMS2*, POLD1*, POLE, PTEN*, RAD51C, RAD51D, SDHA*, SDHB, SDHC*, SDHD, SMAD4, SMARCA4, STK11, TP53, TSC1*, TSC2, VHL.  The test report has been scanned into EPIC and is located under the Molecular Pathology section of the Results Review tab.  A portion of the result report is included below for reference. Genetic testing reported out on 08/11/2022.     Clinical Information: Hereditary breast and ovarian cancer (HBOC) syndrome is characterized by an increased lifetime risk for, generally, adult-onset cancers including, breast, contralateral breast, female breast, ovarian, prostate, melanoma and pancreatic.  The cancers associated with BRCA2 are: Female breast cancer, up to an 84% risk In women with a history of breast cancer, the  risk for contralateral breast cancer 10 years after breast cancer diagnosis is 10-30%.  Female breast cancer, up to an 8% risk Ovarian cancer, 13-29% risk Pancreatic cancer, 5-10% risk Prostate cancer, 19-61% risk Melanoma, elevated risk   Management Recommendations:  Breast Screening/Risk Reduction:  Women: Breast awareness starting at age 34 Clinical breast examination every 6-12 months starting at age 35  Breast cancer screening: Age 35-29 years, annual breast MRI with and without contrast (or mammogram, if MRI is unavailable), although the age to initiate screening may be individualized based on family history Age 73-75 years, annual mammogram and breast MRI with and without contrast Age >75 years, management should be considered on an individual basis For women with a BRCA2 pathogenic or likely pathogenic variant who are treated for breast cancer and have not had a bilateral mastectomy, screening with annual mammogram and breast MRI should continue as described above. The option of prophylactic bilateral risk-reducing mastectomy (RRM), removal of the breast  tissue before cancer develops, is the best option for significantly decreasing the risk of developing breast cancer. Studies have shown mastectomies reduce the risk of breast cancer by 90-95% in women with a BRCA2 mutation.   Males: Breast self-exam training and education starting at age 47 years Annual clinical breast exam starting at age 74 years  Consider annual mammogram starting at age 23 or 10 years before the earliest known female breast cancer in the family (whichever comes first).   Gynecological Cancer Screening/Risk Reduction: It is recommended that women with a BRCA2 mutation have a risk-reducing salpingo oophorectomy (RRSO), removal of the ovaries and fallopian tubes. It is reasonable to delay RRSO until age 64-45 years unless age at diagnosis in the family warrants earlier age for consideration of RRSO.  Having a RRSO is  estimated to reduce the risk of ovarian cancer by up to 96%. There is still a small risk of developing an "ovarian-like" cancer in the lining of the abdomen, called the peritoneum. Another benefit to having the ovaries removed is the risk reduction for breast cancer. If the ovaries are removed before menopause, the risk of developing breast cancer is reduced. Ovarian cancer screening is an option for women who chose not to have a RRSO or who have not yet completed their family. Current screening methods for ovarian cancer are neither sensitive nor specific, meaning that often early stage ovarian cancer cannot be diagnosed through this screening.  Screening can also be falsely positive with no cancer present. For this reason, RRSO is recommended over screening. If ovarian cancer screening is recommended by a physician, it could include: CA-125 blood tests Transvaginal ultrasounds Clinical pelvic exams   Skin Cancer Screening and Risk Reduction: Regular skin self-examinations Individuals should notify their physicians of any changes to moles such as increasing in size, darkening in color, or other change in appearance. Annual skin examinations by a dermatologist  Follow sun-safety recommendations such as: Using UVA and UVB 30 SPF or higher sunscreen Avoiding sunburns Limiting sun exposure, especially during the hours of 11am-4pm  Wearing protective clothing and sunglasses Avoid using tanning beds For more information about the prevention of melanoma visit [email or web address]   Prostate Cancer Screening: Annual digital rectal exam (DRE) at age 84 Annual PSA blood test at age 54  Pancreatic Cancer Screening/Risk Reduction: Avoid smoking, heavy alcohol use, and obesity. It has been suggested that pancreatic cancer screening be limited to those with a family history of pancreatic cancer (first- or second-degree relative). Ideally, screening should be performed in experienced centers utilizing a  multidisciplinary approach under research conditions. Recommended screening could include annual endoscopic ultrasound (preferred) and/or MRI of the pancreas starting at age 65 or 65 years younger than the earliest age diagnosis in the family.  Additional Considerations: Individuals at risk for developing breast and ovarian cancer may benefit from the use of medication to reduce their risk for cancer. These medications are referred to as chemoprevention. For example, oral contraceptive use has been shown to reduce the risk of ovarian cancer by approximately 60% in BRCA2 mutation carriers if taken for at least 5 years. This risk reduction remains even after discontinuation of oral contraceptives. Recent studies have suggested PARP inhibitors may be a beneficial chemotherapeutic agent for a subset of patients with BRCA2-associated breast, ovarian, prostate, and pancreatic cancers. Clinical trials are currently in process to determine if and how these agents can be useful in the treatment of BRCA2 cancer patients Patients of reproductive age should be made aware of  options for prenatal diagnosis and assisted reproduction including pre-implantation genetic diagnosis. Individuals with a single pathogenic BRCA2 variant are carriers of Fanconi anemia. Fanconi anemia is characterized by developmental delay apparent from infancy, short stature, microcephaly, and coarse dysmorphic features. For there to be a risk of Fanconi anemia in offspring, both the patient and their partner would each have to carry a pathogenic variant in BRCA2. In this case, the risk of having an affected child is 25%.    This information is based on current understanding of the gene and may change in the future.   Implications for Family Members: Hereditary predisposition to cancer due to pathogenic variants in the BRCA2 gene has autosomal dominant inheritance. This means that an individual with a pathogenic variant has a 50% chance of  passing the condition on to his/her offspring. Identification of a pathogenic variant allows for the recognition of at-risk relatives who can pursue testing for the familial variant.  Family members are encouraged to consider genetic testing for this familial pathogenic variant. As there are generally no childhood cancer risks associated with a single pathogenic variant in the BRCA2 gene, individuals in the family are not recommended to have testing until they reach at least 58 years of age. They may contact our office at [phone] for more information or to schedule an appointment. Complimentary testing for the familial variant is available for 90 days after the genetic testing report date.  Family members who live outside of the area are encouraged to find a genetic counselor in their area by visiting: [email or web address].  Resources: FORCE (Facing Our Risk of Cancer Empowered) is a resource for those with a hereditary predisposition to develop cancer.  FORCE provides information about risk reduction, advocacy, legislation, and clinical trials.  Additionally, [employer]CE provides a platform for collaboration and support; which includes: peer navigation, message boards, local support groups, a toll-free helpline, research registry and recruitment, advocate training, published medical research, webinars, brochures, mastectomy photos, and more.  For more information, visit www.facingourrisk.org  PLAN:   1. These results will be made available to her referring provider, Dr. Ellin Saba and the rest of her care team. She would like Dr. Eunice Blase PCP to follow her long-term for this indication and coordinate screening/prophylactic surgeries.    2. Melissa Rodriguez plans to discuss these results with her family and will reach out to [place] if we can be of any assistance in coordinating genetic testing for any of her relatives.   Lacy Duverney, MS, [facility] Genetic  Counselor [email or web address] Phone: [phone]  The patient was seen for a total of 25 minutes in face-to-face genetic counseling.  Dr. Blake Divine was available for discussion regarding this case.   _______________________________________________________________________ For Office Staff:  Number of people involved in session: 1 Was an Intern/ student involved with case: no

## 2022-08-15 ENCOUNTER — Ambulatory Visit (HOSPITAL_BASED_OUTPATIENT_CLINIC_OR_DEPARTMENT_OTHER): Payer: Commercial Managed Care - PPO | Admitting: Anesthesiology

## 2022-08-15 ENCOUNTER — Encounter (HOSPITAL_BASED_OUTPATIENT_CLINIC_OR_DEPARTMENT_OTHER)
Admission: RE | Disposition: A | Discharge status: Home / Self Care | Payer: Self-pay | Source: Ambulatory Visit | Attending: Surgery

## 2022-08-15 ENCOUNTER — Ambulatory Visit (HOSPITAL_BASED_OUTPATIENT_CLINIC_OR_DEPARTMENT_OTHER)
Admission: RE | Admit: 2022-08-15 | Discharge: 2022-08-16 | Disposition: A | Discharge status: Home / Self Care | Payer: Commercial Managed Care - PPO | Source: Ambulatory Visit | Attending: Surgery | Admitting: Surgery

## 2022-08-15 ENCOUNTER — Encounter (HOSPITAL_BASED_OUTPATIENT_CLINIC_OR_DEPARTMENT_OTHER): Payer: Self-pay | Admitting: Surgery

## 2022-08-15 ENCOUNTER — Other Ambulatory Visit: Payer: Self-pay

## 2022-08-15 DIAGNOSIS — Z170 Estrogen receptor positive status [ER+]: Secondary | ICD-10-CM | POA: Insufficient documentation

## 2022-08-15 DIAGNOSIS — C50412 Malignant neoplasm of upper-outer quadrant of left female breast: Secondary | ICD-10-CM

## 2022-08-15 DIAGNOSIS — N6021 Fibroadenosis of right breast: Secondary | ICD-10-CM | POA: Insufficient documentation

## 2022-08-15 DIAGNOSIS — N6081 Other benign mammary dysplasias of right breast: Secondary | ICD-10-CM | POA: Insufficient documentation

## 2022-08-15 HISTORY — DX: Other complications of anesthesia, initial encounter: T88.59XA

## 2022-08-15 HISTORY — PX: MASTECTOMY W/ SENTINEL NODE BIOPSY: SHX2001

## 2022-08-15 HISTORY — PX: SIMPLE MASTECTOMY WITH AXILLARY SENTINEL NODE BIOPSY: SHX6098

## 2022-08-15 SURGERY — MASTECTOMY WITH SENTINEL LYMPH NODE BIOPSY
Anesthesia: Regional | Site: Breast | Laterality: Right

## 2022-08-15 MED ORDER — BUPIVACAINE HCL (PF) 0.25 % IJ SOLN
INTRAMUSCULAR | Status: DC | PRN
  Administered 2022-08-15 (×2): 22 mL via PERINEURAL

## 2022-08-15 MED ORDER — ACETAMINOPHEN 325 MG PO TABS
650.0000 mg | ORAL_TABLET | ORAL | Status: DC | PRN
  Administered 2022-08-15: 650 mg via ORAL
  Filled 2022-08-15: qty 2

## 2022-08-15 MED ORDER — ONDANSETRON HCL 4 MG/2ML IJ SOLN: 4.0000 mg | INTRAMUSCULAR | Status: DC | PRN

## 2022-08-15 MED ORDER — AMISULPRIDE (ANTIEMETIC) 5 MG/2ML IV SOLN: 10.0000 mg | Freq: Once | INTRAVENOUS | Status: DC | PRN

## 2022-08-15 MED ORDER — KETOROLAC TROMETHAMINE 30 MG/ML IJ SOLN: 15.0000 mg | Freq: Once | INTRAMUSCULAR | Status: DC | PRN

## 2022-08-15 MED ORDER — TRANEXAMIC ACID 1000 MG/10ML IV SOLN
Status: DC | PRN
  Administered 2022-08-15: 3000 mg via TOPICAL

## 2022-08-15 MED ORDER — SODIUM CHLORIDE 0.9 % IV SOLN: 250.0000 mL | INTRAVENOUS | Status: DC | PRN

## 2022-08-15 MED ORDER — PROPOFOL 10 MG/ML IV BOLUS
INTRAVENOUS | Status: DC | PRN
  Administered 2022-08-15: 100 mg via INTRAVENOUS

## 2022-08-15 MED ORDER — MIDAZOLAM HCL 2 MG/2ML IJ SOLN
INTRAMUSCULAR | Status: AC
  Filled 2022-08-15: qty 2

## 2022-08-15 MED ORDER — MAGTRACE LYMPHATIC TRACER
INTRAMUSCULAR | Status: DC | PRN
  Administered 2022-08-15: 2 mL via INTRAMUSCULAR

## 2022-08-15 MED ORDER — OXYCODONE HCL 5 MG PO TABS: 5.0000 mg | ORAL_TABLET | ORAL | Status: DC | PRN

## 2022-08-15 MED ORDER — LORAZEPAM 1 MG PO TABS: 1.0000 mg | ORAL_TABLET | Freq: Four times a day (QID) | ORAL | Status: DC | PRN

## 2022-08-15 MED ORDER — IBUPROFEN 800 MG PO TABS: 800.0000 mg | ORAL_TABLET | Freq: Three times a day (TID) | ORAL | 0 refills | Status: DC | PRN

## 2022-08-15 MED ORDER — FENTANYL CITRATE (PF) 100 MCG/2ML IJ SOLN
INTRAMUSCULAR | Status: AC
  Filled 2022-08-15: qty 2

## 2022-08-15 MED ORDER — CLINDAMYCIN PHOSPHATE 900 MG/50ML IV SOLN
900.0000 mg | INTRAVENOUS | Status: AC
  Administered 2022-08-15: 900 mg via INTRAVENOUS

## 2022-08-15 MED ORDER — METOPROLOL TARTRATE 5 MG/5ML IV SOLN
5.0000 mg | Freq: Four times a day (QID) | INTRAVENOUS | Status: DC | PRN
  Administered 2022-08-15: 5 mg via INTRAVENOUS

## 2022-08-15 MED ORDER — CHLORHEXIDINE GLUCONATE CLOTH 2 % EX PADS: 6.0000 | MEDICATED_PAD | Freq: Once | CUTANEOUS | Status: DC

## 2022-08-15 MED ORDER — ACETAMINOPHEN 325 MG PO TABS
650.0000 mg | ORAL_TABLET | Freq: Once | ORAL | Status: AC
  Administered 2022-08-15: 650 mg via ORAL

## 2022-08-15 MED ORDER — SODIUM CHLORIDE 0.9 % IV SOLN
INTRAVENOUS | Status: AC
  Filled 2022-08-15: qty 10

## 2022-08-15 MED ORDER — CLINDAMYCIN PHOSPHATE 900 MG/50ML IV SOLN
INTRAVENOUS | Status: AC
  Filled 2022-08-15: qty 50

## 2022-08-15 MED ORDER — ACETAMINOPHEN 500 MG PO TABS
ORAL_TABLET | ORAL | Status: AC
  Filled 2022-08-15: qty 2

## 2022-08-15 MED ORDER — OXYCODONE HCL 5 MG/5ML PO SOLN: 5.0000 mg | Freq: Once | ORAL | Status: DC | PRN

## 2022-08-15 MED ORDER — ACETAMINOPHEN 325 MG RE SUPP: 650.0000 mg | RECTAL | Status: DC | PRN

## 2022-08-15 MED ORDER — ONDANSETRON HCL 4 MG/2ML IJ SOLN
INTRAMUSCULAR | Status: DC | PRN
  Administered 2022-08-15: 4 mg via INTRAVENOUS

## 2022-08-15 MED ORDER — ACETAMINOPHEN 325 MG PO TABS
ORAL_TABLET | ORAL | Status: AC
  Filled 2022-08-15: qty 2

## 2022-08-15 MED ORDER — ACETAMINOPHEN 500 MG PO TABS: 1000.0000 mg | ORAL_TABLET | Freq: Once | ORAL | Status: DC

## 2022-08-15 MED ORDER — MIDAZOLAM HCL 2 MG/2ML IJ SOLN
2.0000 mg | Freq: Once | INTRAMUSCULAR | Status: AC
  Administered 2022-08-15: 2 mg via INTRAVENOUS

## 2022-08-15 MED ORDER — SODIUM CHLORIDE 0.9% FLUSH: 3.0000 mL | Freq: Two times a day (BID) | INTRAVENOUS | Status: DC

## 2022-08-15 MED ORDER — PROMETHAZINE HCL 25 MG/ML IJ SOLN: 6.2500 mg | INTRAMUSCULAR | Status: DC | PRN

## 2022-08-15 MED ORDER — LACTATED RINGERS IV SOLN: INTRAVENOUS | Status: DC

## 2022-08-15 MED ORDER — FENTANYL CITRATE (PF) 100 MCG/2ML IJ SOLN
100.0000 ug | Freq: Once | INTRAMUSCULAR | Status: AC
  Administered 2022-08-15: 100 ug via INTRAVENOUS

## 2022-08-15 MED ORDER — METOPROLOL TARTRATE 5 MG/5ML IV SOLN
INTRAVENOUS | Status: AC
  Filled 2022-08-15: qty 5

## 2022-08-15 MED ORDER — LIDOCAINE HCL (CARDIAC) PF 100 MG/5ML IV SOSY
PREFILLED_SYRINGE | INTRAVENOUS | Status: DC | PRN
  Administered 2022-08-15: 40 mg via INTRAVENOUS

## 2022-08-15 MED ORDER — FENTANYL CITRATE (PF) 100 MCG/2ML IJ SOLN
INTRAMUSCULAR | Status: DC | PRN
  Administered 2022-08-15: 25 ug via INTRAVENOUS
  Administered 2022-08-15: 50 ug via INTRAVENOUS
  Administered 2022-08-15: 25 ug via INTRAVENOUS

## 2022-08-15 MED ORDER — HYDRALAZINE HCL 10 MG PO TABS
10.0000 mg | ORAL_TABLET | Freq: Four times a day (QID) | ORAL | Status: DC | PRN
  Administered 2022-08-15: 10 mg via ORAL
  Filled 2022-08-15: qty 1

## 2022-08-15 MED ORDER — OXYCODONE HCL 5 MG PO TABS: 5.0000 mg | ORAL_TABLET | Freq: Once | ORAL | Status: DC | PRN

## 2022-08-15 MED ORDER — OXYCODONE HCL 5 MG PO TABS: 5.0000 mg | ORAL_TABLET | Freq: Four times a day (QID) | ORAL | 0 refills | Status: DC | PRN

## 2022-08-15 MED ORDER — MORPHINE SULFATE (PF) 4 MG/ML IV SOLN: 2.0000 mg | INTRAVENOUS | Status: DC | PRN

## 2022-08-15 MED ORDER — FENTANYL CITRATE (PF) 100 MCG/2ML IJ SOLN: 25.0000 ug | INTRAMUSCULAR | Status: DC | PRN

## 2022-08-15 MED ORDER — DEXAMETHASONE SODIUM PHOSPHATE 10 MG/ML IJ SOLN
INTRAMUSCULAR | Status: DC | PRN
  Administered 2022-08-15: 10 mg via INTRAVENOUS

## 2022-08-15 MED ORDER — SODIUM CHLORIDE 0.9% FLUSH: 3.0000 mL | INTRAVENOUS | Status: DC | PRN

## 2022-08-15 MED ORDER — TRANEXAMIC ACID 1000 MG/10ML IV SOLN
INTRAVENOUS | Status: AC
  Filled 2022-08-15: qty 30

## 2022-08-15 SURGICAL SUPPLY — 75 items
ADH SKN CLS APL DERMABOND .7 (GAUZE/BANDAGES/DRESSINGS) ×4
APL PRP STRL LF DISP 70% ISPRP (MISCELLANEOUS) ×2
APL SKNCLS STERI-STRIP NONHPOA (GAUZE/BANDAGES/DRESSINGS)
APPLIER CLIP 11 MED OPEN (CLIP) ×2
APPLIER CLIP 9.375 MED OPEN (MISCELLANEOUS)
APR CLP MED 11 20 MLT OPN (CLIP) ×2
APR CLP MED 9.3 20 MLT OPN (MISCELLANEOUS)
BAG DECANTER FOR FLEXI CONT (MISCELLANEOUS) IMPLANT
BENZOIN TINCTURE PRP APPL 2/3 (GAUZE/BANDAGES/DRESSINGS) IMPLANT
BINDER BREAST LRG (GAUZE/BANDAGES/DRESSINGS) IMPLANT
BINDER BREAST MEDIUM (GAUZE/BANDAGES/DRESSINGS) IMPLANT
BINDER BREAST XLRG (GAUZE/BANDAGES/DRESSINGS) IMPLANT
BINDER BREAST XXLRG (GAUZE/BANDAGES/DRESSINGS) IMPLANT
BIOPATCH RED 1 DISK 7.0 (GAUZE/BANDAGES/DRESSINGS) IMPLANT
BLADE HEX COATED 2.75 (ELECTRODE) ×2 IMPLANT
BLADE SURG 10 STRL SS (BLADE) ×2 IMPLANT
BLADE SURG 15 STRL LF DISP TIS (BLADE) ×2 IMPLANT
BLADE SURG 15 STRL SS (BLADE) ×2
BNDG CMPR 6 X 5 YARDS HK CLSR (GAUZE/BANDAGES/DRESSINGS)
BNDG ELASTIC 6INX 5YD STR LF (GAUZE/BANDAGES/DRESSINGS) ×2 IMPLANT
CANISTER SUCT 1200ML W/VALVE (MISCELLANEOUS) ×2 IMPLANT
CHLORAPREP W/TINT 26 (MISCELLANEOUS) ×2 IMPLANT
CLIP APPLIE 11 MED OPEN (CLIP) IMPLANT
CLIP APPLIE 9.375 MED OPEN (MISCELLANEOUS) ×2 IMPLANT
COVER BACK TABLE 60X90IN (DRAPES) ×2 IMPLANT
COVER MAYO STAND STRL (DRAPES) ×2 IMPLANT
COVER PROBE CYLINDRICAL 5X96 (MISCELLANEOUS) ×2 IMPLANT
DERMABOND ADVANCED .7 DNX12 (GAUZE/BANDAGES/DRESSINGS) ×4 IMPLANT
DRAIN CHANNEL 19F RND (DRAIN) ×2 IMPLANT
DRAPE LAPAROSCOPIC ABDOMINAL (DRAPES) ×2 IMPLANT
DRAPE UTILITY XL STRL (DRAPES) ×2 IMPLANT
DRSG TEGADERM 2-3/8X2-3/4 SM (GAUZE/BANDAGES/DRESSINGS) IMPLANT
DRSG TEGADERM 4X10 (GAUZE/BANDAGES/DRESSINGS) ×2 IMPLANT
ELECT COATED BLADE 2.86 ST (ELECTRODE) ×2 IMPLANT
ELECT REM PT RETURN 9FT ADLT (ELECTROSURGICAL) ×2
ELECTRODE REM PT RTRN 9FT ADLT (ELECTROSURGICAL) ×2 IMPLANT
EVACUATOR SILICONE 100CC (DRAIN) ×2 IMPLANT
GAUZE PAD ABD 8X10 STRL (GAUZE/BANDAGES/DRESSINGS) IMPLANT
GAUZE SPONGE 4X4 12PLY STRL LF (GAUZE/BANDAGES/DRESSINGS) IMPLANT
GLOVE BIOGEL PI IND STRL 8 (GLOVE) ×2 IMPLANT
GLOVE ECLIPSE 8.0 STRL XLNG CF (GLOVE) ×2 IMPLANT
GOWN STRL REUS W/ TWL LRG LVL3 (GOWN DISPOSABLE) ×4 IMPLANT
GOWN STRL REUS W/ TWL XL LVL3 (GOWN DISPOSABLE) ×2 IMPLANT
GOWN STRL REUS W/TWL LRG LVL3 (GOWN DISPOSABLE) ×4
GOWN STRL REUS W/TWL XL LVL3 (GOWN DISPOSABLE) ×2
LIGHT WAVEGUIDE WIDE FLAT (MISCELLANEOUS) IMPLANT
NDL HYPO 25X1 1.5 SAFETY (NEEDLE) ×4 IMPLANT
NDL SAFETY ECLIP 18X1.5 (MISCELLANEOUS) ×2 IMPLANT
NEEDLE HYPO 25X1 1.5 SAFETY (NEEDLE) ×4
NS IRRIG 1000ML POUR BTL (IV SOLUTION) IMPLANT
PACK BASIN DAY SURGERY FS (CUSTOM PROCEDURE TRAY) ×2 IMPLANT
PENCIL SMOKE EVACUATOR (MISCELLANEOUS) ×2 IMPLANT
PIN SAFETY STERILE (MISCELLANEOUS) ×2 IMPLANT
SLEEVE SCD COMPRESS KNEE MED (STOCKING) ×2 IMPLANT
SPIKE FLUID TRANSFER (MISCELLANEOUS) IMPLANT
SPONGE T-LAP 18X18 ~~LOC~~+RFID (SPONGE) ×2 IMPLANT
SPONGE T-LAP 4X18 ~~LOC~~+RFID (SPONGE) IMPLANT
STAPLER VISISTAT 35W (STAPLE) ×2 IMPLANT
STRIP CLOSURE SKIN 1/2X4 (GAUZE/BANDAGES/DRESSINGS) IMPLANT
SUT CHROMIC 3 0 SH 27 (SUTURE) IMPLANT
SUT ETHILON 2 0 FS 18 (SUTURE) ×2 IMPLANT
SUT MNCRL AB 3-0 PS2 18 (SUTURE) ×2 IMPLANT
SUT MNCRL AB 4-0 PS2 18 (SUTURE) IMPLANT
SUT MON AB 4-0 PC3 18 (SUTURE) IMPLANT
SUT SILK 2 0 PERMA HAND 18 BK (SUTURE) ×2 IMPLANT
SUT SILK 2 0 SH (SUTURE) IMPLANT
SUT VIC AB 3-0 54X BRD REEL (SUTURE) ×2 IMPLANT
SUT VIC AB 3-0 BRD 54 (SUTURE) ×2
SUT VICRYL 3-0 CR8 SH (SUTURE) ×4 IMPLANT
SYR CONTROL 10ML LL (SYRINGE) ×4 IMPLANT
TOWEL GREEN STERILE FF (TOWEL DISPOSABLE) ×4 IMPLANT
TRACER MAGTRACE VIAL (MISCELLANEOUS) IMPLANT
TRAY FAXITRON CT DISP (TRAY / TRAY PROCEDURE) ×2 IMPLANT
TUBE CONNECTING 20X1/4 (TUBING) ×2 IMPLANT
YANKAUER SUCT BULB TIP NO VENT (SUCTIONS) ×2 IMPLANT

## 2022-08-15 NOTE — Anesthesia Preprocedure Evaluation (Addendum)
Anesthesia Evaluation  Patient identified by MRN, date of birth, ID band Patient awake    Reviewed: Allergy & Precautions, NPO status , Patient's Chart, lab work & pertinent test results  Airway Mallampati: II  TM Distance: >3 FB Neck ROM: Full    Dental  (+) Missing   Pulmonary neg pulmonary ROS   Pulmonary exam normal        Cardiovascular negative cardio ROS Normal cardiovascular exam     Neuro/Psych negative neurological ROS  negative psych ROS   GI/Hepatic negative GI ROS, Neg liver ROS,,,  Endo/Other  negative endocrine ROS    Renal/GU negative Renal ROS     Musculoskeletal negative musculoskeletal ROS (+)    Abdominal   Peds  Hematology negative hematology ROS (+)   Anesthesia Other Findings LEFT BREAST CANCER  Reproductive/Obstetrics                             Anesthesia Physical Anesthesia Plan  ASA: 2  Anesthesia Plan: General and Regional   Post-op Pain Management: Regional block*   Induction: Intravenous  PONV Risk Score and Plan: 3 and Ondansetron, Dexamethasone, Midazolam and Treatment may vary due to age or medical condition  Airway Management Planned: LMA  Additional Equipment:   Intra-op Plan:   Post-operative Plan: Extubation in OR  Informed Consent: I have reviewed the patients History and Physical, chart, labs and discussed the procedure including the risks, benefits and alternatives for the proposed anesthesia with the patient or authorized representative who has indicated his/her understanding and acceptance.     Dental advisory given  Plan Discussed with: CRNA  Anesthesia Plan Comments:        Anesthesia Quick Evaluation

## 2022-08-15 NOTE — Anesthesia Postprocedure Evaluation (Signed)
Anesthesia Post Note  Patient: Melissa Rodriguez  Procedure(s) Performed: LEFT SIMPLE MASTECTOMY, LEFT SENTINEL LYMPH NODE MAPPING (Left: Breast) RIGHT RISK REDUCING MASTECTOMY (Right: Breast)     Patient location during evaluation: PACU Anesthesia Type: Regional and General Level of consciousness: awake and alert Pain management: pain level controlled Vital Signs Assessment: post-procedure vital signs reviewed and stable Respiratory status: spontaneous breathing, nonlabored ventilation and respiratory function stable Cardiovascular status: blood pressure returned to baseline and stable Postop Assessment: no apparent nausea or vomiting Anesthetic complications: no   No notable events documented.  Last Vitals:  Vitals:   08/15/22 1458 08/15/22 1524  BP: (!) 162/71 (!) 161/80  Pulse: 77 80  Resp: 16 18  Temp:  (!) 36.3 C  SpO2: 97% 97%    Last Pain:  Vitals:   08/15/22 1524  TempSrc:   PainSc: 1                  Lowella Curb

## 2022-08-15 NOTE — Anesthesia Procedure Notes (Signed)
Anesthesia Regional Block: Pectoralis block   Pre-Anesthetic Checklist: , timeout performed,  Correct Patient, Correct Site, Correct Laterality,  Correct Procedure, Correct Position, site marked,  Risks and benefits discussed,  Surgical consent,  Pre-op evaluation,  At surgeon's request and post-op pain management  Laterality: Left  Prep: chloraprep       Needles:  Injection technique: Single-shot  Needle Type: Echogenic Stimulator Needle     Needle Length: 9cm  Needle Gauge: 21     Additional Needles:   Procedures:,,,, ultrasound used (permanent image in chart),,    Narrative:  Start time: 08/15/2022 11:00 AM End time: 08/15/2022 11:10 AM Injection made incrementally with aspirations every 5 mL.  Performed by: Personally  Anesthesiologist: Leonides Grills, MD  Additional Notes: Functioning IV was confirmed and monitors were applied.  A timeout was performed. Sterile prep, hand hygiene and sterile gloves were used. A 90mm 21ga Arrow echogenic stimulator needle was used. Negative aspiration and negative test dose prior to incremental administration of local anesthetic. The patient tolerated the procedure well.  Ultrasound guidance: relevent anatomy identified, needle position confirmed, local anesthetic spread visualized around nerve(s), vascular puncture avoided.  Image printed for medical record.

## 2022-08-15 NOTE — Discharge Instructions (Addendum)
[facility] [place] [phone]  MASTECTOMY: POST OP INSTRUCTIONS  Always review your discharge instruction sheet given to you by the facility where your surgery was performed. IF YOU HAVE DISABILITY OR FAMILY LEAVE FORMS, YOU MUST BRING THEM TO THE OFFICE FOR PROCESSING.   DO NOT GIVE THEM TO YOUR DOCTOR. A prescription for pain medication may be given to you upon discharge.  Take your pain medication as prescribed, if needed.  If narcotic pain medicine is not needed, then you may take acetaminophen (Tylenol) or ibuprofen (Advil) as needed. Take your usually prescribed medications unless otherwise directed. If you need a refill on your pain medication, please contact your pharmacy.  They will contact our office to request authorization.  Prescriptions will not be filled after 5pm or on week-ends. You should follow a light diet the first few days after arrival home, such as soup and crackers, etc.  Resume your normal diet the day after surgery. Most patients will experience some swelling and bruising on the chest and underarm.  Ice packs will help.  Swelling and bruising can take several days to resolve.  It is common to experience some constipation if taking pain medication after surgery.  Increasing fluid intake and taking a stool softener (such as Colace) will usually help or prevent this problem from occurring.  A mild laxative (Milk of Magnesia or Miralax) should be taken according to package instructions if there are no bowel movements after 48 hours. Unless discharge instructions indicate otherwise, leave your bandage dry and in place until your next appointment in 3-5 days.  You may take a limited sponge bath.  No tube baths or showers until the drains are removed.  You may have steri-strips (small skin tapes) in place directly over the incision.  These strips should be left on the skin for 7-10 days.  If your surgeon used skin glue on the incision, you may shower in 24 hours.   The glue will flake off over the next 2-3 weeks.  Any sutures or staples will be removed at the office during your follow-up visit. DRAINS:  If you have drains in place, it is important to keep a list of the amount of drainage produced each day in your drains.  Before leaving the hospital, you should be instructed on drain care.  Call our office if you have any questions about your drains. ACTIVITIES:  You may resume regular (light) daily activities beginning the next day--such as daily self-care, walking, climbing stairs--gradually increasing activities as tolerated.  You may have sexual intercourse when it is comfortable.  Refrain from any heavy lifting or straining until approved by your doctor. You may drive when you are no longer taking prescription pain medication, you can comfortably wear a seatbelt, and you can safely maneuver your car and apply brakes. RETURN TO WORK:  __________________________________________________________ Melissa Rodriguez should see your doctor in the office for a follow-up appointment approximately 3-5 days after your surgery.  Your doctor's nurse will typically make your follow-up appointment when she calls you with your pathology report.  Expect your pathology report 2-3 business days after your surgery.  You may call to check if you do not hear from [place] after three days.   OTHER INSTRUCTIONS: ______________________________________________________________________________________________ ____________________________________________________________________________________________ WHEN TO CALL YOUR DOCTOR: Fever over 101.0 Nausea and/or vomiting Extreme swelling or bruising Continued bleeding from incision. Increased pain, redness, or drainage from the incision. The clinic staff is available to answer your questions during regular business hours.  Please don't hesitate  to call and ask to speak to one of the nurses for clinical concerns.  If you have a medical emergency, go to the  nearest emergency room or call 911.  A surgeon from [facility] is always on call at the hospital. [address], [place], [place]  [address] ? [address] [address], [place], [place]   [address] [phone] ? [phone] ? FAX [phone] Web site: [email or web address]     Post Anesthesia Home Care Instructions  Activity: Get plenty of rest for the remainder of the day. A responsible individual must stay with you for 24 hours following the procedure.  For the next 24 hours, DO NOT: -Drive a car -Advertising copywriter -Drink alcoholic beverages -Take any medication unless instructed by your physician -Make any legal decisions or sign important papers.  Meals: Start with liquid foods such as gelatin or soup. Progress to regular foods as tolerated. Avoid greasy, spicy, heavy foods. If nausea and/or vomiting occur, drink only clear liquids until the nausea and/or vomiting subsides. Call your physician if vomiting continues.  Special Instructions/Symptoms: Your throat may feel dry or sore from the anesthesia or the breathing tube placed in your throat during surgery. If this causes discomfort, gargle with warm salt water. The discomfort should disappear within 24 hours.  If you had a scopolamine patch placed behind your ear for the management of post- operative nausea and/or vomiting:  1. The medication in the patch is effective for 72 hours, after which it should be removed.  Wrap patch in a tissue and discard in the trash. Wash hands thoroughly with soap and water. 2. You may remove the patch earlier than 72 hours if you experience unpleasant side effects which may include dry mouth, dizziness or visual disturbances. 3. Avoid touching the patch. Wash your hands with soap and water after contact with the patch.     Regional Anesthesia Blocks  1. Numbness or the inability to move the "blocked" extremity may last from 3-48 hours after placement. The length of time depends on the  medication injected and your individual response to the medication. If the numbness is not going away after 48 hours, call your surgeon.  2. The extremity that is blocked will need to be protected until the numbness is gone and the  Strength has returned. Because you cannot feel it, you will need to take extra care to avoid injury. Because it may be weak, you may have difficulty moving it or using it. You may not know what position it is in without looking at it while the block is in effect.  3. For blocks in the legs and feet, returning to weight bearing and walking needs to be done carefully. You will need to wait until the numbness is entirely gone and the strength has returned. You should be able to move your leg and foot normally before you try and bear weight or walk. You will need someone to be with you when you first try to ensure you do not fall and possibly risk injury.  4. Bruising and tenderness at the needle site are common side effects and will resolve in a few days.  5. Persistent numbness or new problems with movement should be communicated to the surgeon or the [facility] [phone] [facility] [phone]).Regional Anesthesia Blocks  1. Numbness or the inability to move the "blocked" extremity may last from 3-48 hours after placement. The length of time depends on the medication injected  and your individual response to the medication. If the numbness is not going away after 48 hours, call your surgeon.  2. The extremity that is blocked will need to be protected until the numbness is gone and the  Strength has returned. Because you cannot feel it, you will need to take extra care to avoid injury. Because it may be weak, you may have difficulty moving it or using it. You may not know what position it is in without looking at it while the block is in effect.  3. For blocks in the legs and feet, returning to weight bearing and walking needs to be done  carefully. You will need to wait until the numbness is entirely gone and the strength has returned. You should be able to move your leg and foot normally before you try and bear weight or walk. You will need someone to be with you when you first try to ensure you do not fall and possibly risk injury.  4. Bruising and tenderness at the needle site are common side effects and will resolve in a few days.  5. Persistent numbness or new problems with movement should be communicated to the surgeon or the [facility] [phone] [facility] [phone]).  About my Jackson-Pratt Bulb Drain  What is a Jackson-Pratt bulb? A Jackson-Pratt is a soft, round device used to collect drainage. It is connected to a long, thin drainage catheter, which is held in place by one or two small stiches near your surgical incision site. When the bulb is squeezed, it forms a vacuum, forcing the drainage to empty into the bulb.  Emptying the Jackson-Pratt bulb- To empty the bulb: 1. Release the plug on the top of the bulb. 2. Pour the bulb's contents into a measuring container which your nurse will provide. 3. Record the time emptied and amount of drainage. Empty the drain(s) as often as your     doctor or nurse recommends.  Date                  Time                    Amount (Drain 1)                 Amount (Drain  2)  _____________________________________________________________________  _____________________________________________________________________  _____________________________________________________________________  _____________________________________________________________________  _____________________________________________________________________  _____________________________________________________________________  _____________________________________________________________________  _____________________________________________________________________  Squeezing the Jackson-Pratt Bulb- To squeeze the bulb: 1. Make sure the plug at the top of the bulb is open. 2. Squeeze the bulb tightly in your fist. You will hear air squeezing from the bulb. 3. Replace the plug while the bulb is squeezed. 4. Use a safety pin to attach the bulb to your clothing. This will keep the catheter from     pulling at the bulb insertion site.  When to call your doctor- Call your doctor if: Drain site becomes red, swollen or hot. You have a fever greater than 101 degrees F. There is oozing at the drain site. Drain falls out (apply a guaze bandage over the drain hole and secure it with tape). Drainage increases daily not related to activity patterns. (You will usually have more drainage when you are active than when you are resting.) Drainage has a bad odor.

## 2022-08-15 NOTE — Anesthesia Procedure Notes (Signed)
Procedure Name: LMA Insertion Date/Time: 08/15/2022 12:48 PM  Performed by: Thornell Mule, CRNAPre-anesthesia Checklist: Patient identified, Emergency Drugs available, Suction available and Patient being monitored Patient Re-evaluated:Patient Re-evaluated prior to induction Oxygen Delivery Method: Circle system utilized Preoxygenation: Pre-oxygenation with 100% oxygen Induction Type: IV induction Ventilation: Mask ventilation without difficulty LMA: LMA inserted LMA Size: 3.0 Number of attempts: 1 Placement Confirmation: positive ETCO2 Tube secured with: Tape Dental Injury: Teeth and Oropharynx as per pre-operative assessment

## 2022-08-15 NOTE — Transfer of Care (Signed)
Immediate Anesthesia Transfer of Care Note  Patient: Melissa Rodriguez  Procedure(s) Performed: LEFT SIMPLE MASTECTOMY, LEFT SENTINEL LYMPH NODE MAPPING (Left: Breast) RIGHT RISK REDUCING MASTECTOMY (Right: Breast)  Patient Location: PACU  Anesthesia Type:GA combined with regional for post-op pain  Level of Consciousness: drowsy and patient cooperative  Airway & Oxygen Therapy: Patient Spontanous Breathing and Patient connected to face mask oxygen  Post-op Assessment: Report given to RN and Post -op Vital signs reviewed and stable  Post vital signs: Reviewed and stable  Last Vitals:  Vitals Value Taken Time  BP    Temp    Pulse    Resp    SpO2      Last Pain:  Vitals:   08/15/22 1035  TempSrc: Temporal  PainSc: 0-No pain      Patients Stated Pain Goal: 3 (08/15/22 1035)  Complications: No notable events documented.

## 2022-08-15 NOTE — Op Note (Addendum)
Preoperative diagnosis: Stage I left breast cancer upper outer quadrant history of breast cancer in the past  Postoperative diagnosis: Same  Procedure: Left breast simple mastectomy with left axillary sentinel lymph node mapping, right risk reducing mastectomy simple  Surgeon: Harriette Bouillon, MD  Anesthesia: General with 0.25% Marcaine local and pectoral block bilaterally  Drains: 19 round to each side  EBL: 10 cc  IV fluids: Per anesthesia record  Indications for procedure: The patient is a 57 year old female has developed left breast cancer.  She is history of previous breast cancer status post breast conserving surgery.  She opted to have bilateral mastectomies since she is recurred in her left side and wishes to reduce her risk on the right side.  Discussed pros and cons of mastectomy with consult in the office as well as complications of bleeding, infection, hematoma, flap necrosis, lymphedema, shoulder stiffness, the need for the treatment center procedures.  She did not desire reconstruction this point in time. The surgical and non surgical options have been discussed with the patient.  Risks of surgery include bleeding,  Infection,  Flap necrosis,  Tissue loss,  Chronic pain, death, Numbness,  And the need for additional procedures.  Reconstruction options also have been discussed with the patient as well.  The patient agrees to proceed.       Description of procedure: The patient was met in the holding area and questions were answered.  She was taken back to the operative room after having bilateral pectoral blocks placed by anesthesia and placed supine upon the operating room table.  After induction of general anesthesia, 2 cc of MAC tracer injected in the left breast for lymph node mapping purposes.  Massage was done.  Both breasts were then prepped draped sterile fashion and a second timeout performed.  Right side was done first.  Elliptical incisions were made above and below the  nipple areolar complex.  A superior skin flap was taken the clavicle, medial skin flap taken to the sternum, inferior skin flap taken to the inframammary fold and lateral skin flap taken beyond the lateral attachments.  The breast was then dissected off the pectoralis major muscle to include the fascia in a medial lateral fashion until lateral attachments were identified and divided.  The breast was oriented with sutures and sent to pathology.  A TXA soaked sponge was placed.  A similar fashion, a curvilinear incision was made in the left side.  Superior and inferior skin flaps were raised in similar fashion as well as a medial lateral skin flap.  Tumor was quite lateral in the outer quadrant.  I could palpate this.  The mag trace probe was used and this was the hotspot of uptake in the breast.  This corresponded to cancer.  Of note she had previous lymph nodes removed from left side of previous lumpectomy.  The probe did not pick up any additional signal except for the 1 large node in the axillary tail left breast.  The breast in this area were removed together.  Palpation of the axilla revealed no other abnormality.  There is significant scarring from previous lymph node mapping from previous breast cancer surgery.  Hemostasis achieved with cautery and TXA soaked sponge.  Both sponge was removed.  Hemostasis was excellent.  Through a separate stab incision 19 round drain was placed on both sides.  Deep tissue planes were then approximated with 3-0 Vicryl.  4 Monocryl was used to close skin in a subcuticular fashion.  Dermabond was  applied.  All counts were found to be correct.  Patient was awoke extubated taken to recovery in satisfactory condition.

## 2022-08-15 NOTE — Progress Notes (Signed)
Assisted Dr. Ellender with left, right, pectoralis, ultrasound guided block. Side rails up, monitors on throughout procedure. See vital signs in flow sheet. Tolerated Procedure well. ?

## 2022-08-15 NOTE — H&P (Signed)
History of Present Illness: Melissa Rodriguez is a 57 y.o. female who is seen today as an office consultation for evaluation of NEW BREAST CANCER  Pt presents for evaluation new left breast cancer ER/PR POS HER 2 NEU POS left UOQ.  Hx of left breast cancer 2008 treated with breast conserving surgery , radiation and tamoxifen. Noticed new lump left breast.   Per oncology consultation   Stage I (T1 cN0 G3 ER/PR/HER2+) left breast IDC UOQ: - She felt a lump in her left breast and was evaluated by Dr. Gerda Diss. - Diagnostic mammogram (07/16/2022): Irregular hypoechoic mass in the left breast at 1:00 7 cm from nipple measuring 1.9 x 0.9 x 1.4 cm. Suspicious 1.1 cm group of calcifications in the lower outer right breast. - US biopsy of the left breast 1 o'clock mass (07/16/2022): Grade 3 IDC, ER 99% positive, moderate to strong staining, PR 85% positive, moderate to strong staining, Ki-67 35%, HER2 IHC 2+, HER2 FISH positive. - Right breast lower outer quadrant stereotactic biopsy (07/26/2022): Benign  2. Left breast UIQ IDC: - Diagnosed in 2008, s/p lumpectomy (03/13/2006): 7 mm well-differentiated IDC, ER 79% positive, PR 92% positive, HER2 negative. SLNB x 4 negative. - Declined antiestrogen therapy. - Completed XRT to left breast (04/2006). Review of Systems: A complete review of systems was obtained from the patient. I have reviewed this information and discussed as appropriate with the patient. See HPI as well for other ROS.    Medical History: Past Medical History:  Diagnosis Date  Anemia  GERD (gastroesophageal reflux disease)  History of cancer   There is no problem list on file for this patient.  Past Surgical History:  Procedure Laterality Date  MASTECTOMY PARTIAL / LUMPECTOMY 2008    Allergies  Allergen Reactions  Diphenhydramine Hcl Hallucination and Anxiety  Jittery and Hallucinations  Made her feel like she was crawling the walls.  Hydrocodone Shortness Of Breath  Felt like she  could not breathe-February 2020  Ciprofloxacin Hives  Penicillins Hives and Rash  Any medicine related to PCN  Did it involve swelling of the face/tongue/throat, SOB, or low BP? No Did it involve sudden or severe rash/hives, skin peeling, or any reaction on the inside of your mouth or nose? No Did you need to seek medical attention at a hospital or doctor's office? No When did it last happen?  If all above answers are "NO", may proceed with cephalosporin use.   Current Outpatient Medications on File Prior to Visit  Medication Sig Dispense Refill  calcium carbonate (TUMS) 200 mg calcium (500 mg) chewable tablet Take 1 tablet by mouth once daily  cholecalciferol (VITAMIN D3) 1000 unit capsule Take 1,000 Units by mouth once daily  grape seed xt/bioflavon,citrus (GRAPE SEED XT-BIOFLAV,CITRUS) 50-250 mg Cap Take by mouth   No current facility-administered medications on file prior to visit.   Family History  Problem Relation Age of Onset  High blood pressure (Hypertension) Mother  Hyperlipidemia (Elevated cholesterol) Mother  High blood pressure (Hypertension) Father  Hyperlipidemia (Elevated cholesterol) Father  Coronary Artery Disease (Blocked arteries around heart) Father    Social History   Tobacco Use  Smoking Status Never  Smokeless Tobacco Never    Social History   Socioeconomic History  Marital status: Married  Tobacco Use  Smoking status: Never  Smokeless tobacco: Never  Substance and Sexual Activity  Alcohol use: Never  Drug use: Never   Objective:   Vitals:  08/05/22 1407 08/05/22 1412  BP: (!) 142/81  Pulse:  89  Temp: 36.8 C (98.3 F)  SpO2: 99%  Weight: (!) 44.1 kg (97 lb 3.2 oz)  Height: 160 cm (5\' 3" )  PainSc: 1  PainLoc: Chest   Body mass index is 17.22 kg/m.  Physical Exam HENT:  Head: Normocephalic.  Eyes:  Pupils: Pupils are equal, round, and reactive to light.  Cardiovascular:  Rate and Rhythm: Normal rate.  Pulmonary:  Effort:  Pulmonary effort is normal.  Chest:  Breasts: Right: Swelling present.  Left: Mass present. No nipple discharge.   Musculoskeletal:  Cervical back: Normal range of motion.  Lymphadenopathy:  Upper Body:  Right upper body: No supraclavicular or axillary adenopathy.  Left upper body: No supraclavicular or axillary adenopathy.  Skin: General: Skin is warm.  Neurological:  General: No focal deficit present.  Mental Status: She is alert.  Psychiatric:  Mood and Affect: Mood normal.     Labs, Imaging and Diagnostic Testing:  ADDENDUM REPORT: 07/16/2022 09:03  ADDENDUM: An addendum is made to the findings section of this exam due to an omission.  This should include:  NO lymphadenopathy seen in the left axilla.   Electronically Signed By: Edwin Cap M.D. On: 07/16/2022 09:03  Addended by Edwin Cap, MD on 07/16/2022 11:03 AM   Study Result  Narrative & Impression  CLINICAL DATA: 57 year old female with a palpable area of concern in the left breast. History of left breast cancer post lumpectomy 04/15/2006.  EXAM: DIGITAL DIAGNOSTIC BILATERAL MAMMOGRAM WITH TOMOSYNTHESIS AND CAD; ULTRASOUND LEFT BREAST LIMITED  TECHNIQUE: Bilateral digital diagnostic mammography and breast tomosynthesis was performed. The images were evaluated with computer-aided detection. ; Targeted ultrasound examination of the left breast was performed.  COMPARISON: Previous exam(s).  ACR Breast Density Category c: The breasts are heterogeneously dense, which may obscure small masses.  FINDINGS: There is an irregular mass containing calcifications at the site of palpable concern in the far upper outer posterior left breast. This could not be fully obtained within the field of view but measures approximally 2.4 cm. A stable lymph node is seen in the superior left breast on the MLO view, unchanged dating back to prior mammograms from 2010. There are linear oriented mixed round  and punctate calcifications in the lower slightly outer right breast spanning 1.1 cm.  Physical examination of the left breast reveals a firm mass in the upper slightly outer left breast.  Targeted ultrasound of the left breast was performed. There is an irregular hypoechoic mass in the left breast at 1 o'clock 7 cm from nipple measuring 1.9 x 0.9 x 1 4 cm. This corresponds well with the mass seen in the upper slightly outer left breast at mammography.  IMPRESSION: 1. Highly suspicious palpable 1.9 cm mass in the left breast at the 1 o'clock position.  2. Suspicious 1.1 cm group of calcifications in the lower slightly outer right breast.  RECOMMENDATION: 1. Recommend ultrasound-guided core biopsy of the 1.9 cm mass in the left breast at the 1 o'clock position. This will subsequently be performed and dictated separately.  2. Recommend stereotactic guided biopsy of the 1.1 cm group of calcifications in the lower slightly outer right breast.  I have discussed the findings and recommendations with the patient. If applicable, a reminder letter will be sent to the patient regarding the next appointment.  BI-RADS CATEGORY 5: Highly suggestive of malignancy.  Electronically Signed: By: Edwin Cap M.D. On: 07/16/2022 08:56   FINAL MICROSCOPIC DIAGNOSIS:  A. BREAST, LEFT 1 OC, BIOPSY: - Invasive ductal carcinoma, see  note - Tubule formation: Score 3 - Nuclear pleomorphism: Score 3 - Mitotic count: Score 2 - Total score: 8 - Overall Grade: 3 - Lymphovascular invasion: Not identified - Cancer Length: 0.9 cm - Calcifications: Not identified  Note: Dr. Kenard Gower reviewed the case and concurs with the interpretation. A breast prognostic profile (ER, PR, Ki-67 and HER2) is pending and will be reported in an addendum.  GROSS DESCRIPTION:  The specimen is received in formalin labeled with the patient's name and left breast, and consists of 3 cores of tan-yellow  fibroadipose tissue, ranging from 1.8 x 0.2 x 0.2 cm to 2.1 x 0.2 x 0.2 cm. Specimen is entirely submitted in 1 cassette. Time in formalin 9:15 AM on 07/16/2022. Cold ischemic time less than 1 minute. Lovey Newcomer 07/16/2022)  Final Diagnosis performed by Clifton James, MD. Electronically signed 07/17/2022 Technical component performed at [facility], [address], [place], [place] [address]. Professional component performed at [facility], [address], [place], [place] [address]. Immunohistochemistry Technical component (if applicable) was performed at [facility]. [address], [place], [place] [address]. IMMUNOHISTOCHEMISTRY DISCLAIMER (if applicable): Some of these immunohistochemical stains may have been developed and the performance characteristics determine by [facility]. Some may not have been cleared or approved by the U.S. Food and Drug Administration. The FDA has determined that such clearance or approval is not necessary. This test is used for clinical purposes. It should not be regarded as investigational or for research. This laboratory is certified under the Clinical Laboratory Improvement Amendments of 1988 (CLIA-88) as qualified to perform high complexity clinical laboratory testing. The controls stained appropriately. IHC stains are performed on formalin fixed, paraffin embedded tissue using a 3,3"diaminobenzidine (DAB) chromogen and Leica Bond Autostainer System. The staining intensity of the nucleus is score manually and is reported as the percentage of tumor cell nuclei demonstrating specific nuclear staining. The specimens are fixed in 10% Neutral Formalin for at least 6 hours and up to 72hrs. These tests are validated on decalcified tissue. Results should be interpreted with caution given the possibility of false negative results on decalcified specimens. Antibody Clones are as follows  ER-clone 29F, PR-clone 16, Ki67- clone MM1. Some of these immunohistochemical stains may have been developed and the performance characteristics determined by [facility].  ADDENDUM: PROGNOSTIC INDICATOR RESULTS:  Immunohistochemical and morphometric analysis performed manually.  The tumor cells are EQUIVOCAL for Her2 (2+). Her2 by FISH will be performed and the results will be reported separately.  Estrogen Receptor: POSITIVE, 99%, MODERATE TO STRONG STAINING Progesterone Receptor: POSITIVE, 85%, MODERATE TO STRONG STAINING Proliferation Marker Ki-67: 35%  Reference Range Estrogen and Progesterone Receptor Negative 0% Positive >1%  All controls stained appropriately.  Addendum #1 performed by Jerene Bears, MD. Electronically signed 07/19/2022 Technical component performed at [facility], [address], [place], [place] [address]. Professional component performed at [employer]. [facility], [address] [place], [place] [address]. Immunohistochemistry Technical component (if applicable) was performed at [facility]. [address], [place], [place] [address]. IMMUNOHISTOCHEMISTRY DISCLAIMER (if applicable): Some of these immunohistochemical stains may have been developed and the performance characteristics determine by [facility]. Some may not have been cleared or approved by the U.S. Food and Drug Administration. The FDA has determined that such clearance or approval is not necessary. This test is used for clinical purposes. It should not be regarded as investigational or for research. This  laboratory is certified under the Clinical Laboratory Improvement Amendments of 1988 (CLIA-88) as qualified to perform high complexity clinical laboratory testing. The controls stained appropriately. IHC stains are performed on formalin fixed, paraffin embedded tissue using a 3,3"diaminobenzidine (DAB)  chromogen and Leica Bond Autostainer System. The staining intensity of the nucleus is score manually and is reported as the percentage of tumor cell nuclei demonstrating specific nuclear staining. The specimens are fixed in 10% Neutral Formalin for at least 6 hours and up to 72hrs. These tests are validated on decalcified tissue. Results should be interpreted with caution given the possibility of false negative results on decalcified specimens. Antibody Clones are as follows ER-clone 94F, PR-clone 16, Ki67- clone MM1. Some of these immunohistochemical stains may have been developed and the performance characteristics determined by [facility].  ADDENDUM:  FLOURESCENCE IN-SITU HYBRIDIZATION RESULTS:  GROUP 1: HER2 **POSITIVE**  On the tissue sample received from this individual HER2 FISH was performed by a technologist and cell imaging and analysis on the BioView.  RATIO of HER2/CEN 17 SIGNALS: 2.54 AVERAGE HER2 COPY NUMBER PER CELL: 4.45  The ratio of HER2/CEN 17 result exceeds the cutoff value of >=2.0 and a copy number of HER2 signals exceeding the cutoff range of >=4.0 signals per cell. Arch Pathol Lab Med 1:1, 2018.  Diagnosis Breast, right, needle core biopsy, lower outer calcifications BENIGN BREAST WITH FIBROCYSTIC CHANGES INCLUDING STROMAL FIBROSIS, CYSTIC DILATATION OF DUCTS, APOCRINE METAPLASIA, ADENOSIS AND USUAL DUCT HYPERPLASIA MICROCALCIFICATIONS PRESENT WITHIN CYSTIC APOCRINE METAPLASIA AND ADENOSIS NEGATIVE FOR ATYPIA AND CARCINOMA Diagnosis Note Diagnosis called to Orlie Pollen at [facility] by Dr. Venetia Night on 07/29/2022 at 9:41 AM. Jerene Bears MD Pathologist, Electronic Signature    Assessment and Plan:   Diagnoses and all orders for this visit:  Breast cancer, stage 1, estrogen receptor positive, left (CMS/HHS-HCC)  History of breast cancer   Pt desires bilateral mastectomies due to elevated risk and recurrent  breast cancer   Will attempt SLN mapping on the left   Risk of bleeding , infection, flap loss numbness, cosmetic issues ,DVT and worsening of underlying medical problems.   Hayden Rasmussen, MD

## 2022-08-15 NOTE — Progress Notes (Signed)
Pt blood pressure 162/70, floorstock pyxis does not carry po hydralazine for prn order. Messaged pharmacy to obtain medication.

## 2022-08-15 NOTE — Anesthesia Procedure Notes (Signed)
Anesthesia Regional Block: Pectoralis block   Pre-Anesthetic Checklist: , timeout performed,  Correct Patient, Correct Site, Correct Laterality,  Correct Procedure, Correct Position, site marked,  Risks and benefits discussed,  Surgical consent,  Pre-op evaluation,  At surgeon's request and post-op pain management  Laterality: Right  Prep: chloraprep       Needles:  Injection technique: Single-shot  Needle Type: Echogenic Stimulator Needle     Needle Length: 9cm  Needle Gauge: 21     Additional Needles:   Procedures:,,,, ultrasound used (permanent image in chart),,    Narrative:  Start time: 08/15/2022 11:10 AM End time: 08/15/2022 11:20 AM Injection made incrementally with aspirations every 5 mL.  Performed by: Personally  Anesthesiologist: Leonides Grills, MD  Additional Notes: Functioning IV was confirmed and monitors were applied.  A timeout was performed. Sterile prep, hand hygiene and sterile gloves were used. A 90mm 21ga Arrow echogenic stimulator needle was used. Negative aspiration and negative test dose prior to incremental administration of local anesthetic. The patient tolerated the procedure well.  Ultrasound guidance: relevent anatomy identified, needle position confirmed, local anesthetic spread visualized around nerve(s), vascular puncture avoided.  Image printed for medical record.

## 2022-08-15 NOTE — Interval H&P Note (Signed)
History and Physical Interval Note:  08/15/2022 11:32 AM  Melissa Rodriguez  has presented today for surgery, with the diagnosis of LEFT BREAST CANCER.  The various methods of treatment have been discussed with the patient and family. After consideration of risks, benefits and other options for treatment, the patient has consented to  Procedure(s): LEFT SIMPLE MASTECTOMY, LEFT SENTINEL LYMPH NODE MAPPING (Left) RIGHT RISK REDUCING MASTECTOMY (Right) as a surgical intervention.  The patient's history has been reviewed, patient examined, no change in status, stable for surgery.  I have reviewed the patient's chart and labs.  Questions were answered to the patient's satisfaction.    The surgical and non surgical options have been discussed with the patient.  Risks of surgery include bleeding,  Infection,  Flap necrosis,  Tissue loss,  Chronic pain, death, Numbness,  And the need for additional procedures.  Reconstruction options also have been discussed with the patient as well.  The patient agrees to proceed. Sentinel lymph node mapping and dissection has been discussed with the patient.  Risk of bleeding,  Infection,  Seroma formation,  Additional procedures,,  Shoulder weakness ,  Shoulder stiffness,  Nerve and blood vessel injury and reaction to the mapping dyes have been discussed.  Alternatives to surgery have been discussed with the patient.  The patient agrees to proceed.  Elizabethann Lackey A Haely Leyland

## 2022-08-16 ENCOUNTER — Encounter (HOSPITAL_BASED_OUTPATIENT_CLINIC_OR_DEPARTMENT_OTHER): Payer: Self-pay | Admitting: Surgery

## 2022-08-16 ENCOUNTER — Other Ambulatory Visit (HOSPITAL_COMMUNITY): Payer: Commercial Managed Care - PPO

## 2022-08-20 ENCOUNTER — Encounter: Payer: Self-pay | Admitting: Surgery

## 2022-08-25 ENCOUNTER — Encounter: Payer: Self-pay | Admitting: Family Medicine

## 2022-09-04 ENCOUNTER — Other Ambulatory Visit (HOSPITAL_COMMUNITY): Payer: Commercial Managed Care - PPO

## 2022-09-17 NOTE — Progress Notes (Signed)
[facility] [address] [place] [address]    Clinic Day:  09/18/2022  Referring physician: Babs Sciara, MD  Patient Care Team: Babs Sciara, MD as PCP - General (Family Medicine) Doreatha Massed, MD as Medical Oncologist (Medical Oncology) Therese Sarah, RN as Oncology Nurse Navigator (Medical Oncology)   ASSESSMENT & PLAN:   Assessment: 1.  Stage I (T1 cN0 G3 ER/PR/HER2+) left breast IDC UOQ: - She felt a lump in her left breast and was evaluated by Dr. Gerda Diss. - Diagnostic mammogram (07/16/2022): Irregular hypoechoic mass in the left breast at 1:00 7 cm from nipple measuring 1.9 x 0.9 x 1.4 cm.  Suspicious 1.1 cm group of calcifications in the lower outer right breast. - US biopsy of the left breast 1 o'clock mass (07/16/2022): Grade 3 IDC, ER 99% positive, moderate to strong staining, PR 85% positive, moderate to strong staining, Ki-67 35%, HER2 IHC 2+, HER2 FISH positive. - Right breast lower outer quadrant stereotactic biopsy (07/26/2022): Benign - Germline mutation testing: BRCA2 pathogenic variant, heterozygous - Left simple mastectomy, sentinel lymph node biopsy and right risk-reducing simple mastectomy by Dr. Luisa Hart on 08/15/2022 - Pathology: Left breast IDC x 2, main mass 2.4 x 2.3 x 1.6 cm, grade 3.  Incidental mass 1 cm in greatest dimension, grade 2/3.  Invasive margins main mass 1 mm from deep margin.  Incidental mass positive at the posterolateral anterior margin. - It is noted that a sentinel lymph node was submitted.  Sentinel lymph node was not identified grossly.  However, there are incidental tumor present at the posterolateral anterior margin could represent surgically resected lymph node.   2.  Left breast UIQ IDC: - Diagnosed in 2008, s/p lumpectomy (03/13/2006): 7 mm well-differentiated IDC, ER 79% positive, PR 92% positive, HER2 negative.  SLNB x 4 negative. - Declined antiestrogen therapy. - Completed XRT to left breast  (04/2006).   3.  Social/family history: - Lives with husband Kathlene November at home.  She works as a Corporate treasurer.  Non-smoker.  No chemical exposure. - 2 paternal aunts had breast cancer.  1 maternal aunt had breast cancer.  Mother has osteoporosis.    Plan: 1.  Stage I (T1 cN1 G3 ER/PR/HER2+) left breast IDC: - I have reviewed results of pathology with the patient and her husband in detail. - I would consider the second incidental tumor nodule as the sentinel lymph node. - Recommend adjuvant chemotherapy with TCHP regimen. - Given her history of reactions to medications, recommend dose reduction with first cycle.  She will be evaluated in our symptom management clinic 1 week after first cycle. - We discussed side effects in detail.  Literature was given about the medications. - Will have port placed by Dr. Luisa Hart.  She will start chemotherapy after the port placement.   2.  Osteoporosis: - Reviewed DEXA scan from 08/13/2022, T-score -2.7. - Discussed the role of bisphosphonates both oral and subcutaneous.  She would like to hold off on any treatment for osteoporosis at this time until she finishes chemotherapy.  3.  High risk drug monitoring: - Reviewed echo from 08/12/2022, LVEF 65 to 70%. - Recommend checking echocardiogram every 3 months while on Herceptin and pertuzumab.  Discussed rare chance of decreased ejection fraction with these medications.    Orders Placed This Encounter  Procedures   Magnesium    Standing Status:   Future    Standing Expiration Date:   10/02/2023   CBC with Differential  Standing Status:   Future    Standing Expiration Date:   10/02/2023   Comprehensive metabolic panel    Standing Status:   Future    Standing Expiration Date:   10/02/2023   Magnesium    Standing Status:   Future    Standing Expiration Date:   10/23/2023   CBC with Differential    Standing Status:   Future    Standing Expiration Date:   10/23/2023   Comprehensive metabolic panel     Standing Status:   Future    Standing Expiration Date:   10/23/2023   Magnesium    Standing Status:   Future    Standing Expiration Date:   11/13/2023   CBC with Differential    Standing Status:   Future    Standing Expiration Date:   11/13/2023   Comprehensive metabolic panel    Standing Status:   Future    Standing Expiration Date:   11/13/2023      I,Katie Daubenspeck,acting as a scribe for Doreatha Massed, MD.,have documented all relevant documentation on the behalf of Doreatha Massed, MD,as directed by  Doreatha Massed, MD while in the presence of Doreatha Massed, MD.   I, Doreatha Massed MD, have reviewed the above documentation for accuracy and completeness, and I agree with the above.   Doreatha Massed, MD   9/4/20241:12 PM  CHIEF COMPLAINT:   Diagnosis: HER2 positive left breast cancer    Cancer Staging  Adenocarcinoma of breast [facility]) Staging form: Breast, AJCC 7th Edition - Clinical: Stage IA (T1b, N0, cM0) - Signed by Ellouise Newer, PA on 05/20/2011  Breast cancer of upper-outer quadrant of left female breast [facility]) Staging form: Breast, AJCC 8th Edition - Clinical stage from 07/31/2022: Stage IB (cT2, cN1, cM0, G3, ER+, PR+, HER2+) - Unsigned    Prior Therapy: left lumpectomy and XRT in 2008  Current Therapy: TCHP   HISTORY OF PRESENT ILLNESS:   Oncology History  Breast cancer of upper-outer quadrant of left female breast (HCC)  07/31/2022 Initial Diagnosis   Breast cancer of upper-outer quadrant of left female breast (HCC)   10/02/2022 -  Chemotherapy   Patient is on Treatment Plan : BREAST  Docetaxel + Carboplatin + Trastuzumab + Pertuzumab  (TCHP) q21d / Trastuzumab + Pertuzumab q21d        INTERVAL HISTORY:   Melissa Rodriguez is a 57 y.o. female presenting to clinic today for follow up of left breast cancer. She was last seen by me on 08/10/22 in consultation.  Genetic testing revealed BRCA2 mutation.  In light of her BRCA2  positivity, she proceeded with bilateral mastectomies on 08/15/22 under Dr. Luisa Hart. Pathology from the left breast showed: two foci of IDC, main mass measuring 24 mm (grade 3) and incidental mass 10 mm (grade 2); DCIS present in both foci; positive margin along incidental mass. Pathology report notes that the incidental mass was submitted as a suspected lymph node, but there was no lymphoid tissue present.  Of note, pathology from right breast was overall benign.  Today, she states that she is doing well overall. Her appetite level is at 100%. Her energy level is at 80%.  PAST MEDICAL HISTORY:   Past Medical History: Past Medical History:  Diagnosis Date   Adenocarcinoma of breast (HCC) 05/20/2011   Stage I (T1b N0), grade 1, well-differentiated adenocarcinoma of the medial lower quadrant of the left breast. It was a tubular carcinoma, status post lumpectomy on 03/13/2006, ER positive 79%, PR positive 92%, HER2/neu  negative, Ki-67 marker low at 18%. Her cancer was 7 mm in size, and she had 4 sentinel nodes all negative along with her lumpectomy. She was treated with radiation therapy postoper   Complication of anesthesia    pt states her bladder did not wake up after one surgery. Went to ER, had a urinary catheter for 1 week   History of kidney stones    Iron deficiency    Iron deficiency anemia 05/20/2011   Secondary to GU bleeding.  Good absorber of PO iron   Personal history of radiation therapy     Surgical History: Past Surgical History:  Procedure Laterality Date   BREAST BIOPSY Left 07/16/2022   [place] LT BREAST BX W LOC DEV 1ST LESION IMG BX SPEC US GUIDE 07/16/2022 Edwin Cap, MD [facility]   BREAST BIOPSY Right 07/26/2022   MM RT BREAST BX W LOC DEV 1ST LESION IMAGE BX SPEC STEREO GUIDE 07/26/2022 [facility]   BREAST LUMPECTOMY Left    EXTRACORPOREAL SHOCK WAVE LITHOTRIPSY Left 02/26/2018   Procedure: EXTRACORPOREAL SHOCK WAVE LITHOTRIPSY (ESWL);  Surgeon: Malen Gauze, MD;  Location: [facility];  Service: Urology;  Laterality: Left;   LUMPECTOM LEFT BREAST  02/2006   LYMPH NODE BIOPSY LEFT  04/2006   MASTECTOMY W/ SENTINEL NODE BIOPSY Left 08/15/2022   Procedure: LEFT SIMPLE MASTECTOMY, LEFT SENTINEL LYMPH NODE MAPPING;  Surgeon: Harriette Bouillon, MD;  Location: [facility];  Service: General;  Laterality: Left;   SIMPLE MASTECTOMY WITH AXILLARY SENTINEL NODE BIOPSY Right 08/15/2022   Procedure: RIGHT RISK REDUCING MASTECTOMY;  Surgeon: Harriette Bouillon, MD;  Location: [facility];  Service: General;  Laterality: Right;    Social History: Social History   Socioeconomic History   Marital status: Married    Spouse name: Not on file   Number of children: Not on file   Years of education: Not on file   Highest education level: Not on file  Occupational History   Not on file  Tobacco Use   Smoking status: Never   Smokeless tobacco: Never  Vaping Use   Vaping status: Never Used  Substance and Sexual Activity   Alcohol use: No   Drug use: No   Sexual activity: Yes  Other Topics Concern   Not on file  Social History Narrative   Not on file   Social Determinants of Health   Financial Resource Strain: Not on file  Food Insecurity: Not on file  Transportation Needs: Not on file  Physical Activity: Not on file  Stress: Not on file  Social Connections: Not on file  Intimate Partner Violence: Not on file    Family History: Family History  Problem Relation Age of Onset   Hypertension Mother    Hypertension Father    Heart disease Father 93       MI in his 58s   Cancer Maternal Aunt        unk type, possibly hip. dx >50   Breast cancer Paternal Aunt        dx 4s   Cancer Paternal Aunt        unknown, d. 41s   Prostate cancer Cousin        dx 56s-50s   Stomach cancer Cousin        dx 40s-50s    Current Medications:  Current Outpatient Medications:    Bioflavonoid Products (GRAPE SEED PO), Take 1 capsule  by mouth daily., Disp: , Rfl:  Calcium Carbonate Antacid (TUMS PO), Take 1 tablet by mouth as needed. Reported on 05/22/2015, Disp: , Rfl:    Cholecalciferol (VITAMIN D PO), Take 1,000 Units by mouth daily. , Disp: , Rfl:    Allergies: Allergies  Allergen Reactions   Benadryl [Diphenhydramine Hcl]     Jittery and Hallucinations   Diphenhydramine Hcl Anxiety    Other Reaction(s): Hallucination  Jittery and Hallucinations    Made her feel like she was crawling the walls.   Hydrocodone Shortness Of Breath    Felt like she could not breathe-February 2020   Penicillins Hives and Rash    Did it involve swelling of the face/tongue/throat, SOB, or low BP? No  Did it involve sudden or severe rash/hives, skin peeling, or any reaction on the inside of your mouth or nose? No  Did you need to seek medical attention at a hospital or doctor's office? No  When did it last happen?        If all above answers are "NO", may proceed with cephalosporin use.  Any medicine related to PCN    Did it involve swelling of the face/tongue/throat, SOB, or low BP? No Did it involve sudden or severe rash/hives, skin peeling, or any reaction on the inside of your mouth or nose? No Did you need to seek medical attention at a hospital or doctor's office? No When did it last happen?       If all above answers are "NO", may proceed with cephalosporin use.   Ciprofloxacin Hives    REVIEW OF SYSTEMS:   Review of Systems  Constitutional:  Negative for chills, fatigue and fever.  HENT:   Negative for lump/mass, mouth sores, nosebleeds, sore throat and trouble swallowing.   Eyes:  Negative for eye problems.  Respiratory:  Negative for cough and shortness of breath.   Cardiovascular:  Positive for palpitations. Negative for chest pain and leg swelling.  Gastrointestinal:  Negative for abdominal pain, constipation, diarrhea, nausea and vomiting.  Genitourinary:  Negative for bladder incontinence, difficulty  urinating, dysuria, frequency, hematuria and nocturia.   Musculoskeletal:  Negative for arthralgias, back pain, flank pain, myalgias and neck pain.  Skin:  Negative for itching and rash.  Neurological:  Negative for dizziness, headaches and numbness.  Hematological:  Does not bruise/bleed easily.  Psychiatric/Behavioral:  Negative for depression, sleep disturbance and suicidal ideas. The patient is not nervous/anxious.   All other systems reviewed and are negative.    VITALS:   Blood pressure (!) 140/61, pulse 77, temperature 97.8 F (36.6 C), temperature source Oral, resp. rate 18, weight 96 lb 3.2 oz (43.6 kg), last menstrual period 07/05/2012, SpO2 100%.  Wt Readings from Last 3 Encounters:  09/18/22 96 lb 3.2 oz (43.6 kg)  08/15/22 95 lb 0.3 oz (43.1 kg)  07/31/22 97 lb 9.6 oz (44.3 kg)    Body mass index is 17.04 kg/m.  Performance status (ECOG): 1 - Symptomatic but completely ambulatory  PHYSICAL EXAM:   Physical Exam Vitals and nursing note reviewed. Exam conducted with a chaperone present.  Constitutional:      Appearance: Normal appearance.  Cardiovascular:     Rate and Rhythm: Normal rate and regular rhythm.     Pulses: Normal pulses.     Heart sounds: Normal heart sounds.  Pulmonary:     Effort: Pulmonary effort is normal.     Breath sounds: Normal breath sounds.  Abdominal:     Palpations: Abdomen is soft. There is no hepatomegaly, splenomegaly or  mass.     Tenderness: There is no abdominal tenderness.  Musculoskeletal:     Right lower leg: No edema.     Left lower leg: No edema.  Lymphadenopathy:     Cervical: No cervical adenopathy.     Right cervical: No superficial, deep or posterior cervical adenopathy.    Left cervical: No superficial, deep or posterior cervical adenopathy.     Upper Body:     Right upper body: No supraclavicular or axillary adenopathy.     Left upper body: No supraclavicular or axillary adenopathy.  Neurological:     General: No  focal deficit present.     Mental Status: She is alert and oriented to person, place, and time.  Psychiatric:        Mood and Affect: Mood normal.        Behavior: Behavior normal.     LABS:      Latest Ref Rng & Units 07/31/2022    9:02 AM 02/22/2018   11:09 AM 05/17/2013    8:19 AM  CBC  WBC 4.0 - 10.5 K/uL 8.4  9.3  9.4   Hemoglobin 12.0 - 15.0 g/dL 60.4  54.0  98.1   Hematocrit 36.0 - 46.0 % 42.7  43.1  40.2   Platelets 150 - 400 K/uL 170  174  120       Latest Ref Rng & Units 07/31/2022    9:02 AM 02/22/2018   11:09 AM 05/17/2013    8:19 AM  CMP  Glucose 70 - 99 mg/dL 191  478  97   BUN 6 - 20 mg/dL 9  17  10    Creatinine 0.44 - 1.00 mg/dL 2.95  6.21  3.08   Sodium 135 - 145 mmol/L 136  140  141   Potassium 3.5 - 5.1 mmol/L 3.5  3.8  3.9   Chloride 98 - 111 mmol/L 101  103  101   CO2 22 - 32 mmol/L 29  27  32   Calcium 8.9 - 10.3 mg/dL 9.3  65.7  9.9   Total Protein 6.5 - 8.1 g/dL 6.5  7.4  6.8   Total Bilirubin 0.3 - 1.2 mg/dL 0.7  0.7  0.6   Alkaline Phos 38 - 126 U/L 49  45  57   AST 15 - 41 U/L 14  26  20    ALT 0 - 44 U/L 13  21  12       No results found for: "CEA1", "CEA" / No results found for: "CEA1", "CEA" No results found for: "PSA1" No results found for: "QIO962" No results found for: "CAN125"  No results found for: "TOTALPROTELP", "ALBUMINELP", "A1GS", "A2GS", "BETS", "BETA2SER", "GAMS", "MSPIKE", "SPEI" Lab Results  Component Value Date   TIBC 242 (L) 05/17/2013   TIBC 292 11/18/2012   TIBC 314 05/18/2012   FERRITIN 95 05/17/2013   FERRITIN 59 11/18/2012   FERRITIN 25 05/18/2012   IRONPCTSAT 16 (L) 05/17/2013   IRONPCTSAT 39 11/18/2012   IRONPCTSAT 23 05/18/2012   No results found for: "LDH"   STUDIES:   No results found.

## 2022-09-18 ENCOUNTER — Encounter: Payer: Self-pay | Admitting: Hematology

## 2022-09-18 ENCOUNTER — Inpatient Hospital Stay: Payer: Commercial Managed Care - PPO | Attending: Hematology | Admitting: Hematology

## 2022-09-18 VITALS — BP 140/61 | HR 77 | Temp 97.8°F | Resp 18 | Wt 96.2 lb

## 2022-09-18 DIAGNOSIS — Z170 Estrogen receptor positive status [ER+]: Secondary | ICD-10-CM | POA: Insufficient documentation

## 2022-09-18 DIAGNOSIS — Z803 Family history of malignant neoplasm of breast: Secondary | ICD-10-CM | POA: Diagnosis not present

## 2022-09-18 DIAGNOSIS — M810 Age-related osteoporosis without current pathological fracture: Secondary | ICD-10-CM | POA: Insufficient documentation

## 2022-09-18 DIAGNOSIS — Z1509 Genetic susceptibility to other malignant neoplasm: Secondary | ICD-10-CM | POA: Insufficient documentation

## 2022-09-18 DIAGNOSIS — Z853 Personal history of malignant neoplasm of breast: Secondary | ICD-10-CM | POA: Insufficient documentation

## 2022-09-18 DIAGNOSIS — C50412 Malignant neoplasm of upper-outer quadrant of left female breast: Secondary | ICD-10-CM | POA: Insufficient documentation

## 2022-09-18 DIAGNOSIS — Z923 Personal history of irradiation: Secondary | ICD-10-CM | POA: Insufficient documentation

## 2022-09-18 DIAGNOSIS — Z1501 Genetic susceptibility to malignant neoplasm of breast: Secondary | ICD-10-CM | POA: Insufficient documentation

## 2022-09-18 DIAGNOSIS — Z9013 Acquired absence of bilateral breasts and nipples: Secondary | ICD-10-CM | POA: Diagnosis not present

## 2022-09-18 NOTE — Patient Instructions (Signed)
[facility] at [facility] Discharge Instructions   You were seen and examined today by Dr. Ellin Saba.  He reviewed the results of your biopsy with your. It is showing you had a cancer that is estrogen receptor positive and HER-2 positive. The HER-2 positive makes this cancer more aggressive and more likely to come back.  The cancer was fully removed, but the treatment is to help eliminate any microscopic cancer cells that may still be floating in the bloodstream and help prevent the cancer from coming back.   He discussed with you a treatment regimen for this cancer. The treatment consists of four different drugs - two chemotherapy drugs and two monoclonal antibodies. The chemotherapy drugs are called Taxotere and carboplatin. The antibodies are called Herceptin and Perjeta. Treatment is given every 3 weeks. All four drugs are given every 3 weeks for 6 times. After the 6th cycle, the chemotherapy drugs will fall off of the treatment plan and you will continue to receive Herceptin and Perjeta every 3 weeks to total one year of treatment with these antibodies.  We will arrange for you to have a port placed to administer chemotherapy. We will reach out to Dr. Luisa Hart to have this done.   We will have you come in to have a chemotherapy education session with Revonda Standard, our chemotherapy educator.   Return as scheduled.      Thank you for choosing [facility] at [facility] to provide your oncology and hematology care.  To afford each patient quality time with our provider, please arrive at least 15 minutes before your scheduled appointment time.   If you have a lab appointment with the Cancer Center please come in thru the Main Entrance and check in at the main information desk.  You need to re-schedule your appointment should you arrive 10 or more minutes late.  We strive to give you quality time with our providers, and arriving late affects you and  other patients whose appointments are after yours.  Also, if you no show three or more times for appointments you may be dismissed from the clinic at the providers discretion.     Again, thank you for choosing [facility].  Our hope is that these requests will decrease the amount of time that you wait before being seen by our physicians.       _____________________________________________________________  Should you have questions after your visit to [facility], please contact our office at [phone] and follow the prompts.  Our office hours are 8:00 a.m. and 4:30 p.m. Monday - Friday.  Please note that voicemails left after 4:00 p.m. may not be returned until the following business day.  We are closed weekends and major holidays.  You do have access to a nurse 24-7, just call the main number to the clinic [phone] and do not press any options, hold on the line and a nurse will answer the phone.    For prescription refill requests, have your pharmacy contact our office and allow 72 hours.    Due to Covid, you will need to wear a mask upon entering the hospital. If you do not have a mask, a mask will be given to you at the Main Entrance upon arrival. For doctor visits, patients may have 1 support person age 68 or older with them. For treatment visits, patients can not have anyone with them due to social distancing guidelines and our immunocompromised population.

## 2022-09-18 NOTE — Progress Notes (Signed)
START ON PATHWAY REGIMEN - Breast     Cycle 1: A cycle is 21 days:     Pertuzumab      Trastuzumab-xxxx      Docetaxel      Carboplatin    Cycles 2 through 6: A cycle is every 21 days:     Pertuzumab      Trastuzumab-xxxx      Docetaxel      Carboplatin    Cycles 7 through 17: A cycle is every 21 days:     Pertuzumab      Trastuzumab-xxxx   **Always confirm dose/schedule in your pharmacy ordering system**  Patient Characteristics: Postoperative without Neoadjuvant Therapy, M0 (Pathologic Staging), Invasive Disease, Adjuvant Therapy, HER2 Positive, ER Positive, Node Positive, pT1, pN1a or Higher Therapeutic Status: Postoperative without Neoadjuvant Therapy, M0 (Pathologic Staging) AJCC Grade: G3 AJCC N Category: pN1 AJCC M Category: cM0 ER Status: Positive (+) AJCC 8 Stage Grouping: IA HER2 Status: Positive (+) Oncotype Dx Recurrence Score: Not Appropriate AJCC T Category: pT1c PR Status: Positive (+) Intent of Therapy: Curative Intent, Discussed with Patient

## 2022-09-19 ENCOUNTER — Ambulatory Visit: Payer: Self-pay | Admitting: Surgery

## 2022-09-23 ENCOUNTER — Encounter: Payer: Self-pay | Admitting: Hematology

## 2022-09-25 ENCOUNTER — Other Ambulatory Visit: Payer: Self-pay

## 2022-10-01 ENCOUNTER — Encounter (HOSPITAL_BASED_OUTPATIENT_CLINIC_OR_DEPARTMENT_OTHER): Payer: Self-pay | Admitting: Surgery

## 2022-10-01 ENCOUNTER — Other Ambulatory Visit: Payer: Self-pay

## 2022-10-02 NOTE — Patient Instructions (Incomplete)
[facility] Chemotherapy Teaching   You will be treated in the clinic every 3 weeks with a combination of chemotherapy drugs and monoclonal antibodies. The drugs you will receive are docetaxel (Taxotere), carboplatin, trastuzumab (Herceptin), and pertuzumab (Perjeta).  We will stop the chemotherapy drugs after 6 cycles of treatment.  We will continue with the Herceptin and Perjeta to complete one year of treatment. The intent of treatment is cure. You will see the doctor regularly throughout treatment.  We will obtain blood work from you prior to every treatment and monitor your results to make sure it is safe to give your treatment. The doctor monitors your response to treatment by the way you are feeling, your blood work, and by obtaining scans periodically.  There will be wait times while you are here for treatment. It will take about 30 minutes to 1 hour for your lab work to result.  Then there will be wait times while pharmacy mixes your medications. You will see the doctor regularly throughout treatment.  We will obtain blood work from you prior to every treatment and monitor your results to make sure it is safe to give your treatment. The doctor monitors your response to treatment by the way you are feeling, your blood work, and by obtaining scans periodically.  There will be wait times while you are here for treatment.  It will take about 30 minutes to 1 hour for your lab work to result.  Then there will be wait times while pharmacy mixes your medications.    Medications you will receive in the clinic prior to your chemotherapy medications:  Aloxi:  ALOXI is used in adults to help prevent the nausea and vomiting that happens with certain chemotherapy drugs.  Aloxi is a long acting medication, and will remain in your system for about.   Pepcid:  This medication is a histamine blocker that helps prevent and allergic reaction to your chemotherapy.   Dexamethasone:  This is a steroid  given prior to chemotherapy to help prevent allergic reactions; it may also help prevent and control nausea and diarrhea.   Tylenol:  Given prior to immunotherapy infusions to prevent infusion reactions such as fever/chills.   Docetaxel (Taxotere)  About This Drug  Docetaxel is used to treat cancer. It is given in the vein (IV) through your port a cath.  It will take 1 hour to infuse. Your first infusion will take longer than 1 hour due to the fact that we start it at a very slow rate and gradually increase the rate until the maximum infusion rate is reached.  This is done in order to monitor you closely for allergic/infusion reactions.  Your nurse will remain in the room with you for the first 15 minutes of this infusion on your first time getting it.  If you tolerate the first infusion without adverse reactions, going forward we will give you this drug at the normal infusion rate over 1 hour.   Possible Side Effects  Bone marrow suppression. This is a decrease in the number of white blood cells, red blood cells, and platelets. This may raise your risk of infection, make you tired and weak (fatigue), and raise your risk of bleeding.   Fever in the setting of decreased white blood cells, which is a serious condition that can be lifethreatening   Soreness of the mouth and throat. You may have red areas, white patches, or sores that hurt.   Nausea and vomiting (throwing up)  Constipation (not able to move bowels)   Diarrhea (loose bowel movements)   Infections   Swelling of your legs, ankles and/or feet, or fluid build-up around your lungs, heart or elsewhere    Changes in the way food and drinks taste   Effects on the nerves are called peripheral neuropathy. You may feel numbness, tingling, or pain in your hands and feet. It may be hard for you to button your clothes, open jars, or walk as usual. The effect on the nerves may get worse with more doses of the drug. These effects get  better in some people after the drug is stopped but it does not get better in all people.   Decreased appetite (decreased hunger)   Weakness   Pain   Muscle pain/aching   Trouble breathing   Changes in your nail color, you may have nail loss and/or brittle nail    Hair loss. Hair loss is often temporary, although there have been cases of permanent hair loss reported. Hair loss may happen suddenly or gradually. If you lose hair, you may lose it from your head, face, armpits, pubic area, chest, and/or legs. You may also notice your hair getting thin.   Allergic skin reaction. You may develop blisters on your skin that are filled with fluid or a severe red rash all over your body that may be painful.   Allergic reactions, including anaphylaxis are rare but may happen in some patients. Signs of allergic reaction to this drug may be swelling of the face, feeling like your tongue or throat are swelling, trouble breathing, rash, itching, fever, chills, feeling dizzy, and/or feeling that your heart is beating in a fast or not normal way. If this happens, do not take another dose of this drug. You should get urgent medical treatment.  Note: Not all possible side effects are included above.  Warnings and Precautions  Severe bone marrow suppression, including febrile neutropenia - fever in the setting of decreased white blood cells, which may be life threatening.   Severe allergic reactions, including anaphylaxis which can be life-threatening   Swelling (inflammation) in the colon in the setting of severely low white blood cells, which raises your risk of infection and can be life-threatening   Severe skin reactions, including redness, swelling or peeling of skin   Severe swelling in the eye or other changes in eyesight   Severe swelling of your legs, ankles and/or feet. Sometimes, fluid can build up in your lungs and/or around your heart causing you trouble breathing.   If you have a history  of abnormal liver function, receive high doses of docetaxel, or have a history of lung cancer and have received treatment with a platinum (type of chemotherapy medication), you have an increased risk of death.   Severe weakness   This drug may raise your risk of getting a second cancer such as leukemia and myelodysplastic syndrome.   Severe peripheral neuropathy - numbness, tingling, or pain in your hands and feet  This drug contains alcohol and may affect your central nervous system. The central nervous system is made up of your brain and spinal cord. You may feel drunk during and after your treatment and it can impair your ability to drive or use machinery for one to two hours after infusion.    Tumor lysis syndrome: This drug may act on the cancer cells very quickly. This may affect how your kidneys work.  Note: Some of the side effects above are very rare.  If you have concerns and/or questions, please discuss them with your medical team.  Important Information  This drug may be present in the saliva, tears, sweat, urine, stool, vomit, semen, and vaginal secretions. Talk to your doctor and/or your nurse about the necessary precautions to take during this time.  Treating Side Effects  Manage tiredness by pacing your activities for the day.   Be sure to include periods of rest between energy-draining activities.   Get regular exercise. If you feel too tired to exercise vigorously, try taking a short walk.   To decrease the risk of infection, wash your hands regularly.   Avoid close contact with people who have a cold, the flu, or other infections.   Take your temperature as your doctor or nurse tells you, and whenever you feel like you may have a fever.   To help decrease the risk of bleeding, use a soft toothbrush. Check with your nurse before using dental floss.   Be very careful when using knives or tools.   Use an electric shaver instead of a razor.   Mouth care is very  important and will help food taste better and improve your appetite. Your mouth care should consist of routine, gentle cleaning of your teeth or dentures and rinsing your mouth with a mixture of 1/2 teaspoon of salt in 8 ounces of water or 1/2 teaspoon of baking soda in 8 ounces of water. This should be done at least after each meal and at bedtime.   If you have mouth sores, avoid mouthwash that has alcohol. Also avoid alcohol and smoking because they can bother your mouth and throat.   Ask your doctor or nurse about medicines that are available to help stop or lessen constipation and/or diarrhea.   If you are not able to move your bowels, check with your doctor or nurse before you use enemas, laxatives, or suppositories.   Drink plenty of fluids (a minimum of eight glasses per day - 64 oz -  is recommended).   If you throw up or have loose bowel movements, you should drink more fluids so that you do not become dehydrated (lack of water in the body from losing too much fluid).   If you have diarrhea, eat low-fiber foods that are high in protein and calories and avoid foods that can irritate your digestive tracts or lead to cramping.   To help with nausea and vomiting, eat small, frequent meals instead of three large meals a day. Choose foods and drinks that are at room temperature. Ask your nurse or doctor about other helpful tips and medicine that is available to help stop or lessen these symptoms.   To help with decreased appetite, eat foods high in calories and protein, such as meat, poultry, fish, dry beans, tofu, eggs, nuts, milk, yogurt, cheese, ice cream, pudding, and nutritional supplements.   Consider using sauces and spices to increase taste. Daily exercise, with your doctor's approval, may increase your appetite.   Keeping your pain under control is important to your well-being. Please tell your doctor or nurse if you are experiencing pain.   If you get a rash do not put anything on  it unless your doctor or nurse says you may. Keep the area around the rash clean and dry. Ask your doctor for medicine if your rash bothers you.   Keeping your nails moisturized may help with brittleness.   To help with hair loss, wash with a mild shampoo and avoid washing  your hair every day.   Avoid rubbing your scalp, pat your hair or scalp dry.   Avoid coloring your hair.   Limit your use of hair spray, electric curlers, blow dryers, and curling irons.   If you are interested in getting a wig, talk to your nurse. You can also call the [employer] at 800-ACS-2345 to find out information about the "Look Good, Feel Better" program close to where you live. It is a free program where women getting chemotherapy can learn about wigs, turbans and scarves as well as makeup techniques and skin and nail care.   If you have numbness and tingling in your hands and feet, be careful when cooking, walking, and handling sharp objects and hot liquids.  Food and Drug Interactions  There are no known interactions of docetaxel with food.   This drug may interact with other medicines. Tell your doctor and pharmacist about all the prescription and over-the-counter medicines and dietary supplements (vitamins, minerals, herbs and others) that you are taking at this time. Also, check with your doctor or pharmacist before starting any new prescription or over-the-counter medicines, or dietary supplements to make sure that there are no interactions.  When to Call the Doctor Call your doctor or nurse if you have any of these symptoms and/or any new or unusual symptoms:   Fever of 100.4 F (38 C) or higher   Chills   Blurred vision or other changes in eyesight   Easy bruising or bleeding   Wheezing or trouble breathing   Chest pain   Feeling dizzy or lightheaded   Tiredness that interferes with your daily activities   Pain in your mouth or throat that makes it hard to eat or drink    Nausea that stops you from eating or drinking and/or is not relieved by prescribed medicines   Throwing up   Lasting loss of appetite or rapid weight loss of five pounds in a week   Diarrhea, 4 times in one day or diarrhea with lack of strength or a feeling of being dizzy   No bowel movement in 3 days or when you feel uncomfortable   Severe abdominal pain that does not go away   Blood in your stool   Numbness, tingling, or pain in your hands and feet   Swelling of legs, ankles, or feet   Weight gain of 5 pounds in one week (fluid retention)   Extreme weakness that interferes with normal activities   New rash and/or itching   Rash that is not relieved by prescribed medicines   Signs of inflammation/infection (redness, swelling, pain) of the tissue around your nails.   Signs of allergic reaction: swelling of the face, feeling like your tongue or throat are swelling, trouble breathing, rash, itching, fever, chills, feeling dizzy, and/or feeling that your heart is beating in a fast or not normal way. If this happens, call 911 for emergency care.   Flu-like symptoms: fever, headache, muscle and joint aches, and fatigue (low energy, feeling weak)   Signs of possible liver problems: dark urine, pale bowel movements, bad stomach pain, feeling very tired and weak, unusual itching, or yellowing of the eyes or skin   Symptoms of being drunk, confusion, or being very sleepy   Confusion or agitation, decreased urine, nausea/vomiting, diarrhea, muscle cramping, numbness and/or tingling, seizures   General pain that does not go away or is not relieved by prescribed medicine   If you think you may be pregnant or  have impregnated your partner  Reproduction Warnings  Pregnancy warning: This drug can have harmful effects on the unborn baby. Women of childbearing potential should use effective methods of birth control during your cancer treatment and for 6 months after treatment. Men with  female partners of childbearing potential should use effective methods of birth control during your cancer treatment and for 3 months after your cancer treatment. Let your doctor know right away if you think you may be pregnant or may have impregnated your partner.   Breastfeeding warning: Women should not breastfeed during treatment and for 1 week after treatment because this drug could enter the breast milk and cause harm to a breastfeeding baby.    Fertility warning: In men, this drug may affect your ability to have children in the future. Talk with your doctor or nurse if you plan to have children. Ask for information on sperm banking.   Carboplatin  About This Drug Carboplatin is used to treat cancer. It is given in the vein (IV) through your port a cath.  It will take 30 minutes to infuse.   Possible Side Effects  Bone marrow suppression. This is a decrease in the number of white blood cells, red blood cells, and platelets. This may raise your risk of infection, make you tired and weak (fatigue), and raise your risk of bleeding.   Nausea and vomiting (throwing up)   Weakness   Changes in your liver function   Changes in your kidney function   Electrolyte changes   Pain   Effects on the nerves are called peripheral neuropathy. You may feel numbness, tingling, or pain in your hands and feet. It may be hard for you to button your clothes, open jars, or walk as usual. The effect on the nerves may get worse with more doses of the drug. These effects get better in some people after the drug is stopped but it does not get better in all people.   Note: Not all possible side effects are included above.  Warnings and Precautions  Severe bone marrow suppression   Allergic reactions, including anaphylaxis are rare but may happen in some patients. Signs of allergic reaction to this drug may be swelling of the face, feeling like your tongue or throat are swelling, trouble breathing, rash,  itching, fever, chills, feeling dizzy, and/or feeling that your heart is beating in a fast or not normal way. If this happens, do not take another dose of this drug. You should get urgent medical treatment.   Severe nausea and vomiting   Peripheral neuropathy - The risk is increased if you are over the age of 70 or if you have received other medicine with risk of peripheral neuropathy.   Blurred vision, loss of vision or other changes in eyesight   Decreased hearing   Skin and tissue irritation including redness, pain, warmth, or swelling at the IV site if the drug leaks out of the vein and into nearby tissue   Severe changes in your kidney function, which can cause kidney failure   Severe changes in your liver function, which can cause liver failure  Note: Some of the side effects above are very rare. If you have concerns and/or questions, please discuss them with your medical team.  Important Information  This drug may be present in the saliva, tears, sweat, urine, stool, vomit, semen, and vaginal secretions. Talk to your doctor and/or your nurse about the necessary precautions to take during this time.  Treating Side  Effects  Manage tiredness by pacing your activities for the day.   Be sure to include periods of rest between energy-draining activities.   To decrease the risk of infection, wash your hands regularly.   Avoid close contact with people who have a cold, the flu, or other infections.   Take your temperature as your doctor or nurse tells you, and whenever you feel like you may have a fever.   To help decrease the risk of bleeding, use a soft toothbrush. Check with your nurse before using dental floss.    Be very careful when using knives or tools.   Use an electric shaver instead of a razor.   Drink plenty of fluids (a minimum of eight glasses per day is recommended).   If you throw up or have loose bowel movements, you should drink more fluids so that you do not  become dehydrated (lack of water in the body from losing too much fluid).   To help with nausea and vomiting, eat small, frequent meals instead of three large meals a day. Choose foods and drinks that are at room temperature. Ask your nurse or doctor about other helpful tips and medicine that is available to help stop or lessen these symptoms.   If you have numbness and tingling in your hands and feet, be careful when cooking, walking, and handling sharp objects and hot liquids.   Keeping your pain under control is important to your well-being. Please tell your doctor or nurse if you are experiencing pain.  Food and Drug Interactions  There are no known interactions of carboplatin with food.   This drug may interact with other medicines. Tell your doctor and pharmacist about all the prescription and over-the-counter medicines and dietary supplements (vitamins, minerals, herbs and others) that you are taking at this time. Also, check with your doctor or pharmacist before starting any new prescription or over-the-counter medicines, or dietary supplements to make sure that there are no interactions.   When to Call the Doctor Call your doctor or nurse if you have any of these symptoms and/or any new or unusual symptoms:   Fever of 100.4 F (38 C) or higher   Chills   Tiredness that interferes with your daily activities   Feeling dizzy or lightheaded   Easy bleeding or bruising   Nausea that stops you from eating or drinking and/or is not relieved by prescribed medicines   Throwing up   Blurred vision or other changes in eyesight   Decrease in hearing or ringing in the ear   Signs of allergic reaction: swelling of the face, feeling like your tongue or throat are swelling, trouble breathing, rash, itching, fever, chills, feeling dizzy, and/or feeling that your heart is beating in a fast or not normal way. If this happens, call 911 for emergency care.   While you are getting this drug,  please tell your nurse right away if you have any pain, redness, or swelling at the site of the IV infusion.   Signs of possible liver problems: dark urine, pale bowel movements, bad stomach pain, feeling very tired and weak, unusual itching, or yellowing of the eyes or skin   Decreased urine, or very dark urine   Numbness, tingling, or pain in your hands and feet   Pain that does not go away or is not relieved by prescribed medicine   If you think you may be pregnant  Reproduction Warnings  Pregnancy warning: This drug may have harmful effects  on the unborn baby. Women of childbearing potential should use effective methods of birth control during your cancer treatment. Let your doctor know right away if you think you may be pregnant.   Breastfeeding warning: It is not known if this drug passes into breast milk. For this reason, women should not breastfeed during treatment because this drug could enter the breast milk and cause harm to a  breastfeeding baby.   Fertility warning: Human fertility studies have not been done with this drug. Talk with your doctor or nurse if you plan to have children. Ask for information on sperm or egg banking.   Pertuzumab (Perjeta)  About This Drug Pertuzumab is used to treat cancer. It is given in the vein (IV) through your port a cath.  The first infusion will be given over 1 hour.  Subsequent infusions will be given over 30 minutes.   Possible Side Effects  Bone marrow suppression. This is a decrease in the number of white blood cells, red blood cells, and platelets. This may raise your risk of infection, make you tired and weak (fatigue), and raise your risk of bleeding.   Nausea and vomiting (throwing up)   Diarrhea (loose bowel movements)   Not able to move bowels (constipation)   Tiredness   Headache   Muscle pain/aching   Effects on the nerves are called peripheral neuropathy. You may feel numbness, tingling, or pain in your hands and  feet. It may be hard for you to button your clothes, open jars, or walk as usual. The effect on the nerves may get worse with more doses of the drug. These effects get better in some people after the drug is stopped but it does not get better in all people.   Rash   Hair loss. Hair loss is often temporary, although with certain medicine, hair loss can sometimes be permanent. Hair loss may happen suddenly or gradually. If you lose hair, you may lose it from your head, face, armpits, pubic area, chest, and/or legs. You may also notice your hair getting thin.  Note: Each of the side effects above was reported in 30% or greater of patients treated with pertuzumab. Not all possible side effects are included above.  Warnings and Precautions  Congestive heart failure - your heart has less ability to pump blood properly. You may be short of breath. Your arms, hands, legs and feet may swell.    Allergic reactions, including anaphylaxis are rare but may happen in some patients. Signs of allergic reaction to this drug may be swelling of the face, feeling like your tongue or throat are swelling, trouble breathing, rash, itching, fever, chills, feeling dizzy, and/or feeling that your heart is beating in a fast or not normal way. If this happens, do not take another dose of this drug. You should get urgent medical treatment.   While you are getting this drug in your vein (IV), you may have a reaction to the drug. Sometimes you may be given medication to stop or lessen these side effects. Your nurse will check you closely for these signs: fever or shaking chills, flushing, facial swelling, feeling dizzy, headache, trouble breathing, rash, itching, chest tightness, or chest pain. These reactions may happen after your infusion. If this happens, call 911 for emergency care.  Note: Some of the side effects above are very rare. If you have concerns and/or questions, please discuss them with your medical  team.  Important Information  This drug may be present in  the saliva, tears, sweat, urine, stool, vomit, semen, and vaginal secretions. Talk to your doctor and/or your nurse about the necessary precautions to take during this time.  Treating Side Effects  Manage tiredness by pacing your activities for the day.   Be sure to include periods of rest between energy-draining activities.   To decrease the risk of infection, wash your hands regularly.   Avoid close contact with people who have a cold, the flu, or other infections.   Take your temperature as your doctor or nurse tells you, and whenever you feel like you may have a fever.   To help decrease the risk of bleeding, use a soft toothbrush. Check with your nurse before using dental floss.   Be very careful when using knives or tools.   Use an electric shaver instead of a razor.   Drink plenty of fluids (a minimum of eight glasses per day is recommended).   To help with nausea and vomiting, eat small, frequent meals instead of three large meals a day. Choose foods and drinks that are at room temperature. Ask your nurse or doctor about other helpful tips and medicine that is available to help stop or lessen these symptoms.   If you throw up or have loose bowel movements, you should drink more fluids so that you do not become dehydrated (lack of water in the body from losing too much fluid).   If you have diarrhea, eat low-fiber foods that are high in protein and calories and avoid foods that can irritate your digestive tracts or lead to cramping.   Ask your nurse or doctor about medicine that can lessen or stop your diarrhea or constipation.   If you are not able to move your bowels, check with your doctor or nurse before you use enemas, laxatives, or suppositories.   If you get a rash do not put anything on it unless your doctor or nurse says you may. Keep the area around the rash clean and dry. Ask your doctor for medicine if your  rash bothers you.   If you have numbness and tingling in your hands and feet, be careful when cooking, walking, and handling sharp objects and hot liquids.   Infusion reactions may occur after your infusion. If this happens, call 911 for emergency care.   To help with hair loss, wash with a mild shampoo and avoid washing your hair every day.   Avoid rubbing your scalp, pat your hair or scalp dry.   Avoid coloring your hair.   Limit your use of hair spray, electric curlers, blow dryers, and curling irons.   If you are interested in getting a wig, talk to your nurse. You can also call the [employer] at 800-ACS-2345 to find out information about the "Look Good, Feel Better" program close to where you live. It is a free program where women getting chemotherapy can learn about wigs, turbans and scarves as well as makeup techniques and skin and nail care.   Keeping your pain under control is important to your well-being. Please tell your doctor or nurse if you are experiencing pain.   Get regular exercise. If you feel too tired to exercise vigorously, try taking a short walk.  Food and Drug Interactions  There are no known interactions of pertuzumab with food.   This drug may interact with other medicines. Tell your doctor and pharmacist about all the prescription and over-the-counter medicines and dietary supplements (vitamins, minerals, herbs and others) that  you are taking at this time. Also, check with your doctor or pharmacist before starting any new prescription or over-the-counter medicines, or dietary supplements to make sure that there are no interactions.  When to Call the Doctor Call your doctor or nurse if you have any of these symptoms and/or any new or unusual symptoms:   Fever of 100.4 F (38 C) or higher   Chills   Trouble breathing   Tiredness or weakness that interferes with your daily activities   Feeling dizzy or lightheaded   Easy bleeding or  bruising   Swelling of arms, legs, ankles, or feet   Weight gain of 5 pounds in one week (fluid retention)   Headache that does not go away   Nausea that stops you from eating or drinking and/or is not relieved by prescribed medicines   Throwing up   Diarrhea, 4 times in one day or diarrhea with lack of strength or a feeling of being dizzy   No bowel movement in 3 days or when you feel uncomfortable   A new rash or a rash that is not relieved by prescribed medicines   Numbness, tingling, or pain in your hands and feet   Signs of allergic reaction: swelling of the face, feeling like your tongue or throat are swelling, trouble breathing, rash, itching, fever, chills, feeling dizzy, and/or feeling that your heart is beating in a fast or not normal way. If this happens, call 911 for emergency care.   Signs of infusion reaction: fever or shaking chills, flushing, facial swelling, feeling dizzy, headache, trouble breathing, rash, itching, chest tightness, or chest pain. If this happens, call 911 for emergency care.   If you think you may be pregnant  Reproduction Warnings  Pregnancy warning: This drug can have harmful effects on the unborn baby. Women of childbearing potential should use effective methods of birth control during your cancer treatment and for 7 months after treatment. Let your doctor know right away if you think you may be pregnant.   Breastfeeding warning: It is not known if this drug passes into breast milk. For this reason, women should not breastfeed during treatment and for 7 months after treatment because this drug could enter the  breast milk and cause harm to a breastfeeding baby.   Fertility warning: Human fertility studies have not been done with this drug. Talk with your doctor or nurse if you plan to have children. Ask for information on sperm or egg banking.   Trastuzumab-xxxx (Herceptin, [place], [place], [place], [place], [place])  About This  Drug  Trastuzumab-xxxx is used to treat cancer. It is given in the vein (IV) through your port a cath.  The first infusion will be given over 90 minutes.  The second infusion and all subsequent infusions will be given over 30 minutes.  Possible Side Effects  Bone marrow suppression. This is a decrease in the number of white blood cells, red blood cells, and platelets. This may raise your risk of infection, make you tired and weak (fatigue), and raise your risk of bleeding.   Congestive heart failure - your heart has less ability to pump blood properly   Soreness of the mouth and throat. You may have red areas, white patches, or sores that hurt.   Nausea   Diarrhea (loose bowel movements)   Fever   Chills   Tiredness   Infection   Inflammation of nasal passages and throat   Changes in the way food and drinks taste  Weight loss   Headache   Trouble sleeping   Cough   Upper respiratory infection   Rash  Note: Each of the side effects above was reported in 10% or greater of patients treated with trastuzumab-xxxx. Not all possible side effects are included above.  Warnings and Precautions  Changes in the tissue of the heart and heart function. Some changes may happen that can cause your heart to have less ability to pump blood. This drug may also increase your risk of heart attack.    Serious and life-threatening lung problems such as inflammation (swelling) and scarring of the lungs which makes breathing difficult.   While you are getting this drug in your vein (IV), you may have a reaction to the drug. Sometimes you may be given medication to stop or lessen these side effects. Your nurse will check you closely for these signs: fever or shaking chills, flushing, facial swelling, feeling dizzy, headache, trouble breathing, rash, itching, chest tightness, or chest pain. These reactions may happen after your infusion. If this happens, call 911 for emergency care.   Severe  decrease in the number of white blood cells, especially when receiving this drug in combination with other chemotherapy. This may raise your risk of infection which may be lifethreatening.  Note: Some of the side effects above are very rare. If you have concerns and/or questions, please discuss them with your medical team.  Important Information  This drug may be present in the saliva, tears, sweat, urine, stool, vomit, semen, and vaginal secretions. Talk to your doctor and/or your nurse about the necessary precautions to take during this time.  Treating Side Effects  Manage tiredness by pacing your activities for the day.   Be sure to include periods of rest between energy-draining activities.   To decrease the risk of infection, wash your hands regularly.   Avoid close contact with people who have a cold, the flu, or other infections.   Take your temperature as your doctor or nurse tells you, and whenever you feel like you may have a fever.   To help decrease the risk of bleeding, use a soft toothbrush. Check with your nurse before using dental floss.   Be very careful when using knives or tools.   Use an electric shaver instead of a razor.   Drink plenty of fluids (a minimum of eight glasses per day is recommended).   To help with nausea, eat small, frequent meals instead of three large meals a day. Choose foods and drinks that are at room temperature. Ask your nurse or doctor about other helpful tips and medicine that is available to help stop or lessen these symptoms.   Mouth care is very important. Your mouth care should consist of routine, gentle cleaning of your teeth or dentures and rinsing your mouth with a mixture of 1/2 teaspoon of salt in 8 ounces of water or 1/2 teaspoon of baking soda in 8 ounces of water. This should be done at least after each meal and at bedtime.   Taking good care of your mouth may help food taste better and improve your appetite.   If you have  mouth sores, avoid mouthwash that has alcohol. Also avoid alcohol and smoking because they can bother your mouth and throat.   If you throw up or have loose bowel movements, you should drink more fluids so that you do not become dehydrated (lack of water in the body from losing too much fluid).   If you  have diarrhea, eat low-fiber foods that are high in protein and calories and avoid foods that can irritate your digestive tracts or lead to cramping.   Ask your nurse or doctor about medicine that can lessen or stop your diarrhea.   To help with weight loss, drink fluids that contribute calories (whole milk, juice, soft drinks, sweetened beverages, milkshakes, and nutritional supplements) instead of water.   Include a source of protein at every meal and snack, such as meat, poultry, fish, dry beans, tofu, eggs, nuts, milk, yogurt, cheese, ice cream, pudding, and nutritional supplements.   If you get a rash do not put anything on it unless your doctor or nurse says you may. Keep the area around the rash clean and dry. Ask your doctor for medicine if your rash bothers you.   Keeping your pain under control is important to your well-being. Please tell your doctor or nurse if you are experiencing pain.   If you are having trouble sleeping, talk to your nurse or doctor on tips to help you sleep better.   Infusion reactions may occur after your infusion. If this happens, call 911 for emergency care.  Food and Drug Interactions  There are no known interactions of trastuzumab-xxxx with food.   This drug may interact with other medicines. Tell your doctor and pharmacist about all the prescription and over-the-counter medicines and dietary supplements (vitamins, minerals, herbs and others) that you are taking at this time. Also, check with your doctor or pharmacist before starting any new prescription or over-the-counter medicines, or dietary supplements to make sure that there are no  interactions.  When to Call the Doctor Call your doctor or nurse if you have any of these symptoms and/or any new or unusual symptoms:   Fever of 100.4 F (38 C) or higher   Chills   Tiredness that interferes with your daily activities   Trouble falling or staying asleep   Feeling dizzy or lightheaded   A headache that does not go away   Easy bleeding or bruising   Wheezing or trouble breathing or dry cough   Coughing up yellow, green, or bloody mucus   Feeling that your heart is beating in a fast or not normal way (palpitations)   Chest pain or symptoms of a heart attack. Most heart attacks involve pain in the center of the chest that lasts more than a few minutes. The pain may go away and come back, or it can be constant. It can feel like pressure, squeezing, fullness, or pain. Sometimes pain is felt in one or both arms, the back, neck, jaw, or stomach. If any of these symptoms last 2 minutes, call 911.   Pain in your mouth or throat that makes it hard to eat or drink   Nausea that stops you from eating or drinking and/or is not relieved by prescribed medicines   Diarrhea, 4 times in one day or diarrhea with lack of strength or a feeling of being dizzy   Lasting loss of appetite or rapid weight loss of five pounds in a week   Swelling of arms, hand, legs, and/or feet   Weight gain of 5 pounds in one week (fluid retention)   A new rash and/or itching that is not relieved by prescribed medicines   Signs of infusion reaction: fever or shaking chills, flushing, facial swelling, feeling dizzy, headache, trouble breathing, rash, itching, chest tightness, or chest pain. If this happens call 911 for emergency care.   If  you think you may be pregnant  Reproduction Warnings  Pregnancy warning: This drug can have harmful effects on the unborn baby. Women of childbearing potential should use effective methods of birth control during your cancer treatment and for 7 months after  treatment. Let your doctor know right away if you think you may be pregnant during treatment or within 7 months of receiving treatment.   Breastfeeding warning: It is not known if this drug passes into breast milk. For this reason, women should talk to their doctor about the risks and benefits of breastfeeding during treatment with this drug and for 7 months after treatment because this drug may enter the breast milk and cause harm to a breastfeeding baby.   Fertility warning: Human fertility studies have not been done with this drug. Talk with your doctor or nurse if you plan to have children. Ask for information on sperm or egg banking.    SELF CARE ACTIVITIES WHILE ON CHEMOTHERAPY/IMMUNOTHERAPY:  Hydration Increase your fluid intake and drink at least 64 ounces (2 liters) of water/decaffeinated beverages per day after treatment. You can still have your cup of coffee or soda but these beverages do not count as part of the 64 ounces that you need to drink daily. Limit alcohol intake.  Medications Continue taking your normal prescription medication as prescribed.  If you start any new herbal or new supplements please let us know first to make sure it is safe.  Mouth Care Have teeth cleaned professionally before starting treatment. Keep dentures and partial plates clean. Use soft toothbrush and do not use mouthwashes that contain alcohol. Biotene is a good mouthwash that is available at most pharmacies or may be ordered by calling [phone]. Use warm salt water gargles (1 teaspoon salt per 1 quart warm water) before and after meals and at bedtime. If you are still having problems with your mouth or sores in your mouth please call the clinic. If you need dental work, please let the doctor know before you go for your appointment so that we can coordinate the best possible time for you in regards to your chemo regimen. You need to also let your dentist know that you are actively taking chemo. We  may need to do labs prior to your dental appointment.  Skin Care Always use sunscreen that has not expired and with SPF (Sun Protection Factor) of 50 or higher. Wear hats to protect your head from the sun. Remember to use sunscreen on your hands, ears, face, & feet.  Use good moisturizing lotions such as udder cream, eucerin, or even Vaseline. Some chemotherapies can cause dry skin, color changes in your skin and nails.    Avoid long, hot showers or baths. Use gentle, fragrance-free soaps and laundry detergent. Use moisturizers, preferably creams or ointments rather than lotions because the thicker consistency is better at preventing skin dehydration. Apply the cream or ointment within 15 minutes of showering. Reapply moisturizer at night, and moisturize your hands every time after you wash them.   Infection Prevention Please wash your hands for at least 30 seconds using warm soapy water. Handwashing is the #1 way to prevent the spread of germs. Stay away from sick people or people who are getting over a cold. If you develop respiratory systems such as green/yellow mucus production or productive cough or persistent cough let us know and we will see if you need an antibiotic. It is a good idea to keep a pair of gloves on when going  into grocery stores/[employer] to decrease your risk of coming into contact with germs on the carts, etc. Carry alcohol hand gel with you at all times and use it frequently if out in public. If your temperature reaches 100.5 or higher please call the clinic and let us know.  If it is after hours or on the weekend please go to the ER if your temperature is over 100.4.  Please have your own personal thermometer at home to use.    Sex and bodily fluids If you are going to have sex, a condom must be used to protect the person that isn't taking immunotherapy. For a few days after treatment, immunotherapy can be excreted through your bodily fluids.  When using the toilet please close  the lid and flush the toilet twice.  Do this for a few day after you have had immunotherapy.   Contraception It is not known for sure whether or not immunotherapy drugs can be passed on through semen or secretions from the vagina. Because of this some doctors advise people to use a barrier method if you have sex during treatment. This applies to vaginal, anal or oral sex.  Generally, doctors advise a barrier method only for the time you are actually having the treatment and for about a week after your treatment.  Advice like this can be worrying, but this does not mean that you have to avoid being intimate with your partner. You can still have close contact with your partner and continue to enjoy sex.  Animals If you have cats or birds we ask that you not change the litter or change the cage.  Please have someone else do this for you while you are on immunotherapy.   Food Safety During and After Cancer Treatment Food safety is important for people both during and after cancer treatment. Cancer and cancer treatments, such as chemotherapy, radiation therapy, and stem cell/bone marrow transplantation, often weaken the immune system. This makes it harder for your body to protect itself from foodborne illness, also called food poisoning. Foodborne illness is caused by eating food that contains harmful bacteria, parasites, or viruses.  Foods to avoid Some foods have a higher risk of becoming tainted with bacteria. These include: Unwashed fresh fruit and vegetables, especially leafy vegetables that can hide dirt and other contaminants Raw sprouts, such as alfalfa sprouts Raw or undercooked beef, especially ground beef, or other raw or undercooked meat and poultry Fatty, fried, or spicy foods immediately before or after treatment.  These can sit heavy on your stomach and make you feel nauseous. Raw or undercooked shellfish, such as oysters. Sushi and sashimi, which often contain raw fish.   Unpasteurized beverages, such as unpasteurized fruit juices, raw milk, raw yogurt, or cider Undercooked eggs, such as soft boiled, over easy, and poached; raw, unpasteurized eggs; or foods made with raw egg, such as homemade raw cookie dough and homemade mayonnaise  Simple steps for food safety  Shop smart. Do not buy food stored or displayed in an unclean area. Do not buy bruised or damaged fruits or vegetables. Do not buy cans that have cracks, dents, or bulges. Pick up foods that can spoil at the end of your shopping trip and store them in a cooler on the way home.  Prepare and clean up foods carefully. Rinse all fresh fruits and vegetables under running water, and dry them with a clean towel or paper towel. Clean the top of cans before opening them. After preparing food, wash  your hands for 20 seconds with hot water and soap. Pay special attention to areas between fingers and under nails. Clean your utensils and dishes with hot water and soap. Disinfect your kitchen and cutting boards using 1 teaspoon of liquid, unscented bleach mixed into 1 quart of water.    Dispose of old food. Eat canned and packaged food before its expiration date (the "use by" or "best before" date). Consume refrigerated leftovers within 3 to 4 days. After that time, throw out the food. Even if the food does not smell or look spoiled, it still may be unsafe. Some bacteria, such as Listeria, can grow even on foods stored in the refrigerator if they are kept for too long.  Take precautions when eating out. At restaurants, avoid buffets and salad bars where food sits out for a long time and comes in contact with many people. Food can become contaminated when someone with a virus, often a norovirus, or another "bug" handles it. Put any leftover food in a "to-go" container yourself, rather than having the server do it. And, refrigerate leftovers as soon as you get home. Choose restaurants that are clean and that are  willing to prepare your food as you order it cooked.    SYMPTOMS TO REPORT AS SOON AS POSSIBLE AFTER TREATMENT:  FEVER GREATER THAN 100.4 F CHILLS WITH OR WITHOUT FEVER NAUSEA AND VOMITING THAT IS NOT CONTROLLED WITH YOUR NAUSEA MEDICATION UNUSUAL SHORTNESS OF BREATH UNUSUAL BRUISING OR BLEEDING TENDERNESS IN MOUTH AND THROAT WITH OR WITHOUT PRESENCE OF ULCERS URINARY PROBLEMS BOWEL PROBLEMS UNUSUAL RASH     Wear comfortable clothing and clothing appropriate for easy access to any Portacath or PICC line. Let us know if there is anything that we can do to make your therapy better!   What to do if you need assistance after hours or on the weekends: CALL [phone].  HOLD on the line, do not hang up.  You will hear multiple messages but at the end you will be connected with a nurse triage line.  They will contact the doctor if necessary.  Most of the time they will be able to assist you.  Do not call the hospital operator.    I have been informed and understand all of the instructions given to me and have received a copy. I have been instructed to call the clinic [phone] or my family physician as soon as possible for continued medical care, if indicated. I do not have any more questions at this time but understand that I may call the Cancer Center or the Patient Navigator at [phone] during office hours should I have questions or need assistance in obtaining follow-up care.

## 2022-10-03 ENCOUNTER — Inpatient Hospital Stay: Payer: Commercial Managed Care - PPO

## 2022-10-03 ENCOUNTER — Inpatient Hospital Stay: Payer: Commercial Managed Care - PPO | Admitting: Licensed Clinical Social Worker

## 2022-10-03 DIAGNOSIS — Z170 Estrogen receptor positive status [ER+]: Secondary | ICD-10-CM

## 2022-10-03 MED ORDER — LIDOCAINE-PRILOCAINE 2.5-2.5 % EX CREA: TOPICAL_CREAM | CUTANEOUS | 3 refills | Status: AC

## 2022-10-03 MED ORDER — PROCHLORPERAZINE MALEATE 10 MG PO TABS
10.0000 mg | ORAL_TABLET | Freq: Four times a day (QID) | ORAL | 3 refills | Status: DC | PRN
Start: 2022-10-03 — End: 2022-10-31

## 2022-10-03 NOTE — Progress Notes (Signed)
[facility] Clinical Social Work  Initial Assessment   Melissa Rodriguez is a 57 y.o. year old female presenting alone. Clinical Social Work was referred by medical provider for assessment of psychosocial needs.   SDOH (Social Determinants of Health) assessments performed: Yes   SDOH Screenings   Depression (PHQ2-9): Low Risk  (07/11/2022)  Tobacco Use: Low Risk  (10/01/2022)     Distress Screen completed: No     No data to display            Family/Social Information:  Housing Arrangement: patient lives with her husband.  Both pt and her husband were diagnosed with and underwent treatment for cancer in 2008.  Pt w/ breast cancer and her spouse w/ testicular.   Family members/support persons in your life? Pt reports her husband is an excellent source of support and the couple has 2 adult sons and 1 adult daughter who reside locally.  Pt also reports having a number of friends who also provide emotional support as needed.   Transportation concerns: no  Employment: Working part time pt works as a Financial controller.  Income source: Employment Financial concerns: Yes, current concerns Type of concern: Utilities, Rent/ mortgage, and Medical bills Food access concerns: no Religious or spiritual practice: Yes-Protestant Services Currently in place:  none  Coping/ Adjustment to diagnosis: Patient understands treatment plan and what happens next? yes Concerns about diagnosis and/or treatment: Overwhelmed by information Patient reported stressors: Finances Hopes and/or priorities: Pt's priority is to start treatment w/ the hope of positive results. Patient enjoys time with family/ friends Current coping skills/ strengths: Capable of independent living , Motivation for treatment/growth , Physical Health , Religious Affiliation , and Supportive family/friends     SUMMARY: Current SDOH Barriers:  Financial constraints related to loss of income while undergoing treatment and medical  bills.  Clinical Social Work Clinical Goal(s):  Explore community resource options for unmet needs related to:  Financial Strain   Interventions: Discussed common feeling and emotions when being diagnosed with cancer, and the importance of support during treatment Informed patient of the support team roles and support services at [facility] Provided CSW contact information and encouraged patient to call with any questions or concerns Referred patient to Marijean Niemann for supportive services.  Informed of [facility] [facility] and acknowledgment form signed today.  Emailed link for [employer] as well as [employer].   Follow Up Plan: Patient will contact CSW with any support or resource needs Patient verbalizes understanding of plan: Yes    Rachel Moulds, LCSW Clinical Social Worker [facility]

## 2022-10-03 NOTE — Progress Notes (Signed)
Chemotherapy education packet given and discussed with pt in detail.  Discussed diagnosis, staging, tx regimen, and intent of tx.  Reviewed chemotherapy medications and side effects, as well as pre-medications.  Instructed on how to manage side effects at home, and when to call the clinic.  Importance of fever/chills discussed with pt. Discussed precautions to implement at home after receiving tx, as well as self care strategies. Phone numbers provided for clinic during regular working hours, also how to reach the clinic after hours and on weekends. Pt provided the opportunity to ask questions - all questions answered to pt's satisfaction.

## 2022-10-04 NOTE — Progress Notes (Signed)
Patient given presurgical drink and presurgical soap. Education provided and patient verbalized understanding.

## 2022-10-08 NOTE — Anesthesia Preprocedure Evaluation (Signed)
Anesthesia Evaluation  Patient identified by MRN, date of birth, ID band Patient awake    Reviewed: Allergy & Precautions, NPO status , Patient's Chart, lab work & pertinent test results  Airway Mallampati: II  TM Distance: >3 FB Neck ROM: Full    Dental no notable dental hx. (+) Missing, Partial Lower, Partial Upper   Pulmonary neg pulmonary ROS   Pulmonary exam normal breath sounds clear to auscultation       Cardiovascular Normal cardiovascular exam Rhythm:Regular Rate:Normal     Neuro/Psych negative neurological ROS     GI/Hepatic   Endo/Other    Renal/GU      Musculoskeletal   Abdominal   Peds  Hematology   Anesthesia Other Findings All: Hydrocodone, PCN, Cipro   L breast CA  Reproductive/Obstetrics                             Anesthesia Physical Anesthesia Plan  ASA: 2  Anesthesia Plan: General   Post-op Pain Management: Tylenol PO (pre-op)*   Induction: Intravenous  PONV Risk Score and Plan: 3 and Treatment may vary due to age or medical condition, Midazolam and Ondansetron  Airway Management Planned: LMA  Additional Equipment: None  Intra-op Plan:   Post-operative Plan:   Informed Consent: I have reviewed the patients History and Physical, chart, labs and discussed the procedure including the risks, benefits and alternatives for the proposed anesthesia with the patient or authorized representative who has indicated his/her understanding and acceptance.     Dental advisory given  Plan Discussed with: CRNA  Anesthesia Plan Comments: (LMA GA)        Anesthesia Quick Evaluation

## 2022-10-09 ENCOUNTER — Other Ambulatory Visit: Payer: Self-pay

## 2022-10-09 ENCOUNTER — Encounter (HOSPITAL_BASED_OUTPATIENT_CLINIC_OR_DEPARTMENT_OTHER)
Admission: RE | Disposition: A | Discharge status: Home / Self Care | Payer: Self-pay | Source: Ambulatory Visit | Attending: Surgery

## 2022-10-09 ENCOUNTER — Ambulatory Visit (HOSPITAL_COMMUNITY): Payer: Commercial Managed Care - PPO

## 2022-10-09 ENCOUNTER — Ambulatory Visit (HOSPITAL_BASED_OUTPATIENT_CLINIC_OR_DEPARTMENT_OTHER)
Admission: RE | Admit: 2022-10-09 | Discharge: 2022-10-09 | Disposition: A | Discharge status: Home / Self Care | Payer: Commercial Managed Care - PPO | Source: Ambulatory Visit | Attending: Surgery | Admitting: Surgery

## 2022-10-09 ENCOUNTER — Encounter (HOSPITAL_BASED_OUTPATIENT_CLINIC_OR_DEPARTMENT_OTHER): Payer: Self-pay | Admitting: Surgery

## 2022-10-09 ENCOUNTER — Ambulatory Visit (HOSPITAL_BASED_OUTPATIENT_CLINIC_OR_DEPARTMENT_OTHER): Payer: Commercial Managed Care - PPO | Admitting: Certified Registered"

## 2022-10-09 DIAGNOSIS — C50912 Malignant neoplasm of unspecified site of left female breast: Secondary | ICD-10-CM

## 2022-10-09 DIAGNOSIS — Z01818 Encounter for other preprocedural examination: Secondary | ICD-10-CM

## 2022-10-09 DIAGNOSIS — Z9013 Acquired absence of bilateral breasts and nipples: Secondary | ICD-10-CM | POA: Insufficient documentation

## 2022-10-09 DIAGNOSIS — Z170 Estrogen receptor positive status [ER+]: Secondary | ICD-10-CM | POA: Insufficient documentation

## 2022-10-09 HISTORY — PX: PORTACATH PLACEMENT: SHX2246

## 2022-10-09 SURGERY — INSERTION, TUNNELED CENTRAL VENOUS DEVICE, WITH PORT
Anesthesia: General | Site: Chest

## 2022-10-09 MED ORDER — BUPIVACAINE-EPINEPHRINE 0.25% -1:200000 IJ SOLN
INTRAMUSCULAR | Status: DC | PRN
  Administered 2022-10-09: 10 mL

## 2022-10-09 MED ORDER — LIDOCAINE HCL (CARDIAC) PF 100 MG/5ML IV SOSY
PREFILLED_SYRINGE | INTRAVENOUS | Status: DC | PRN
  Administered 2022-10-09: 100 mg via INTRAVENOUS

## 2022-10-09 MED ORDER — ONDANSETRON HCL 4 MG/2ML IJ SOLN
INTRAMUSCULAR | Status: AC
  Filled 2022-10-09: qty 2

## 2022-10-09 MED ORDER — OXYCODONE HCL 5 MG PO TABS: 5.0000 mg | ORAL_TABLET | Freq: Once | ORAL | Status: DC | PRN

## 2022-10-09 MED ORDER — DEXAMETHASONE SODIUM PHOSPHATE 10 MG/ML IJ SOLN
INTRAMUSCULAR | Status: DC | PRN
  Administered 2022-10-09: 5 mg via INTRAVENOUS

## 2022-10-09 MED ORDER — CLINDAMYCIN PHOSPHATE 900 MG/50ML IV SOLN
900.0000 mg | INTRAVENOUS | Status: AC
  Administered 2022-10-09: 900 mg via INTRAVENOUS

## 2022-10-09 MED ORDER — HEPARIN (PORCINE) IN NACL 1000-0.9 UT/500ML-% IV SOLN
INTRAVENOUS | Status: AC
  Filled 2022-10-09: qty 500

## 2022-10-09 MED ORDER — HEPARIN SOD (PORK) LOCK FLUSH 100 UNIT/ML IV SOLN
INTRAVENOUS | Status: DC | PRN
  Administered 2022-10-09: 500 [IU] via INTRAVENOUS

## 2022-10-09 MED ORDER — CHLORHEXIDINE GLUCONATE CLOTH 2 % EX PADS: 6.0000 | MEDICATED_PAD | Freq: Once | CUTANEOUS | Status: DC

## 2022-10-09 MED ORDER — KETOROLAC TROMETHAMINE 30 MG/ML IJ SOLN: 15.0000 mg | Freq: Once | INTRAMUSCULAR | Status: DC | PRN

## 2022-10-09 MED ORDER — PROPOFOL 10 MG/ML IV BOLUS
INTRAVENOUS | Status: AC
  Filled 2022-10-09: qty 20

## 2022-10-09 MED ORDER — DEXAMETHASONE SODIUM PHOSPHATE 10 MG/ML IJ SOLN
INTRAMUSCULAR | Status: AC
  Filled 2022-10-09: qty 1

## 2022-10-09 MED ORDER — MIDAZOLAM HCL 5 MG/5ML IJ SOLN
INTRAMUSCULAR | Status: DC | PRN
  Administered 2022-10-09: 2 mg via INTRAVENOUS

## 2022-10-09 MED ORDER — ONDANSETRON HCL 4 MG/2ML IJ SOLN: 4.0000 mg | Freq: Once | INTRAMUSCULAR | Status: DC | PRN

## 2022-10-09 MED ORDER — ONDANSETRON HCL 4 MG/2ML IJ SOLN
INTRAMUSCULAR | Status: DC | PRN
  Administered 2022-10-09: 4 mg via INTRAVENOUS

## 2022-10-09 MED ORDER — IBUPROFEN 800 MG PO TABS: 800.0000 mg | ORAL_TABLET | Freq: Three times a day (TID) | ORAL | 0 refills | Status: DC | PRN

## 2022-10-09 MED ORDER — OXYCODONE HCL 5 MG PO TABS: 5.0000 mg | ORAL_TABLET | Freq: Four times a day (QID) | ORAL | 0 refills | Status: DC | PRN

## 2022-10-09 MED ORDER — LIDOCAINE 2% (20 MG/ML) 5 ML SYRINGE
INTRAMUSCULAR | Status: AC
  Filled 2022-10-09: qty 5

## 2022-10-09 MED ORDER — GABAPENTIN 300 MG PO CAPS: 300.0000 mg | ORAL_CAPSULE | ORAL | Status: DC

## 2022-10-09 MED ORDER — OXYCODONE HCL 5 MG/5ML PO SOLN: 5.0000 mg | Freq: Once | ORAL | Status: DC | PRN

## 2022-10-09 MED ORDER — CLINDAMYCIN PHOSPHATE 900 MG/50ML IV SOLN
INTRAVENOUS | Status: AC
  Filled 2022-10-09: qty 50

## 2022-10-09 MED ORDER — HEPARIN SOD (PORK) LOCK FLUSH 100 UNIT/ML IV SOLN
INTRAVENOUS | Status: AC
  Filled 2022-10-09: qty 5

## 2022-10-09 MED ORDER — FENTANYL CITRATE (PF) 100 MCG/2ML IJ SOLN
INTRAMUSCULAR | Status: DC | PRN
  Administered 2022-10-09 (×4): 25 ug via INTRAVENOUS

## 2022-10-09 MED ORDER — PROPOFOL 500 MG/50ML IV EMUL
INTRAVENOUS | Status: DC | PRN
  Administered 2022-10-09: 150 ug/kg/min via INTRAVENOUS

## 2022-10-09 MED ORDER — PROPOFOL 10 MG/ML IV BOLUS
INTRAVENOUS | Status: DC | PRN
  Administered 2022-10-09: 110 mg via INTRAVENOUS

## 2022-10-09 MED ORDER — HEPARIN (PORCINE) IN NACL 2-0.9 UNITS/ML
INTRAMUSCULAR | Status: AC | PRN
  Administered 2022-10-09: 500 mL via INTRAVENOUS

## 2022-10-09 MED ORDER — GABAPENTIN 300 MG PO CAPS
ORAL_CAPSULE | ORAL | Status: AC
  Filled 2022-10-09: qty 1

## 2022-10-09 MED ORDER — MIDAZOLAM HCL 2 MG/2ML IJ SOLN
INTRAMUSCULAR | Status: AC
  Filled 2022-10-09: qty 2

## 2022-10-09 MED ORDER — FENTANYL CITRATE (PF) 100 MCG/2ML IJ SOLN
INTRAMUSCULAR | Status: AC
  Filled 2022-10-09: qty 2

## 2022-10-09 MED ORDER — HYDROMORPHONE HCL 1 MG/ML IJ SOLN: 0.2500 mg | INTRAMUSCULAR | Status: DC | PRN

## 2022-10-09 MED ORDER — ACETAMINOPHEN 500 MG PO TABS
ORAL_TABLET | ORAL | Status: AC
  Filled 2022-10-09: qty 2

## 2022-10-09 MED ORDER — ACETAMINOPHEN 500 MG PO TABS: 1000.0000 mg | ORAL_TABLET | Freq: Once | ORAL | Status: DC

## 2022-10-09 MED ORDER — LACTATED RINGERS IV SOLN: INTRAVENOUS | Status: DC

## 2022-10-09 MED ORDER — ACETAMINOPHEN 500 MG PO TABS: 1000.0000 mg | ORAL_TABLET | ORAL | Status: AC

## 2022-10-09 MED ORDER — BUPIVACAINE-EPINEPHRINE (PF) 0.25% -1:200000 IJ SOLN
INTRAMUSCULAR | Status: AC
  Filled 2022-10-09: qty 30

## 2022-10-09 SURGICAL SUPPLY — 60 items
ADH SKN CLS APL DERMABOND .7 (GAUZE/BANDAGES/DRESSINGS) ×1
APL PRP STRL LF DISP 70% ISPRP (MISCELLANEOUS) ×1
APL SKNCLS STERI-STRIP NONHPOA (GAUZE/BANDAGES/DRESSINGS)
BAG DECANTER FOR FLEXI CONT (MISCELLANEOUS) ×1 IMPLANT
BENZOIN TINCTURE PRP APPL 2/3 (GAUZE/BANDAGES/DRESSINGS) IMPLANT
BLADE HEX COATED 2.75 (ELECTRODE) ×1 IMPLANT
BLADE SURG 11 STRL SS (BLADE) ×1 IMPLANT
BLADE SURG 15 STRL LF DISP TIS (BLADE) ×1 IMPLANT
BLADE SURG 15 STRL SS (BLADE) ×1
CANISTER SUCT 1200ML W/VALVE (MISCELLANEOUS) IMPLANT
CHLORAPREP W/TINT 26 (MISCELLANEOUS) ×1 IMPLANT
COVER BACK TABLE 60X90IN (DRAPES) ×1 IMPLANT
COVER MAYO STAND STRL (DRAPES) ×1 IMPLANT
COVER PROBE 5X48 (MISCELLANEOUS) ×1
COVER PROBE CYLINDRICAL 5X96 (MISCELLANEOUS) IMPLANT
DERMABOND ADVANCED .7 DNX12 (GAUZE/BANDAGES/DRESSINGS) ×1 IMPLANT
DRAPE C-ARM 42X72 X-RAY (DRAPES) ×1 IMPLANT
DRAPE LAPAROSCOPIC ABDOMINAL (DRAPES) ×1 IMPLANT
DRAPE UTILITY XL STRL (DRAPES) ×1 IMPLANT
DRSG TEGADERM 2-3/8X2-3/4 SM (GAUZE/BANDAGES/DRESSINGS) IMPLANT
DRSG TEGADERM 4X4.75 (GAUZE/BANDAGES/DRESSINGS) IMPLANT
ELECT REM PT RETURN 9FT ADLT (ELECTROSURGICAL) ×1
ELECTRODE REM PT RTRN 9FT ADLT (ELECTROSURGICAL) ×1 IMPLANT
GAUZE 4X4 16PLY ~~LOC~~+RFID DBL (SPONGE) ×1 IMPLANT
GAUZE SPONGE 4X4 12PLY STRL LF (GAUZE/BANDAGES/DRESSINGS) IMPLANT
GLOVE BIOGEL PI IND STRL 8 (GLOVE) ×1 IMPLANT
GLOVE ECLIPSE 8.0 STRL XLNG CF (GLOVE) ×1 IMPLANT
GOWN STRL REUS W/ TWL LRG LVL3 (GOWN DISPOSABLE) ×2 IMPLANT
GOWN STRL REUS W/ TWL XL LVL3 (GOWN DISPOSABLE) ×1 IMPLANT
GOWN STRL REUS W/TWL LRG LVL3 (GOWN DISPOSABLE) ×2
GOWN STRL REUS W/TWL XL LVL3 (GOWN DISPOSABLE) ×1
IV KIT MINILOC 20X1 SAFETY (NEEDLE) IMPLANT
KIT CVR 48X5XPRB PLUP LF (MISCELLANEOUS) ×1 IMPLANT
KIT PORT INFUSION SMART 8FR (Port) IMPLANT
NDL HYPO 22X1.5 SAFETY MO (MISCELLANEOUS) IMPLANT
NDL HYPO 25X1 1.5 SAFETY (NEEDLE) ×1 IMPLANT
NDL SAFETY ECLIP 18X1.5 (MISCELLANEOUS) IMPLANT
NDL SPNL 22GX3.5 QUINCKE BK (NEEDLE) IMPLANT
NEEDLE HYPO 22X1.5 SAFETY MO (MISCELLANEOUS)
NEEDLE HYPO 25X1 1.5 SAFETY (NEEDLE) ×1
NEEDLE SPNL 22GX3.5 QUINCKE BK (NEEDLE)
PACK BASIN DAY SURGERY FS (CUSTOM PROCEDURE TRAY) ×1 IMPLANT
PENCIL SMOKE EVACUATOR (MISCELLANEOUS) ×1 IMPLANT
SET SHEATH INTRODUCER 10FR (MISCELLANEOUS) IMPLANT
SHEATH COOK PEEL AWAY SET 9F (SHEATH) IMPLANT
SLEEVE SCD COMPRESS KNEE MED (STOCKING) ×1 IMPLANT
SPIKE FLUID TRANSFER (MISCELLANEOUS) IMPLANT
SPONGE T-LAP 4X18 ~~LOC~~+RFID (SPONGE) IMPLANT
STRIP CLOSURE SKIN 1/2X4 (GAUZE/BANDAGES/DRESSINGS) IMPLANT
SUT MON AB 4-0 PC3 18 (SUTURE) ×1 IMPLANT
SUT PROLENE 2 0 CT2 30 (SUTURE) IMPLANT
SUT PROLENE 2 0 SH DA (SUTURE) ×1 IMPLANT
SUT SILK 2 0 TIES 17X18 (SUTURE)
SUT SILK 2-0 18XBRD TIE BLK (SUTURE) IMPLANT
SUT VICRYL 3-0 CR8 SH (SUTURE) ×1 IMPLANT
SYR 5ML LUER SLIP (SYRINGE) ×1 IMPLANT
SYR CONTROL 10ML LL (SYRINGE) ×1 IMPLANT
TOWEL GREEN STERILE FF (TOWEL DISPOSABLE) ×2 IMPLANT
TUBE CONNECTING 20X1/4 (TUBING) IMPLANT
YANKAUER SUCT BULB TIP NO VENT (SUCTIONS) IMPLANT

## 2022-10-09 NOTE — Discharge Instructions (Addendum)
Post Anesthesia Home Care Instructions  Activity: Get plenty of rest for the remainder of the day. A responsible individual must stay with you for 24 hours following the procedure.  For the next 24 hours, DO NOT: -Drive a car -Advertising copywriter -Drink alcoholic beverages -Take any medication unless instructed by your physician -Make any legal decisions or sign important papers.  Meals: Start with liquid foods such as gelatin or soup. Progress to regular foods as tolerated. Avoid greasy, spicy, heavy foods. If nausea and/or vomiting occur, drink only clear liquids until the nausea and/or vomiting subsides. Call your physician if vomiting continues.  Special Instructions/Symptoms: Your throat may feel dry or sore from the anesthesia or the breathing tube placed in your throat during surgery. If this causes discomfort, gargle with warm salt water. The discomfort should disappear within 24 hours.  If you had a scopolamine patch placed behind your ear for the management of post- operative nausea and/or vomiting:  1. The medication in the patch is effective for 72 hours, after which it should be removed.  Wrap patch in a tissue and discard in the trash. Wash hands thoroughly with soap and water. 2. You may remove the patch earlier than 72 hours if you experience unpleasant side effects which may include dry mouth, dizziness or visual disturbances. 3. Avoid touching the patch. Wash your hands with soap and water after contact with the patch.       PORT-A-CATH: POST OP INSTRUCTIONS  Always review your discharge instruction sheet given to you by the facility where your surgery was performed.   A prescription for pain medication may be given to you upon discharge. Take your pain medication as prescribed, if needed. If narcotic pain medicine is not needed, then you make take acetaminophen (Tylenol) or ibuprofen (Advil) as needed.  Take your usually prescribed medications unless otherwise  directed. If you need a refill on your pain medication, please contact our office. All narcotic pain medicine now requires a paper prescription.  Phoned in and fax refills are no longer allowed by law.  Prescriptions will not be filled after 5 pm or on weekends.  You should follow a light diet for the remainder of the day after your procedure. Most patients will experience some mild swelling and/or bruising in the area of the incision. It may take several days to resolve. It is common to experience some constipation if taking pain medication after surgery. Increasing fluid intake and taking a stool softener (such as Colace) will usually help or prevent this problem from occurring. A mild laxative (Milk of Magnesia or Miralax) should be taken according to package directions if there are no bowel movements after 48 hours.  Unless discharge instructions indicate otherwise, you may remove your bandages 48 hours after surgery, and you may shower at that time. You may have steri-strips (small white skin tapes) in place directly over the incision.  These strips should be left on the skin for 7-10 days.  If your surgeon used Dermabond (skin glue) on the incision, you may shower in 24 hours.  The glue will flake off over the next 2-3 weeks.  If your port is left accessed at the end of surgery (needle left in port), the dressing cannot get wet and should only by changed by a healthcare professional. When the port is no longer accessed (when the needle has been removed), follow step 7.   ACTIVITIES:  Limit activity involving your arms for the next 72 hours. Do no strenuous  exercise or activity for 1 week. You may drive when you are no longer taking prescription pain medication, you can comfortably wear a seatbelt, and you can maneuver your car. 10.You may need to see your doctor in the office for a follow-up appointment.  Please       check with your doctor.  11.When you receive a new Port-a-Cath, you will get a  product guide and        ID card.  Please keep them in case you need them.  WHEN TO CALL YOUR DOCTOR [phone]): Fever over 101.0 Chills Continued bleeding from incision Increased redness and tenderness at the site Shortness of breath, difficulty breathing   The clinic staff is available to answer your questions during regular business hours. Please don't hesitate to call and ask to speak to one of the nurses or medical assistants for clinical concerns. If you have a medical emergency, go to the nearest emergency room or call 911.  A surgeon from [facility] is always on call at the hospital.     For further information, please visit [email or web address]

## 2022-10-09 NOTE — Op Note (Signed)
Preoperative diagnosis: PAC needed for chemotherapy   Postoperative diagnosis: Same  Procedure: Portacath Placement with C arm and U/S guidance   Surgeon: Dortha Schwalbe, MD, FACS  Anesthesia: General and 0.25 % marcaine with epinephrine  Clinical History and Indications: The patient is getting ready to begin chemotherapy for her cancer. She  needs a Port-A-Cath for venous access. Risk of bleeding, infection,  Collapse lung,  Death,  DVT,  Organ injury,  Mediastinal injury,  Injury to heart,  Injury to blood vessels,  Nerves,  Migration of catheter,  Embolization of catheter and the need for more surgery.  Description of Procedure: I have seen the patient in the holding area and confirmed the plans for the procedure as noted above. I reviewed the risks and complications again and the patient has no further questions. She wishes to proceed.   The patient was then taken to the operating room. After satisfactory general  anesthesia had been obtained the upper chest and lower neck were prepped and draped as a sterile field. The timeout was done.  The right internal jugular vein  was entered under U/S guidance  and the guidewire threaded into the superior vena cava right atrial area under fluoroscopic guidance. An incision was then made on the anterior chest wall and a subcutaneous pocket fashioned for the port reservoir.  The port tubing was then brought through a subcutaneous tunnel from the port site to the guidewire site.  The port and catheter were attached, locked  and flushed. The catheter was measured and cut to appropriate length.The dilator and peel-away sheath were then advanced over the guidewire while monitoring this with fluoroscopy. The guidewire and dilator were removed and the tubing threaded to approximately 21 cm. The peel-away sheath was then removed. The catheter aspirated and flushed easily. Using fluoroscopy the tip was in the superior vena cava right atrial junction area. It  aspirated and flushed easily. That aspirated and flushed easily.  The reservoir was secured to the fascia with 1 sutures of 2-0 Prolene. A final check with fluoroscopy was done to make sure we had no kinks and good positioning of the tip of the catheter. Everything appeared to be okay. The catheter was aspirated, flushed with dilute heparin and then concentrated aqueous heparin.  The incision was then closed with interrupted 3-0 Vicryl, and 4-0 Monocryl subcuticular with Dermabond on the skin.  There were no operative complications. Estimated blood loss was minimal. All counts were correct. The patient tolerated the procedure well.  Dortha Schwalbe, MD, FACS

## 2022-10-09 NOTE — Transfer of Care (Signed)
Immediate Anesthesia Transfer of Care Note  Patient: Melissa Rodriguez  Procedure(s) Performed: INSERTION PORT-A-CATH (Chest)  Patient Location: PACU  Anesthesia Type:General  Level of Consciousness: drowsy  Airway & Oxygen Therapy: Patient Spontanous Breathing and Patient connected to face mask oxygen  Post-op Assessment: Report given to RN and Post -op Vital signs reviewed and stable  Post vital signs: Reviewed and stable  Last Vitals:  Vitals Value Taken Time  BP 130/59 10/09/22 1100  Temp    Pulse 89 10/09/22 1100  Resp 13 10/09/22 1100  SpO2 100 % 10/09/22 1100  Vitals shown include unfiled device data.  Last Pain:  Vitals:   10/09/22 0838  TempSrc: Temporal  PainSc: 0-No pain         Complications: No notable events documented.

## 2022-10-09 NOTE — Anesthesia Procedure Notes (Signed)
Procedure Name: LMA Insertion Date/Time: 10/09/2022 10:05 AM  Performed by: Lauralyn Primes, CRNAPre-anesthesia Checklist: Patient identified, Emergency Drugs available, Suction available and Patient being monitored Patient Re-evaluated:Patient Re-evaluated prior to induction Oxygen Delivery Method: Circle system utilized Preoxygenation: Pre-oxygenation with 100% oxygen Induction Type: IV induction Ventilation: Mask ventilation without difficulty LMA: LMA inserted LMA Size: 3.0 Number of attempts: 1 Airway Equipment and Method: Bite block Placement Confirmation: positive ETCO2 Tube secured with: Tape Dental Injury: Teeth and Oropharynx as per pre-operative assessment

## 2022-10-09 NOTE — Anesthesia Postprocedure Evaluation (Signed)
Anesthesia Post Note  Patient: Melissa Rodriguez  Procedure(s) Performed: INSERTION PORT-A-CATH (Chest)     Patient location during evaluation: PACU Anesthesia Type: General Level of consciousness: awake and alert Pain management: pain level controlled Vital Signs Assessment: post-procedure vital signs reviewed and stable Respiratory status: spontaneous breathing, nonlabored ventilation, respiratory function stable and patient connected to nasal cannula oxygen Cardiovascular status: blood pressure returned to baseline and stable Postop Assessment: no apparent nausea or vomiting Anesthetic complications: no   No notable events documented.  Last Vitals:  Vitals:   10/09/22 1149 10/09/22 1225  BP: 119/61 (!) 128/98  Pulse: 78 86  Resp: 16 18  Temp:  36.7 C  SpO2: 96% 96%    Last Pain:  Vitals:   10/09/22 1225  TempSrc:   PainSc: 0-No pain   Pain Goal:                   Trevor Iha

## 2022-10-09 NOTE — Interval H&P Note (Signed)
History and Physical Interval Note:  10/09/2022 9:31 AM  Melissa Rodriguez  has presented today for surgery, with the diagnosis of breast cancer.  The various methods of treatment have been discussed with the patient and family. After consideration of risks, benefits and other options for treatment, the patient has consented to  Procedure(s): INSERTION PORT-A-CATH (N/A) as a surgical intervention.  The patient's history has been reviewed, patient examined, no change in status, stable for surgery.  I have reviewed the patient's chart and labs.  Questions were answered to the patient's satisfaction.     Keifer Habib A Imaan Padgett

## 2022-10-09 NOTE — H&P (Signed)
Chief Complaint: Post Operative Visit   History of Present Illness: Melissa Rodriguez is a 57 y.o. female who is seen today for post op check 5 days s/p bilateral mastectomies for breast cancer . Output on each side is less than 30 cc    Review of Systems: A complete review of systems was obtained from the patient. I have reviewed this information and discussed as appropriate with the patient. See HPI as well for other ROS.    Medical History: Past Medical History:  Diagnosis Date  Anemia  GERD (gastroesophageal reflux disease)  History of cancer   There is no problem list on file for this patient.  Past Surgical History:  Procedure Laterality Date  MASTECTOMY PARTIAL / LUMPECTOMY 2008    Allergies  Allergen Reactions  Diphenhydramine Hcl Hallucination and Anxiety  Jittery and Hallucinations  Made her feel like she was crawling the walls.  Hydrocodone Shortness Of Breath  Felt like she could not breathe-February 2020  Ciprofloxacin Hives  Penicillins Hives and Rash  Any medicine related to PCN  Did it involve swelling of the face/tongue/throat, SOB, or low BP? No Did it involve sudden or severe rash/hives, skin peeling, or any reaction on the inside of your mouth or nose? No Did you need to seek medical attention at a hospital or doctor's office? No When did it last happen?  If all above answers are "NO", may proceed with cephalosporin use.   Current Outpatient Medications on File Prior to Visit  Medication Sig Dispense Refill  calcium carbonate (TUMS) 200 mg calcium (500 mg) chewable tablet Take 1 tablet by mouth once daily  cholecalciferol (VITAMIN D3) 1000 unit capsule Take 1,000 Units by mouth once daily  grape seed xt/bioflavon,citrus (GRAPE SEED XT-BIOFLAV,CITRUS) 50-250 mg Cap Take by mouth   No current facility-administered medications on file prior to visit.   Family History  Problem Relation Age of Onset  High blood pressure (Hypertension) Mother   Hyperlipidemia (Elevated cholesterol) Mother  High blood pressure (Hypertension) Father  Hyperlipidemia (Elevated cholesterol) Father  Coronary Artery Disease (Blocked arteries around heart) Father    Social History   Tobacco Use  Smoking Status Never  Smokeless Tobacco Never    Social History   Socioeconomic History  Marital status: Married  Tobacco Use  Smoking status: Never  Smokeless tobacco: Never  Substance and Sexual Activity  Alcohol use: Never  Drug use: Never   Objective:   There were no vitals filed for this visit.  There is no height or weight on file to calculate BMI.  Incisions CDI Drains removed   Labs, Imaging and Diagnostic Testing:  SURGICAL PATHOLOGY CASE: [id number] PATIENT: Melissa Rodriguez Surgical Pathology Report  Clinical History: left breast cancer (cm)  FINAL MICROSCOPIC DIAGNOSIS:  A. BREAST, RIGHT, MASTECTOMY: - Unremarkable skin, nipple and benign breast tissue with fibrocystic change, adenosis, usual ductal hyperplasia, apocrine metaplasia, fibroadenomatoid change, fibroadenoma and a complex sclerosing lesion/radial scar (4 mm in greatest linear dimension). - Biopsy site changes present.  B. BREAST, LEFT, MASTECTOMY WITH SENTINEL LYMPH NODE: Invasive ductal carcinoma x 2, main mass 24 x 23 x 16 mm, grade III/III; incidental mass 10 mm in greatest dimension, grade II/III Ductal carcinoma in situ: Grade 3 DCIS is present associated at the main mass; grade 2 DCIS is present in the incidental mass; there is also incidental grade 2 DCIS identified in the random upper outer and inner quadrants Margins, invasive: Main mass-1 mm from deep margin; incidental mass positive at the posterolateral  anterior margin Margins, DCIS: 1 mm Closest, DCIS: Deep and posterolateral anterior margins Lymphovascular invasion: Not identified Perineural invasion: Associated with incidental mass Prognostic markers (main mass; APS [id number]): ER positive,  PR positive, Her2 positive, Ki-67 35% Other: It is noted that a sentinel lymph node was submitted. The sentinel lymph node was not identified grossly; however, the incidental tumor present at the posterolateral anterior margin could represent the surgically suspected lymph node; surgical correlation necessary. See oncology table  ONCOLOGY TABLE:  INVASIVE CARCINOMA OF THE BREAST: Resection  Procedure: Mastectomy Specimen Laterality: Left Histologic Type: Invasive ductal carcinoma, NOS/invasive mammary carcinoma, NST x 2 (main mass at ribbon clip with an incidental 1 cm mass found at the lateral periphery) Histologic Grade: Glandular (Acinar)/Tubular Differentiation: 3/3 (main mass); 3/3 (incidental tumor) Nuclear Pleomorphism: 3/3 (main mass; 2/3 (incidental tumor) Mitotic Rate: 2/3 (main mass); 1/3 (incidental tumor) Overall Grade: III/III (main mass); II/III (incidental tumor Tumor Size: 24 x 23 x 16 mm (main mass); 10 mm in greatest linear dimension (incidental tumor) Ductal Carcinoma In Situ: Solid type with comedonecrosis, nuclear grade 3 of 3 (associated with main mass); solid and cribriform type, nuclear grade 2 of 3 (associated with incidental tumor) in addition there are scattered foci of DCIS, solid and cribriform type (nuclear grade 2 of 3 present in randomly taken tissue from both the upper outer and upper inner quadrants Lymphatic and/or Vascular Invasion: Not identified Perineural invasion: Associated with incidental tumor Treatment Effect in the Breast: No known presurgical therapy Margins: Margins negative (main mass); margin positive incidental tumor Distance from Closest Margin (mm): 0.1 mm (main mass); positive on incidental mass Specify Closest Margin (required only if <21mm): Deep (main mass); present inked margin which represents the posterolateral anterior junction DCIS Margins: Uninvolved by DCIS Distance from Closest Margin (mm): 0.1 mm for both main  mass and incidental mass Specify Closest Margin (required only if <23mm): Deep and posterolateral anterior junction Regional Lymph Nodes: No lymph node was identified (it is noted that a sentinel lymph node was submitted; this was not identified-the incidental tumor could represent the surgically suspected node; however, there is no lymphoid tissue present in this appears to be an incidental new primary; surgical correlation necessary Number of Lymph Nodes Examined: 0 Number of Sentinel Nodes Examined: 0 Number of Lymph Nodes with Macrometastases (>2 mm): N/A Number of Lymph Nodes with Micrometastases: N/A Number of Lymph Nodes with Isolated Tumor Cells (=0.2 mm or =200 cells): N/A Size of Largest Metastatic Deposit (mm): N/A Extranodal Extension: N/A Distant Metastasis: Distant Site(s) Involved: N/A Breast Biomarker Testing Performed on Previous Biopsy: Testing Performed on Case Number: [id number] Estrogen Receptor: Positive, 99% moderate to strong Progesterone Receptor: Positive, 85% moderate to strong HER2: Positive by FISH Ki-67: 35% Pathologic Stage Classification (pTNM, AJCC 8th Edition): mpT2, pN X Representative Tumor Block: B3 (main mass); B8 (incidental mass) Comment(s): None (v4.5.0.0)   Assessment and Plan:   Diagnoses and all orders for this visit:  Post-operative state  Needs chemotherapy   Port placement    The procedure has been discussed with the patient.  Alternative therapies have been discussed with the patient.  Operative risks include bleeding,  Infection,  Organ injury,  Nerve injury,  Blood vessel injury,collapse lung, mediastinal injury , catheter malfunction,   DVT,  Pulmonary embolism,  Death,  And possible reoperation.  Medical management risks include worsening of present situation.  The success of the procedure is 50 -90 % at treating patients symptoms.  The patient understands  and agrees to proceed.

## 2022-10-10 ENCOUNTER — Encounter (HOSPITAL_BASED_OUTPATIENT_CLINIC_OR_DEPARTMENT_OTHER): Payer: Self-pay | Admitting: Surgery

## 2022-10-11 ENCOUNTER — Encounter: Payer: Self-pay | Admitting: Hematology

## 2022-10-14 ENCOUNTER — Inpatient Hospital Stay: Payer: Commercial Managed Care - PPO

## 2022-10-14 ENCOUNTER — Inpatient Hospital Stay: Payer: Commercial Managed Care - PPO | Admitting: Dietician

## 2022-10-14 VITALS — BP 146/78 | HR 92 | Temp 97.7°F | Resp 16

## 2022-10-14 DIAGNOSIS — Z170 Estrogen receptor positive status [ER+]: Secondary | ICD-10-CM

## 2022-10-14 DIAGNOSIS — C50412 Malignant neoplasm of upper-outer quadrant of left female breast: Secondary | ICD-10-CM | POA: Diagnosis not present

## 2022-10-14 LAB — COMPREHENSIVE METABOLIC PANEL
ALT: 11 U/L (ref 0–44)
AST: 14 U/L — ABNORMAL LOW (ref 15–41)
Albumin: 4 g/dL (ref 3.5–5.0)
Alkaline Phosphatase: 49 U/L (ref 38–126)
Anion gap: 12 (ref 5–15)
BUN: 15 mg/dL (ref 6–20)
CO2: 25 mmol/L (ref 22–32)
Calcium: 9.3 mg/dL (ref 8.9–10.3)
Chloride: 100 mmol/L (ref 98–111)
Creatinine, Ser: 0.64 mg/dL (ref 0.44–1.00)
GFR, Estimated: >60 mL/min (ref >60)
Glucose, Bld: 106 mg/dL — ABNORMAL HIGH (ref 70–99)
Potassium: 3.6 mmol/L (ref 3.5–5.1)
Sodium: 137 mmol/L (ref 135–145)
Total Bilirubin: 0.5 mg/dL (ref 0.3–1.2)
Total Protein: 6.5 g/dL (ref 6.5–8.1)

## 2022-10-14 LAB — CBC WITH DIFFERENTIAL/PLATELET
Abs Immature Granulocytes: 0.03 10*3/uL (ref 0.00–0.07)
Basophils Absolute: 0 10*3/uL (ref 0.0–0.1)
Basophils Relative: 0 %
Eosinophils Absolute: 0.1 10*3/uL (ref 0.0–0.5)
Eosinophils Relative: 1 %
HCT: 42 % (ref 36.0–46.0)
Hemoglobin: 13.5 g/dL (ref 12.0–15.0)
Immature Granulocytes: 0 %
Lymphocytes Relative: 17 %
Lymphs Abs: 1.2 10*3/uL (ref 0.7–4.0)
MCH: 28.5 pg (ref 26.0–34.0)
MCHC: 32.1 g/dL (ref 30.0–36.0)
MCV: 88.6 fL (ref 80.0–100.0)
Monocytes Absolute: 0.5 10*3/uL (ref 0.1–1.0)
Monocytes Relative: 6 %
Neutro Abs: 5.5 10*3/uL (ref 1.7–7.7)
Neutrophils Relative %: 76 %
Platelets: 157 10*3/uL (ref 150–400)
RBC: 4.74 MIL/uL (ref 3.87–5.11)
RDW: 13.1 % (ref 11.5–15.5)
WBC: 7.3 10*3/uL (ref 4.0–10.5)
nRBC: 0 % (ref 0.0–0.2)

## 2022-10-14 LAB — MAGNESIUM: Magnesium: 2.1 mg/dL (ref 1.7–2.4)

## 2022-10-14 MED ORDER — PALONOSETRON HCL INJECTION 0.25 MG/5ML
0.2500 mg | Freq: Once | INTRAVENOUS | Status: AC
  Administered 2022-10-14: 0.25 mg via INTRAVENOUS
  Filled 2022-10-14: qty 5

## 2022-10-14 MED ORDER — SODIUM CHLORIDE 0.9 % IV SOLN
10.0000 mg | Freq: Once | INTRAVENOUS | Status: AC
  Administered 2022-10-14: 10 mg via INTRAVENOUS
  Filled 2022-10-14: qty 1

## 2022-10-14 MED ORDER — METHYLPREDNISOLONE SODIUM SUCC 125 MG IJ SOLR
125.0000 mg | Freq: Once | INTRAMUSCULAR | Status: AC | PRN
  Administered 2022-10-14: 125 mg via INTRAVENOUS

## 2022-10-14 MED ORDER — HEPARIN SOD (PORK) LOCK FLUSH 100 UNIT/ML IV SOLN
500.0000 [IU] | Freq: Once | INTRAVENOUS | Status: AC | PRN
  Administered 2022-10-14: 500 [IU]

## 2022-10-14 MED ORDER — LORATADINE 10 MG PO TABS
10.0000 mg | ORAL_TABLET | Freq: Once | ORAL | Status: AC
  Administered 2022-10-14: 10 mg via ORAL
  Filled 2022-10-14: qty 1

## 2022-10-14 MED ORDER — TRASTUZUMAB-ANNS CHEMO 150 MG IV SOLR
8.0000 mg/kg | Freq: Once | INTRAVENOUS | Status: AC
  Administered 2022-10-14: 357 mg via INTRAVENOUS
  Filled 2022-10-14: qty 17

## 2022-10-14 MED ORDER — METHYLPREDNISOLONE SODIUM SUCC 125 MG IJ SOLR
125.0000 mg | Freq: Once | INTRAMUSCULAR | Status: AC
  Administered 2022-10-14: 125 mg via INTRAVENOUS

## 2022-10-14 MED ORDER — SODIUM CHLORIDE 0.9 % IV SOLN: Freq: Once | INTRAVENOUS | Status: AC

## 2022-10-14 MED ORDER — SODIUM CHLORIDE 0.9 % IV SOLN
150.0000 mg | Freq: Once | INTRAVENOUS | Status: AC
  Administered 2022-10-14: 150 mg via INTRAVENOUS
  Filled 2022-10-14: qty 5

## 2022-10-14 MED ORDER — SODIUM CHLORIDE 0.9% FLUSH
10.0000 mL | INTRAVENOUS | Status: DC | PRN
  Administered 2022-10-14: 10 mL

## 2022-10-14 MED ORDER — ACETAMINOPHEN 325 MG PO TABS
650.0000 mg | ORAL_TABLET | Freq: Once | ORAL | Status: AC
  Administered 2022-10-14: 650 mg via ORAL
  Filled 2022-10-14: qty 2

## 2022-10-14 MED ORDER — CETIRIZINE HCL 10 MG/ML IV SOLN: 10.0000 mg | Freq: Once | INTRAVENOUS | Status: DC

## 2022-10-14 MED ORDER — SODIUM CHLORIDE 0.9% FLUSH
10.0000 mL | Freq: Once | INTRAVENOUS | Status: AC
  Administered 2022-10-14: 10 mL via INTRAVENOUS

## 2022-10-14 MED ORDER — SODIUM CHLORIDE 0.9 % IV SOLN
840.0000 mg | Freq: Once | INTRAVENOUS | Status: AC
  Administered 2022-10-14: 840 mg via INTRAVENOUS
  Filled 2022-10-14: qty 28

## 2022-10-14 MED ORDER — SODIUM CHLORIDE 0.9 % IV SOLN
50.0000 mg/m2 | Freq: Once | INTRAVENOUS | Status: AC
  Administered 2022-10-14: 70 mg via INTRAVENOUS
  Filled 2022-10-14: qty 7

## 2022-10-14 MED ORDER — FAMOTIDINE IN NACL 20-0.9 MG/50ML-% IV SOLN
20.0000 mg | Freq: Once | INTRAVENOUS | Status: AC | PRN
  Administered 2022-10-14: 20 mg via INTRAVENOUS

## 2022-10-14 MED ORDER — SODIUM CHLORIDE 0.9 % IV SOLN
316.4000 mg | Freq: Once | INTRAVENOUS | Status: AC
  Administered 2022-10-14: 320 mg via INTRAVENOUS
  Filled 2022-10-14: qty 32

## 2022-10-14 NOTE — Patient Instructions (Signed)
[facility]  Discharge Instructions: Thank you for choosing [facility] to provide your oncology and hematology care.  If you have a lab appointment with the Cancer Center - please note that after April 8th, 2024, all labs will be drawn in the cancer center.  You do not have to check in or register with the main entrance as you have in the past but will complete your check-in in the cancer center.  Wear comfortable clothing and clothing appropriate for easy access to any Portacath or PICC line.   We strive to give you quality time with your provider. You may need to reschedule your appointment if you arrive late (15 or more minutes).  Arriving late affects you and other patients whose appointments are after yours.  Also, if you miss three or more appointments without notifying the office, you may be dismissed from the clinic at the provider's discretion.      For prescription refill requests, have your pharmacy contact our office and allow 72 hours for refills to be completed.    Today you received the following chemotherapy and/or immunotherapy agents TCHP      To help prevent nausea and vomiting after your treatment, we encourage you to take your nausea medication as directed.  BELOW ARE SYMPTOMS THAT SHOULD BE REPORTED IMMEDIATELY: *FEVER GREATER THAN 100.4 F (38 C) OR HIGHER *CHILLS OR SWEATING *NAUSEA AND VOMITING THAT IS NOT CONTROLLED WITH YOUR NAUSEA MEDICATION *UNUSUAL SHORTNESS OF BREATH *UNUSUAL BRUISING OR BLEEDING *URINARY PROBLEMS (pain or burning when urinating, or frequent urination) *BOWEL PROBLEMS (unusual diarrhea, constipation, pain near the anus) TENDERNESS IN MOUTH AND THROAT WITH OR WITHOUT PRESENCE OF ULCERS (sore throat, sores in mouth, or a toothache) UNUSUAL RASH, SWELLING OR PAIN  UNUSUAL VAGINAL DISCHARGE OR ITCHING   Items with * indicate a potential emergency and should be followed up as soon as possible or go to the  Emergency Department if any problems should occur.  Please show the CHEMOTHERAPY ALERT CARD or IMMUNOTHERAPY ALERT CARD at check-in to the Emergency Department and triage nurse.  Should you have questions after your visit or need to cancel or reschedule your appointment, please contact [facility] CENTER AT [facility] [phone]  and follow the prompts.  Office hours are 8:00 a.m. to 4:30 p.m. Monday - Friday. Please note that voicemails left after 4:00 p.m. may not be returned until the following business day.  We are closed weekends and major holidays. You have access to a nurse at all times for urgent questions. Please call the main number to the clinic [phone] and follow the prompts.  For any non-urgent questions, you may also contact your provider using MyChart. We now offer e-Visits for anyone 85 and older to request care online for non-urgent symptoms. For details visit [email or web address].   Also download the MyChart app! Go to the app store, search "MyChart", open the app, select [employer], and log in with your MyChart username and password.

## 2022-10-14 NOTE — Progress Notes (Signed)
Nutrition Assessment   Reason for Assessment: New pt (underweight)    ASSESSMENT: 57 year old female with stage I IDC of left breast. She is receiving reduce dose TCHP q21d (first 9/30). Patient is under the care of Dr. Ellin Saba.   Past medical history includes left breast cancer s/p lumpectomy + RT (2008), IDA  Met with patient in infusion. She is playing a game of cards with her husband at visit. Pt reports good appetite. Says she eats 3 good meals and snacks in between. Recalls cereal bar followed by bowl of cheesy grits for breakfast, grilled cheese for lunch, meat, veggies, starch at dinner. Last night she ate 2 slices of pizza. She snacks on peanut butter crackers, cottage cheese, sliced cheese. Pt reports she has always struggled to gain weight. States she weighed 86 lb at high school graduation. Patient reports stomach is sensitive. She is unable to tolerate dairy other than cheese. Patient unable to eat raw fruits and vegetables. This gives her diarrhea. Patient tried Ensure years ago. She did not like the taste.   Nutrition Focused Physical Exam: deferred    Medications: vit D, oxycodone, compazine   Labs: reviewed    Anthropometrics: Weights have decreased 3% from 96 lb 3.2 oz on 9/4 - concerning   Height: 5'3" Weight: 93 lb 9.6 oz UBW: 97 lb  BMI: 16.58   Estimated Energy Needs  Kcals: 1700-1900 Protein: 68-77 Fluid: >/= 1.3 L   NUTRITION DIAGNOSIS: Unintended wt loss related to cancer as evidenced by 3% wt loss in 3 weeks which is concerning given pt is underweight at baseline    INTERVENTION:  Continue eating 3 meals plus 3 snacks with adequate calories and protein - high calorie high protein snack ideas provided Discussed Ensure as suitable for those with lactose intolerance - suggested trying half and increasing if tolerated - samples of Ensure + Ensure Clear  Educated on Fairlife milk (lactose free, 1 g sugar, 13 g protein) as alternate option mixed with  CIB Lactose free, dairy free shake recipes provided   MONITORING, EVALUATION, GOAL: Patient will tolerate increased calories and protein to promote wt gain   Next Visit: To be scheduled in collaboration with upcoming [facility] appointments

## 2022-10-14 NOTE — Progress Notes (Signed)
Patients port flushed without difficulty.  Good blood return noted with no bruising or swelling noted at site.  Patient has a fine red rash around the port.  MD notified.  Okay to proceed with labs.  Stable during access and blood draw,  Patient to remain accessed for treatment.

## 2022-10-14 NOTE — Progress Notes (Signed)
Patient presents today for TCHP infusion per providers order.  Vital signs and labs within parameters for treatment.  Patient has no new complaints at this time.  Hypersensitivity Reaction note  Date of event: 10/14/22 Time of event: 1035 Generic name of drug involved: trastuzumab-anns Name of provider notified of the hypersensitivity reaction: Ellin Saba Was agent that likely caused hypersensitivity reaction added to Allergies List within EMR? yes Chain of events including reaction signs/symptoms, treatment administered, and outcome (e.g., drug resumed; drug discontinued; sent to Emergency Department; etc.)  1035 patient called out and complains of hot tongue and lip tingling 1036 saline hanging, MD notified 1040 vital sign WNL, c/o dizziness/both lips tingling/itchy weird feeling throat 1048 125 mg IV Solumedrol and 20 mg IV Pepcid given per MD. 1101 symptoms lessening  1104 vitals WNL 1108 per MD may restart trastuzumab-anns after symptoms resolve completely 1125 symptoms resolved. 1129 restarted infusion   Worthy Rancher, RN 10/14/2022 1:40 PM  Hypersensitivity Reaction note  Date of event: 10/14/22 Time of event: 1158 Generic name of drug involved: docetaxel Name of provider notified of the hypersensitivity reaction: Ellin Saba Was agent that likely caused hypersensitivity reaction added to Allergies List within EMR? yes Chain of events including reaction signs/symptoms, treatment administered, and outcome (e.g., drug resumed; drug discontinued; sent to Emergency Department; etc.)  1558 c/o tingling in lower lip and pain in her teeth, infusion stopped, saline infusing, MD notified 1600 tongue feels hot, both lips tingling, teeth hurt 1603 saline infusing, no change in symptoms 1612 125 mg Solumedrol given per MD 1616 symptoms lessening 1620 symptoms resolved 1624 restarted infusion per MD   Worthy Rancher, RN 10/14/2022 4:42 PM  Treatment given today per  MD orders.  Stable during infusion without adverse affects.  Vital signs stable.  No complaints at this time.  Discharge from clinic ambulatory in stable condition.  Alert and oriented X 3.  Follow up with [facility] as scheduled.

## 2022-10-14 NOTE — Progress Notes (Signed)
Patient reports issues with antihistamines and tolerates loratidine the best.  Premeds updated to loratidine 10 mg po x 1 prior to trastuzumab.  T.O. Dr Carilyn Goodpasture, PharmD

## 2022-10-14 NOTE — Progress Notes (Signed)
Pharmacist Chemotherapy Monitoring - Initial Assessment    Anticipated start date: 10/14/22   The following has been reviewed per standard work regarding the patient's treatment regimen: The patient's diagnosis, treatment plan and drug doses, and organ/hematologic function Lab orders and baseline tests specific to treatment regimen  The treatment plan start date, drug sequencing, and pre-medications Prior authorization status  Patient's documented medication list, including drug-drug interaction screen and prescriptions for anti-emetics and supportive care specific to the treatment regimen The drug concentrations, fluid compatibility, administration routes, and timing of the medications to be used The patient's access for treatment and lifetime cumulative dose history, if applicable  The patient's medication allergies and previous infusion related reactions, if applicable   Changes made to treatment plan:  N/A  Follow up needed:  N/A   Stephens Shire, [facility], 10/14/2022  8:47 AM

## 2022-10-15 ENCOUNTER — Encounter: Payer: Self-pay | Admitting: Hematology

## 2022-10-15 ENCOUNTER — Telehealth: Payer: Self-pay | Admitting: Hematology

## 2022-10-15 NOTE — Telephone Encounter (Signed)
Pt enrolled into the [employer] fund

## 2022-10-15 NOTE — Progress Notes (Signed)
24 hour call back: Patient denies any nausea, vomiting, or diarrhea. Patient states her legs felt wobbly per patient's words. Patient eating and drinking well and no issues with appetite at this time. Patient denies any rashes, shortness of breath, itching or pain. Patient teaching pertaining to Udenyca injection. Patient has no complaints at this time.

## 2022-10-16 ENCOUNTER — Ambulatory Visit: Payer: Commercial Managed Care - PPO | Admitting: Family Medicine

## 2022-10-16 ENCOUNTER — Inpatient Hospital Stay: Payer: Commercial Managed Care - PPO | Attending: Hematology

## 2022-10-16 VITALS — BP 138/65 | HR 78 | Temp 98.3°F | Resp 18 | Wt 98.0 lb

## 2022-10-16 VITALS — BP 118/66 | HR 80 | Temp 98.6°F | Ht 63.0 in | Wt 98.2 lb

## 2022-10-16 DIAGNOSIS — Z803 Family history of malignant neoplasm of breast: Secondary | ICD-10-CM | POA: Insufficient documentation

## 2022-10-16 DIAGNOSIS — D3912 Neoplasm of uncertain behavior of left ovary: Secondary | ICD-10-CM | POA: Insufficient documentation

## 2022-10-16 DIAGNOSIS — C50412 Malignant neoplasm of upper-outer quadrant of left female breast: Secondary | ICD-10-CM | POA: Diagnosis present

## 2022-10-16 DIAGNOSIS — Z853 Personal history of malignant neoplasm of breast: Secondary | ICD-10-CM | POA: Diagnosis not present

## 2022-10-16 DIAGNOSIS — M810 Age-related osteoporosis without current pathological fracture: Secondary | ICD-10-CM | POA: Insufficient documentation

## 2022-10-16 DIAGNOSIS — R21 Rash and other nonspecific skin eruption: Secondary | ICD-10-CM

## 2022-10-16 DIAGNOSIS — Z9013 Acquired absence of bilateral breasts and nipples: Secondary | ICD-10-CM | POA: Diagnosis not present

## 2022-10-16 DIAGNOSIS — Z1501 Genetic susceptibility to malignant neoplasm of breast: Secondary | ICD-10-CM | POA: Insufficient documentation

## 2022-10-16 DIAGNOSIS — Z1509 Genetic susceptibility to other malignant neoplasm: Secondary | ICD-10-CM | POA: Diagnosis not present

## 2022-10-16 DIAGNOSIS — Z170 Estrogen receptor positive status [ER+]: Secondary | ICD-10-CM | POA: Diagnosis present

## 2022-10-16 DIAGNOSIS — Z148 Genetic carrier of other disease: Secondary | ICD-10-CM | POA: Diagnosis not present

## 2022-10-16 DIAGNOSIS — Z5189 Encounter for other specified aftercare: Secondary | ICD-10-CM | POA: Insufficient documentation

## 2022-10-16 MED ORDER — PEGFILGRASTIM-CBQV 6 MG/0.6ML ~~LOC~~ SOSY
6.0000 mg | PREFILLED_SYRINGE | Freq: Once | SUBCUTANEOUS | Status: AC
  Administered 2022-10-16: 6 mg via SUBCUTANEOUS
  Filled 2022-10-16: qty 0.6

## 2022-10-16 NOTE — Progress Notes (Signed)
Udenyca injection given per orders.Patient tolerated it well without problems. Vitals stable and discharged home from clinic ambulatory. Follow up as scheduled.

## 2022-10-16 NOTE — Patient Instructions (Signed)
[facility]  Discharge Instructions: Thank you for choosing [facility] to provide your oncology and hematology care.  If you have a lab appointment with the Cancer Center - please note that after April 8th, 2024, all labs will be drawn in the cancer center.  You do not have to check in or register with the main entrance as you have in the past but will complete your check-in in the cancer center.  Wear comfortable clothing and clothing appropriate for easy access to any Portacath or PICC line.   We strive to give you quality time with your provider. You may need to reschedule your appointment if you arrive late (15 or more minutes).  Arriving late affects you and other patients whose appointments are after yours.  Also, if you miss three or more appointments without notifying the office, you may be dismissed from the clinic at the provider's discretion.      For prescription refill requests, have your pharmacy contact our office and allow 72 hours for refills to be completed.    Today you received the following udenyca injection   To help prevent nausea and vomiting after your treatment, we encourage you to take your nausea medication as directed.  BELOW ARE SYMPTOMS THAT SHOULD BE REPORTED IMMEDIATELY: *FEVER GREATER THAN 100.4 F (38 C) OR HIGHER *CHILLS OR SWEATING *NAUSEA AND VOMITING THAT IS NOT CONTROLLED WITH YOUR NAUSEA MEDICATION *UNUSUAL SHORTNESS OF BREATH *UNUSUAL BRUISING OR BLEEDING *URINARY PROBLEMS (pain or burning when urinating, or frequent urination) *BOWEL PROBLEMS (unusual diarrhea, constipation, pain near the anus) TENDERNESS IN MOUTH AND THROAT WITH OR WITHOUT PRESENCE OF ULCERS (sore throat, sores in mouth, or a toothache) UNUSUAL RASH, SWELLING OR PAIN  UNUSUAL VAGINAL DISCHARGE OR ITCHING   Items with * indicate a potential emergency and should be followed up as soon as possible or go to the Emergency Department if any problems  should occur.  Please show the CHEMOTHERAPY ALERT CARD or IMMUNOTHERAPY ALERT CARD at check-in to the Emergency Department and triage nurse.  Should you have questions after your visit or need to cancel or reschedule your appointment, please contact [facility]ENTER AT [facility] [phone]  and follow the prompts.  Office hours are 8:00 a.m. to 4:30 p.m. Monday - Friday. Please note that voicemails left after 4:00 p.m. may not be returned until the following business day.  We are closed weekends and major holidays. You have access to a nurse at all times for urgent questions. Please call the main number to the clinic [phone] and follow the prompts.  For any non-urgent questions, you may also contact your provider using MyChart. We now offer e-Visits for anyone 98 and older to request care online for non-urgent symptoms. For details visit [email or web address].   Also download the MyChart app! Go to the app store, search "MyChart", open the app, select [place], and log in with your MyChart username and password.

## 2022-10-16 NOTE — Progress Notes (Signed)
Subjective:    Patient ID: Melissa Rodriguez, female    DOB: [birth year], 57 y.o.   MRN: [id number]  Discussed the use of AI scribe software for clinical note transcription with the patient, who gave verbal consent to proceed.  History of Present Illness   The patient, with a history of breast cancer, recently underwent chemotherapy and experienced significant cold sensations post-treatment. Despite feeling hot to the touch, she reported an internal feeling of coldness that was difficult to alleviate, even with increased room temperature, warm clothing, and hot food. She did not notice any fevers post-chemotherapy.  Blood work was conducted, and the patient reported that her vitals and blood work results were satisfactory. She mentioned a white blood cell count of 7.3 and hemoglobin of 13.5 from a test conducted two days prior. She also received an infusion on the day of the consultation, which was administered in the back of her arm due to a misunderstanding about the infusion site.  The patient also reported a rash, which she believes is a topical reaction from the surgical procedure. The rash had been itching severely, but was improving at the time of the consultation. She had not applied any topical treatments to it, but had been advised to use hydrocortisone cream and take Claritin for the itching.  The patient also reported persistent swelling in her feet and legs, which she attributed to the large volume of fluids received during her treatment. She also mentioned experiencing a general feeling of being unwell, similar to flu symptoms, which she attributed to the white blood cell stimulation from her treatment.  The patient also expressed concerns about her vision, describing it as "fuzzy" and stating that she was using reading glasses with a strength of three. She reported no issues with distance vision while driving. She also expressed a desire to see an eye specialist due to these  concerns.  The patient also mentioned a dark spot on her skin, which she had noticed over the summer. She had it checked and was told it was not a cause for concern. However, she expressed a desire to have it re-evaluated by a dermatologist due to its dark appearance.  The patient has a history of a skin condition long ago, which was treated and resolved. She expressed a fear of colonoscopy and was not interested in undergoing the procedure. She was open to discussing alternative screening options for colon cancer.         Review of Systems     Objective:    Physical Exam   MEASUREMENTS: WT- 90 HEENT: Dark spot on skin, characteristics of seborrheic keratosis. CHEST: Lungs clear to auscultation. CARDIOVASCULAR: Heart sounds normal. SKIN: Rash around port, topical allergic reaction.     There is a papular rash around the port this appears to be most likely a topical allergic reaction no sign of abscess or cellulitis      Assessment & Plan:  Assessment and Plan    Chemotherapy Side Effects Reports of feeling extremely cold post-chemotherapy, but no fever. Blood work shows good results with WBC 7.3 and Hb 13.5. -Continue current chemotherapy regimen. -Monitor for any new or worsening side effects.  Allergic Reaction to Surgical Prep Rash around the surgical port site, likely due to an allergic reaction to the surgical prep. No signs of infection. -Apply hydrocortisone 1% cream as needed for itching. -Consider stronger topical steroid (triamcinolone) if itching persists.  Seborrheic Keratosis Dark spot on skin, likely seborrheic keratosis. No signs of  malignancy. -Refer to dermatology for further evaluation and management.  Visual Changes Reports of fuzzy vision and increased need for reading glasses. -Refer to ophthalmology for comprehensive eye exam.  Colon Cancer Screening No family history of colon cancer. Discussed options for colon cancer screening, including  colonoscopy and Cologuard. -Refer to gastroenterology for further discussion and potential colon cancer screening.  General Health Maintenance -Continue Claritin as needed for allergies. -Consider Cologuard or colonoscopy for colon cancer screening. -Refer to dermatology for skin exam. -Refer to ophthalmology for eye exam.     We will be helping the patient set her up with [facility] dermatology [place] ophthalmolo

## 2022-10-19 ENCOUNTER — Telehealth: Payer: Self-pay | Admitting: Family Medicine

## 2022-10-19 NOTE — Addendum Note (Signed)
Addended by: Lilyan Punt A on: 10/19/2022 10:24 AM   Modules accepted: Orders

## 2022-10-19 NOTE — Telephone Encounter (Signed)
Nurses Patient needs to referrals [facility] for eye exam history of active breast cancer occasional fuzzy vision  Second referral [place] doctor for dermatology Due to abnormal moles and history.  History of skin cancer and breast cancer  Both of these are routine referrals please initiate thank you

## 2022-10-19 NOTE — Addendum Note (Signed)
Addended by: Lilyan Punt A on: 10/19/2022 10:25 AM   Modules accepted: Orders

## 2022-10-20 ENCOUNTER — Encounter (HOSPITAL_COMMUNITY): Payer: Self-pay | Admitting: Emergency Medicine

## 2022-10-20 ENCOUNTER — Other Ambulatory Visit: Payer: Self-pay

## 2022-10-20 ENCOUNTER — Emergency Department (HOSPITAL_COMMUNITY)
Admission: EM | Admit: 2022-10-20 | Discharge: 2022-10-20 | Disposition: A | Discharge status: Home / Self Care | Payer: Commercial Managed Care - PPO

## 2022-10-20 ENCOUNTER — Emergency Department (HOSPITAL_COMMUNITY): Payer: Commercial Managed Care - PPO

## 2022-10-20 DIAGNOSIS — K5900 Constipation, unspecified: Secondary | ICD-10-CM | POA: Insufficient documentation

## 2022-10-20 LAB — COMPREHENSIVE METABOLIC PANEL
ALT: 14 U/L (ref 0–44)
AST: 17 U/L (ref 15–41)
Albumin: 4 g/dL (ref 3.5–5.0)
Alkaline Phosphatase: 104 U/L (ref 38–126)
Anion gap: 8 (ref 5–15)
BUN: 15 mg/dL (ref 6–20)
CO2: 28 mmol/L (ref 22–32)
Calcium: 8.8 mg/dL — ABNORMAL LOW (ref 8.9–10.3)
Chloride: 98 mmol/L (ref 98–111)
Creatinine, Ser: 0.72 mg/dL (ref 0.44–1.00)
GFR, Estimated: >60 mL/min (ref >60)
Glucose, Bld: 107 mg/dL — ABNORMAL HIGH (ref 70–99)
Potassium: 4.1 mmol/L (ref 3.5–5.1)
Sodium: 134 mmol/L — ABNORMAL LOW (ref 135–145)
Total Bilirubin: 0.7 mg/dL (ref 0.3–1.2)
Total Protein: 6.4 g/dL — ABNORMAL LOW (ref 6.5–8.1)

## 2022-10-20 LAB — CBC
HCT: 41.8 % (ref 36.0–46.0)
Hemoglobin: 13.8 g/dL (ref 12.0–15.0)
MCH: 29.5 pg (ref 26.0–34.0)
MCHC: 33 g/dL (ref 30.0–36.0)
MCV: 89.3 fL (ref 80.0–100.0)
Platelets: 117 10*3/uL — ABNORMAL LOW (ref 150–400)
RBC: 4.68 MIL/uL (ref 3.87–5.11)
RDW: 13.2 % (ref 11.5–15.5)
WBC: 17.7 10*3/uL — ABNORMAL HIGH (ref 4.0–10.5)
nRBC: 0 % (ref 0.0–0.2)

## 2022-10-20 LAB — LIPASE, BLOOD: Lipase: 32 U/L (ref 11–51)

## 2022-10-20 NOTE — Discharge Instructions (Addendum)
As discussed take over-the-counter MiraLAX.  Follow-up with your primary doctor.  Return immediately for fevers, chills, chest pain, shortness of breath, abdominal pain or any new or worsening symptoms that are concerning to you.

## 2022-10-20 NOTE — ED Triage Notes (Signed)
Pt received Udenyca injection on Wednesday and states she has been having flu-like symptoms and constipation since.

## 2022-10-20 NOTE — ED Provider Notes (Signed)
[place] [facility] AT [facility] Provider Note   CSN: [id number] Arrival date & time: 10/20/22  4098     History  Chief Complaint  Patient presents with   Allergic Reaction    Melissa Rodriguez is a 57 y.o. female.  This is a 57 year old female presenting to the emergency department for constipation.  Received Undenyca injections past Wednesday since that time she has had generalized malaise and flulike symptoms.  Was told that this was reported normal side effect.  No fevers, chills, chest pain, shortness of breath.  Since Wednesday she has been constipated with few bowel movements.  This morning she had diffuse diarrhea, but feels pressure in rectum like she needs to have a bowel movement.   Allergic Reaction      Home Medications Prior to Admission medications   Medication Sig Start Date End Date Taking? Authorizing Provider  Bioflavonoid Products (GRAPE SEED PO) Take 1 capsule by mouth daily.    [provider]  Calcium Carbonate Antacid (TUMS PO) Take 1 tablet by mouth as needed. Reported on 05/22/2015    [provider]  CARBOPLATIN IV Inject into the vein every 21 ( twenty-one) days. 10/14/22   [provider]  Cholecalciferol (VITAMIN D PO) Take 1,000 Units by mouth daily.     [provider]  DOCETAXEL IV Inject into the vein every 21 ( twenty-one) days. 10/14/22   [provider]  lidocaine-prilocaine (EMLA) cream Apply a quarter-sized amount to port a cath site and cover with plastic wrap 1 hour prior to infusion appointments 10/03/22   Doreatha Massed, MD  PERTUZUMAB IV Inject into the vein every 21 ( twenty-one) days. 10/14/22   [provider]  prochlorperazine (COMPAZINE) 10 MG tablet Take 1 tablet (10 mg total) by mouth every 6 (six) hours as needed for nausea or vomiting. 10/03/22   Doreatha Massed, MD  TRASTUZUMAB IV Inject into the vein every 21 ( twenty-one) days. 10/14/22   [provider]      Allergies    Benadryl [diphenhydramine hcl], Diphenhydramine hcl, Hydrocodone, Penicillins, Ciprofloxacin, Docetaxel, and Trastuzumab-anns [trastuzumab-pkrb]    Review of Systems   Review of Systems  Physical Exam Updated Vital Signs BP (!) 142/60 (BP Location: Right Arm)   Pulse 96   Temp 97.8 F (36.6 C) (Oral)   Resp 18   Ht 5\' 3"  (1.6 m)   Wt 45 kg   LMP 07/05/2012   SpO2 98%   BMI 17.57 kg/m  Physical Exam Vitals and nursing note reviewed.  HENT:     Head: Normocephalic.     Mouth/Throat:     Mouth: Mucous membranes are moist.  Eyes:     Conjunctiva/sclera: Conjunctivae normal.  Cardiovascular:     Rate and Rhythm: Normal rate.  Pulmonary:     Effort: Pulmonary effort is normal.     Breath sounds: Normal breath sounds.  Abdominal:     General: Abdomen is flat. There is no distension.     Tenderness: There is no abdominal tenderness. There is no guarding or rebound.  Musculoskeletal:     Right lower leg: No edema.     Left lower leg: No edema.  Skin:    General: Skin is warm.     Capillary Refill: Capillary refill takes less than 2 seconds.  Neurological:     Mental Status: She is alert and oriented to person, place, and time.  Psychiatric:  Mood and Affect: Mood normal.        Behavior: Behavior normal.     ED Results / Procedures / Treatments   Labs (all labs ordered are listed, but only abnormal results are displayed) Labs Reviewed  CBC - Abnormal; Notable for the following components:      Result Value   WBC 17.7 (*)    Platelets 117 (*)    All other components within normal limits  COMPREHENSIVE METABOLIC PANEL - Abnormal; Notable for the following components:   Sodium 134 (*)    Glucose, Bld 107 (*)    Calcium 8.8 (*)    Total Protein 6.4 (*)    All other components within normal limits  LIPASE, BLOOD    EKG None  Radiology DG Abd Portable 1 View  Result Date: 10/20/2022 CLINICAL DATA:  57 year old female  with history of constipation. EXAM: PORTABLE ABDOMEN - 1 VIEW COMPARISON:  Abdominal radiograph 02/26/2018. FINDINGS: Gas and stool are seen scattered throughout the colon extending to the level of the distal rectum. No pathologic distension of small bowel is noted. No gross evidence of pneumoperitoneum. Stool burden does not appear excessive. Surgical clip projecting over the left upper quadrant of the abdomen. IMPRESSION: 1. Nonobstructive bowel gas pattern. 2. No pneumoperitoneum. Electronically Signed   By: Trudie Reed M.D.   On: 10/20/2022 07:53    Procedures Procedures    Medications Ordered in ED Medications - No data to display  ED Course/ Medical Decision Making/ A&P Clinical Course as of 10/20/22 5638  [facility] Oct 20, 2022  0706 On chemo per chart review:"BREAST  Docetaxel + Carboplatin + Trastuzumab + Pertuzumab  (TCHP) q21d / Trastuzumab + Pertuzumab q21d " [TY]  0754 CBC(!) Leukocytosis noted; did have recent pegfilgrastim injection.  [TY]  0757 Comprehensive metabolic panel(!) No significant metabolic derangements.  No transaminitis to suggest hepatobiliary disease. [TY]  0757 Lipase: 32 Pancreatitis unlikely [TY]  0757 DG Abd Portable 1 View IMPRESSION: 1. Nonobstructive bowel gas pattern. 2. No pneumoperitoneum.   [TY]  [id number] Digital rectal exam with nursing chaperone present with hard stool in the rectal vault.  No masses. [TY]  [id number] Patient had bowel movement.  Is feeling improved.  Stable for discharge.  Discussed MiraLAX for constipation.  Follow-up with PCP. [TY]    Clinical Course User Index [TY] Coral Spikes, DO                                 Medical Decision Making This is a well-appearing 57 year old female with history of breast cancer currently on chemotherapy presenting to the emergency department for constipation.  Also having flulike symptoms in the setting of recent injection with this as a known side effect. she is afebrile nontachycardic  normotensive.  She is not having abdominal pain, and has a soft nontender abdomen.  She is not taking any stool softeners.  Given patient's immunocompromise status with flulike symptoms will get basic abdominal screening labs as she did have mildly elevated heart rate on arrival.  Will get x-ray to evaluate for stool burden. See ED course for further MDM and dispositoin.   Amount and/or Complexity of Data Reviewed Independent Historian:     Details: Husband notes flulike symptoms was told to be a known side effect of the injection and they were told to take antihistamines External Data Reviewed:     Details: See ED course Labs: ordered. Decision-making details documented  in ED Course. Radiology: ordered. Decision-making details documented in ED Course.  Risk Decision regarding hospitalization.          Final Clinical Impression(s) / ED Diagnoses Final diagnoses:  Constipation, unspecified constipation type    Rx / DC Orders ED Discharge Orders     None         Coral Spikes, DO 10/20/22 [phone]

## 2022-10-21 ENCOUNTER — Other Ambulatory Visit (HOSPITAL_COMMUNITY): Payer: Self-pay | Admitting: Hematology

## 2022-10-21 ENCOUNTER — Telehealth: Payer: Self-pay | Admitting: *Deleted

## 2022-10-21 ENCOUNTER — Other Ambulatory Visit: Payer: Self-pay

## 2022-10-21 ENCOUNTER — Other Ambulatory Visit: Payer: Self-pay | Admitting: *Deleted

## 2022-10-21 DIAGNOSIS — H539 Unspecified visual disturbance: Secondary | ICD-10-CM

## 2022-10-21 DIAGNOSIS — D229 Melanocytic nevi, unspecified: Secondary | ICD-10-CM

## 2022-10-21 MED ORDER — ALUMINUM & MAGNESIUM HYDROXIDE 200-200 MG/5ML PO SUSP: 15.0000 mL | Freq: Four times a day (QID) | ORAL | 2 refills | Status: DC | PRN

## 2022-10-21 MED ORDER — DIPHENOXYLATE-ATROPINE 2.5-0.025 MG PO TABS: 1.0000 | ORAL_TABLET | Freq: Four times a day (QID) | ORAL | 0 refills | Status: DC | PRN

## 2022-10-21 MED ORDER — LIDOCAINE VISCOUS HCL 2 % MT SOLN
15.0000 mL | Freq: Four times a day (QID) | OROMUCOSAL | 2 refills | Status: DC
Start: 2022-10-21 — End: 2023-10-22

## 2022-10-21 NOTE — Telephone Encounter (Signed)
Patient called to advise that she has had severe diarrhea x 2 days without relief with imodium.  Per Dr. Ellin Saba, he has sent Lomotil to her pharmacy. Instructed her to take it like she takes Imodium, 2 tablets at the first onset of diarrhea followed by 1 tablet after each watery bowel movement. However prescription says to take 1 tablet 4 times daily as needed.  Advised not to exceed 8 tablets in a 24 hour period.  Will contact us if she continues to have problems.  Verbalized understanding.

## 2022-10-22 ENCOUNTER — Other Ambulatory Visit: Payer: Self-pay

## 2022-10-23 ENCOUNTER — Ambulatory Visit (INDEPENDENT_AMBULATORY_CARE_PROVIDER_SITE_OTHER): Payer: Commercial Managed Care - PPO | Admitting: Nurse Practitioner

## 2022-10-23 ENCOUNTER — Encounter: Payer: Self-pay | Admitting: Family Medicine

## 2022-10-23 VITALS — BP 135/75 | HR 97 | Temp 98.2°F | Ht 63.0 in | Wt 92.6 lb

## 2022-10-23 DIAGNOSIS — K5903 Drug induced constipation: Secondary | ICD-10-CM | POA: Diagnosis not present

## 2022-10-23 DIAGNOSIS — T451X5D Adverse effect of antineoplastic and immunosuppressive drugs, subsequent encounter: Secondary | ICD-10-CM | POA: Diagnosis not present

## 2022-10-23 NOTE — Progress Notes (Unsigned)
Subjective:    Patient ID: Melissa Rodriguez, female    DOB: Jun 17, 1965, 57 y.o.   MRN: [id number]  HPI Presents for recheck after ED visit on 10/20/2022 for constipation related to her chemotherapy treatment for breast cancer.  Usually has regular BMs with no issues.  The symptoms became more severe on Sunday, went to the local ED where she had an enema to relieve her of the impaction.  The next day she began having frequent diarrhea.  This was better yesterday, has been taking Imodium.  BMs are back to normal today.  Normal appetite and fluid intake. Is scheduled to have several treatments every 21 days.  Is also given an injection for "her white blood cell count" which caused extreme fatigue and made her feel very bad.  Plans to discuss this with oncology.  Review of Systems  Respiratory:  Negative for cough, chest tightness and shortness of breath.   Cardiovascular:  Negative for chest pain.  Gastrointestinal:  Negative for abdominal pain, constipation, diarrhea, nausea and vomiting.       Objective:   Physical Exam NAD.  Alert, oriented.  Lungs clear.  Heart regular rate rhythm.  Abdomen soft nondistended with active bowel sounds x 4.  Nontender to palpation.  No masses noted.  No rebound or guarding.  Also note healing surgical wounds from recent bilateral mastectomy with no signs of infection. Today's Vitals   10/23/22 1529  BP: 135/75  Pulse: 97  Temp: 98.2 F (36.8 C)  SpO2: 97%  Weight: 92 lb 9.6 oz (42 kg)  Height: 5\' 3"  (1.6 m)   Body mass index is 16.4 kg/m.        Assessment & Plan:   Problem List Items Addressed This Visit       Digestive   Drug-induced constipation - Primary     Other   Chemotherapy adverse reaction    Specifically chemotherapy induced constipation  Given samples of MiraLAX to begin 1 to 2 days before her next treatment.  Patient plans to discuss her side effects with oncology.  Call back if further problems.

## 2022-10-24 ENCOUNTER — Encounter: Payer: Self-pay | Admitting: Nurse Practitioner

## 2022-10-24 DIAGNOSIS — K5903 Drug induced constipation: Secondary | ICD-10-CM | POA: Insufficient documentation

## 2022-10-24 DIAGNOSIS — T451X5A Adverse effect of antineoplastic and immunosuppressive drugs, initial encounter: Secondary | ICD-10-CM | POA: Insufficient documentation

## 2022-10-25 ENCOUNTER — Encounter: Payer: Self-pay | Admitting: Oncology

## 2022-10-25 ENCOUNTER — Encounter: Payer: Self-pay | Admitting: Hematology

## 2022-10-25 ENCOUNTER — Inpatient Hospital Stay (HOSPITAL_BASED_OUTPATIENT_CLINIC_OR_DEPARTMENT_OTHER): Payer: Commercial Managed Care - PPO | Admitting: Oncology

## 2022-10-25 VITALS — BP 147/69 | HR 97 | Temp 97.8°F | Resp 18 | Ht 63.0 in | Wt 92.7 lb

## 2022-10-25 DIAGNOSIS — Z7189 Other specified counseling: Secondary | ICD-10-CM | POA: Diagnosis not present

## 2022-10-25 DIAGNOSIS — C50412 Malignant neoplasm of upper-outer quadrant of left female breast: Secondary | ICD-10-CM

## 2022-10-25 DIAGNOSIS — Z170 Estrogen receptor positive status [ER+]: Secondary | ICD-10-CM | POA: Diagnosis not present

## 2022-10-25 MED ORDER — PREDNISONE 20 MG PO TABS: ORAL_TABLET | ORAL | 0 refills | Status: DC

## 2022-10-25 MED ORDER — MONTELUKAST SODIUM 10 MG PO TABS: ORAL_TABLET | ORAL | 0 refills | Status: DC

## 2022-10-27 ENCOUNTER — Encounter: Payer: Self-pay | Admitting: Hematology

## 2022-10-27 NOTE — Progress Notes (Signed)
Hematology/Oncology Consult note [facility]  Telephone:([phone] [phone]  Patient Care Team: Babs Sciara, MD as PCP - General (Family Medicine) Doreatha Massed, MD as Medical Oncologist (Medical Oncology) Therese Sarah, RN as Oncology Nurse Navigator (Medical Oncology)   Name of the patient: Melissa Rodriguez  [id number]  November 03, 1965   Date of visit: 10/27/22  Diagnosis-pathologic prognostic stage Ib invasive mammary carcinoma of the left breast T2 N1 M0 ER/PR positive HER2 positive  Chief complaint/ Reason for visit-transfer of care and further management of breast cancer  Heme/Onc history: Patient is a 57 year old female with a prior history of lumpectomy in 2008 which showed a 7 mm stage I ER/PR positive HER2 negative left breast cancer.  She underwent lumpectomy and adjuvant radiation therapy but did not go through endocrine therapy.  She self palpated a left breast lump led to a diagnostic mammogram in July 2024.  Diagnostic mammogram showed 1.9 cm irregular hypoechoic mass 1 o'clock position 7 cm from the breast grade 3 ER 99% positive, PR 85% positive and HER2 equivocal by IHC and FISH positive.  Ki-67 35% there were calcifications noted in the right breast as well which were biopsy-proven benign.    She underwent genetic testing and was found to have BRCA2 mutation  Patient was seen by Dr. Ellin Saba and he recommended upfront surgery and consideration for adjuvant chemotherapy.  Patient underwent bilateral mastectomy on 08/15/2022.  No evidence of malignancy in the right breast.  There were 2 distinct masses noted in the left breast with dominant mass measured 2.4 x 2.3 x 1.6 cm grade 3 with negative margins.  There was another 1 cm mass where there was uptake of radiotracer dye with positive posterior lateral anterior margin.  This was believed to be the sentinel lymph node although on pathology it states that there were no lymph nodes  identified likely due to prior lumpectomy. mpT2.  She was staged as T2 N1 triple positive disease.  She was recommended adjuvant TCHP chemotherapy and received cycle 1 on 10/14/2022  Interval history-patient is doing well post bilateral mastectomy.  She is also undergoing physical therapy to reduce risk of postmastectomy lymphedema  ECOG PS- 0 Pain scale- 0   Review of systems- Review of Systems  Constitutional:  Negative for chills, fever, malaise/fatigue and weight loss.  HENT:  Negative for congestion, ear discharge and nosebleeds.   Eyes:  Negative for blurred vision.  Respiratory:  Negative for cough, hemoptysis, sputum production, shortness of breath and wheezing.   Cardiovascular:  Negative for chest pain, palpitations, orthopnea and claudication.  Gastrointestinal:  Negative for abdominal pain, blood in stool, constipation, diarrhea, heartburn, melena, nausea and vomiting.  Genitourinary:  Negative for dysuria, flank pain, frequency, hematuria and urgency.  Musculoskeletal:  Negative for back pain, joint pain and myalgias.  Skin:  Negative for rash.  Neurological:  Negative for dizziness, tingling, focal weakness, seizures, weakness and headaches.  Endo/Heme/Allergies:  Does not bruise/bleed easily.  Psychiatric/Behavioral:  Negative for depression and suicidal ideas. The patient does not have insomnia.       Allergies  Allergen Reactions   Benadryl [Diphenhydramine Hcl]     Jittery and Hallucinations   Diphenhydramine Hcl Anxiety    Other Reaction(s): Hallucination  Jittery and Hallucinations    Made her feel like she was crawling the walls.   Hydrocodone Shortness Of Breath    Felt like she could not breathe-February 2020   Penicillins Hives and Rash  Did it involve swelling of the face/tongue/throat, SOB, or low BP? No  Did it involve sudden or severe rash/hives, skin peeling, or any reaction on the inside of your mouth or nose? No  Did you need to seek medical  attention at a hospital or doctor's office? No  When did it last happen?        If all above answers are "NO", may proceed with cephalosporin use.  Any medicine related to PCN    Did it involve swelling of the face/tongue/throat, SOB, or low BP? No Did it involve sudden or severe rash/hives, skin peeling, or any reaction on the inside of your mouth or nose? No Did you need to seek medical attention at a hospital or doctor's office? No When did it last happen?       If all above answers are "NO", may proceed with cephalosporin use.   Ciprofloxacin Hives   Docetaxel Other (See Comments)    Tingling of lips, teeth hurting, hot tongue   Trastuzumab-Anns [Trastuzumab-Pkrb] Itching    Tingling and hot tongue and lips     Past Medical History:  Diagnosis Date   Adenocarcinoma of breast (HCC) 05/20/2011   Stage I (T1b N0), grade 1, well-differentiated adenocarcinoma of the medial lower quadrant of the left breast. It was a tubular carcinoma, status post lumpectomy on 03/13/2006, ER positive 79%, PR positive 92%, HER2/neu negative, Ki-67 marker low at 18%. Her cancer was 7 mm in size, and she had 4 sentinel nodes all negative along with her lumpectomy. She was treated with radiation therapy postoper   Complication of anesthesia    pt states her bladder did not wake up after one surgery. Went to ER, had a urinary catheter for 1 week   History of kidney stones    Iron deficiency    Iron deficiency anemia 05/20/2011   Secondary to GU bleeding.  Good absorber of PO iron   Personal history of radiation therapy      Past Surgical History:  Procedure Laterality Date   BREAST BIOPSY Left 07/16/2022   [place] LT BREAST BX W LOC DEV 1ST LESION IMG BX SPEC US GUIDE 07/16/2022 Edwin Cap, MD [facility]   BREAST BIOPSY Right 07/26/2022   MM RT BREAST BX W LOC DEV 1ST LESION IMAGE BX SPEC STEREO GUIDE 07/26/2022 [facility]   BREAST LUMPECTOMY Left    EXTRACORPOREAL SHOCK WAVE LITHOTRIPSY Left  02/26/2018   Procedure: EXTRACORPOREAL SHOCK WAVE LITHOTRIPSY (ESWL);  Surgeon: Malen Gauze, MD;  Location: [facility];  Service: Urology;  Laterality: Left;   LUMPECTOM LEFT BREAST  02/2006   LYMPH NODE BIOPSY LEFT  04/2006   MASTECTOMY W/ SENTINEL NODE BIOPSY Left 08/15/2022   Procedure: LEFT SIMPLE MASTECTOMY, LEFT SENTINEL LYMPH NODE MAPPING;  Surgeon: Harriette Bouillon, MD;  Location: [facility];  Service: General;  Laterality: Left;   PORTACATH PLACEMENT N/A 10/09/2022   Procedure: INSERTION PORT-A-CATH;  Surgeon: Harriette Bouillon, MD;  Location: [facility];  Service: General;  Laterality: N/A;   SIMPLE MASTECTOMY WITH AXILLARY SENTINEL NODE BIOPSY Right 08/15/2022   Procedure: RIGHT RISK REDUCING MASTECTOMY;  Surgeon: Harriette Bouillon, MD;  Location: [facility];  Service: General;  Laterality: Right;    Social History   Socioeconomic History   Marital status: Married    Spouse name: Not on file   Number of children: Not on file   Years of education: Not on file   Highest education level: Some college,  no degree  Occupational History   Not on file  Tobacco Use   Smoking status: Never   Smokeless tobacco: Never  Vaping Use   Vaping status: Never Used  Substance and Sexual Activity   Alcohol use: No   Drug use: No   Sexual activity: Yes    Birth control/protection: Post-menopausal  Other Topics Concern   Not on file  Social History Narrative   Not on file   Social Determinants of Health   Financial Resource Strain: Patient Declined (10/16/2022)   Overall Financial Resource Strain (CARDIA)    Difficulty of Paying Living Expenses: Patient declined  Recent Concern: Physicist, medical Strain - High Risk (10/03/2022)   Overall Financial Resource Strain (CARDIA)    Difficulty of Paying Living Expenses: Hard  Food Insecurity: No Food Insecurity (10/16/2022)   Hunger Vital Sign    Worried About Running Out of Food in the Last Year: Never  true    Ran Out of Food in the Last Year: Never true  Transportation Needs: No Transportation Needs (10/16/2022)   PRAPARE - Administrator, Civil Service (Medical): No    Lack of Transportation (Non-Medical): No  Physical Activity: Insufficiently Active (10/16/2022)   Exercise Vital Sign    Days of Exercise per Week: 1 day    Minutes of Exercise per Session: 20 min  Stress: No Stress Concern Present (10/16/2022)   [employer] of Occupational Health - Occupational Stress Questionnaire    Feeling of Stress : Not at all  Social Connections: Unknown (10/16/2022)   Social Connection and Isolation Panel [NHANES]    Frequency of Communication with Friends and Family: Patient declined    Frequency of Social Gatherings with Friends and Family: Patient declined    Attends Religious Services: More than 4 times per year    Active Member of [employer] or Organizations: Yes    Attends Engineer, structural: More than 4 times per year    Marital Status: Married  Catering manager Violence: Not on file    Family History  Problem Relation Age of Onset   Hypertension Mother    Hypertension Father    Heart disease Father 40       MI in his 31s   Cancer Maternal Aunt        unk type, possibly hip. dx >50   Breast cancer Paternal Aunt        dx 4s   Cancer Paternal Aunt        unknown, d. 61s   Prostate cancer Cousin        dx 40s-50s   Stomach cancer Cousin        dx 40s-50s     Current Outpatient Medications:    aluminum-magnesium hydroxide 200-200 MG/5ML suspension, Take 15 mLs by mouth every 6 (six) hours as needed for indigestion. Mix 1:1 with Lidocaine and swish and spit 4 times a day, Disp: 355 mL, Rfl: 2   Bioflavonoid Products (GRAPE SEED PO), Take 1 capsule by mouth daily., Disp: , Rfl:    Calcium Carbonate Antacid (TUMS PO), Take 1 tablet by mouth as needed. Reported on 05/22/2015, Disp: , Rfl:    CARBOPLATIN IV, Inject into the vein every 21 ( twenty-one) days.,  Disp: , Rfl:    Cholecalciferol (VITAMIN D PO), Take 1,000 Units by mouth daily. , Disp: , Rfl:    diphenoxylate-atropine (LOMOTIL) 2.5-0.025 MG tablet, Take 1 tablet by mouth 4 (four) times daily as needed for diarrhea or  loose stools., Disp: 60 tablet, Rfl: 0   DOCETAXEL IV, Inject into the vein every 21 ( twenty-one) days., Disp: , Rfl:    lidocaine (XYLOCAINE) 2 % solution, Use as directed 15 mLs in the mouth or throat 4 (four) times daily. Mix 1:1 with maalox and swish and spit 4 times a day, Disp: 100 mL, Rfl: 2   lidocaine-prilocaine (EMLA) cream, Apply a quarter-sized amount to port a cath site and cover with plastic wrap 1 hour prior to infusion appointments, Disp: 30 g, Rfl: 3   montelukast (SINGULAIR) 10 MG tablet, Take one 10mg  tab for 2 days before chemo (day 1 and day 2), Disp: 2 tablet, Rfl: 0   PERTUZUMAB IV, Inject into the vein every 21 ( twenty-one) days., Disp: , Rfl:    predniSONE (DELTASONE) 20 MG tablet, Take one 20mg  tab for 2 days before chemo (day 1 and day 2), Disp: 20 tablet, Rfl: 0   prochlorperazine (COMPAZINE) 10 MG tablet, Take 1 tablet (10 mg total) by mouth every 6 (six) hours as needed for nausea or vomiting., Disp: 60 tablet, Rfl: 3   TRASTUZUMAB IV, Inject into the vein every 21 ( twenty-one) days., Disp: , Rfl:   Physical exam:  Vitals:   10/25/22 0953  BP: (!) 147/69  Pulse: 97  Resp: 18  Temp: 97.8 F (36.6 C)  TempSrc: Tympanic  SpO2: 100%  Weight: 92 lb 11.2 oz (42 kg)  Height: 5\' 3"  (1.6 m)   Physical Exam Cardiovascular:     Rate and Rhythm: Normal rate and regular rhythm.     Heart sounds: Normal heart sounds.  Pulmonary:     Effort: Pulmonary effort is normal.     Breath sounds: Normal breath sounds.  Abdominal:     General: Bowel sounds are normal.     Palpations: Abdomen is soft.  Skin:    General: Skin is warm and dry.  Neurological:     Mental Status: She is alert and oriented to person, place, and time.   Patient is s/p  bilateral mastectomy without reconstruction.  No evidence of chest wall recurrence.     Latest Ref Rng & Units 10/20/2022    7:27 AM  CMP  Glucose 70 - 99 mg/dL 161   BUN 6 - 20 mg/dL 15   Creatinine 0.96 - 1.00 mg/dL 0.45   Sodium 409 - 811 mmol/L 134   Potassium 3.5 - 5.1 mmol/L 4.1   Chloride 98 - 111 mmol/L 98   CO2 22 - 32 mmol/L 28   Calcium 8.9 - 10.3 mg/dL 8.8   Total Protein 6.5 - 8.1 g/dL 6.4   Total Bilirubin 0.3 - 1.2 mg/dL 0.7   Alkaline Phos 38 - 126 U/L 104   AST 15 - 41 U/L 17   ALT 0 - 44 U/L 14       Latest Ref Rng & Units 10/20/2022    7:27 AM  CBC  WBC 4.0 - 10.5 K/uL 17.7   Hemoglobin 12.0 - 15.0 g/dL 91.4   Hematocrit 78.2 - 46.0 % 41.8   Platelets 150 - 400 K/uL 117     No images are attached to the encounter.  DG Abd Portable 1 View  Result Date: 10/20/2022 CLINICAL DATA:  57 year old female with history of constipation. EXAM: PORTABLE ABDOMEN - 1 VIEW COMPARISON:  Abdominal radiograph 02/26/2018. FINDINGS: Gas and stool are seen scattered throughout the colon extending to the level of the distal rectum. No pathologic distension of  small bowel is noted. No gross evidence of pneumoperitoneum. Stool burden does not appear excessive. Surgical clip projecting over the left upper quadrant of the abdomen. IMPRESSION: 1. Nonobstructive bowel gas pattern. 2. No pneumoperitoneum. Electronically Signed   By: Trudie Reed M.D.   On: 10/20/2022 07:53   DG Chest Port 1 View  Result Date: 10/09/2022 CLINICAL DATA:  Port-A-Cath placement. EXAM: PORTABLE CHEST 1 VIEW COMPARISON:  None Available. FINDINGS: The heart size and mediastinal contours are within normal limits. Right internal jugular Port-A-Cath is noted with distal tip in expected position of cavoatrial junction. No pneumothorax is noted. Both lungs are clear. The visualized skeletal structures are unremarkable. IMPRESSION: Interval placement of right internal jugular Port-A-Cath with distal tip in expected  position of cavoatrial junction. Electronically Signed   By: Lupita Raider M.D.   On: 10/09/2022 12:13   DG C-Arm 1-60 Min-No Report  Result Date: 10/09/2022 Fluoroscopy was utilized by the requesting physician.  No radiographic interpretation.     Assessment and plan- Patient is a 57 y.o. female with history of BRCA2 mutation and second left breast cancer stage Ib MP T2 N0 versus pT1 cN1 M0 ER/PR positive HER2 positive s/p 1 cycle of adjuvant TCHP chemotherapy here for transfer of care  I have discussed the results of pathology with the patient in detail.  Patient was found to have a dominant 2.4 cm mass with negative margins.  There was another mass that was 1 cm with positive margins and as per surgery assessment this appears to be a second synchronous primary rather than a lymph node.  Sentinel lymph nodes were not found at the time of surgery likely due to her prior lumpectomy.  As per Dr. Rosezena Sensor discussion with the patient there was no role for any further reexcision given bilateral mastectomy.  Patient has already undergone 1 cycle of adjuvant TCHP chemotherapy.  At this point regardless of whether we are treating this as an MP T2 N0 disease versus pT1c N1 disease, I agree with continuing with adjuvant TCHP chemotherapy for 5 more cycles.  Following that I will continue Herceptin and Perjeta every 3 weeks for 1 year.  I do not think the patient would benefit from postmastectomy radiation but I will reach out to radiation oncology regarding this especially given the positive margin for the 1 cm mass and history of BRCA2 mutation  If we considered this is N0 disease patient would not technically qualify for adjuvant olaparib but I will think about this down the line when she completes chemotherapy.  Given that she has ER positive disease there would also be role for endocrine therapy when she completes [facility] chemotherapy which I will discuss with her down the line.  Treatment will be given  with a curative intent  Patient comprehends my plan well.  I will tentatively see her back in 2 weeks for cycle 2 of adjuvant TCHP chemotherapy   Visit Diagnosis 1. Malignant neoplasm of upper-outer quadrant of left breast in female, estrogen receptor positive (HCC)   2. Goals of care, counseling/discussion      Dr. Owens Shark, MD, MPH [facility] [phone] 10/27/2022 7:10 PM

## 2022-10-28 ENCOUNTER — Other Ambulatory Visit: Payer: Self-pay

## 2022-10-28 DIAGNOSIS — Z170 Estrogen receptor positive status [ER+]: Secondary | ICD-10-CM

## 2022-10-29 ENCOUNTER — Other Ambulatory Visit: Payer: Self-pay | Admitting: Oncology

## 2022-10-29 ENCOUNTER — Encounter: Payer: Self-pay | Admitting: Hematology

## 2022-10-29 DIAGNOSIS — Z170 Estrogen receptor positive status [ER+]: Secondary | ICD-10-CM

## 2022-10-29 NOTE — Addendum Note (Signed)
Addended by: Amaryllis Dyke on: 10/29/2022 09:23 AM   Modules accepted: Orders

## 2022-10-31 ENCOUNTER — Emergency Department (HOSPITAL_COMMUNITY): Payer: Commercial Managed Care - PPO

## 2022-10-31 ENCOUNTER — Encounter (HOSPITAL_COMMUNITY): Payer: Self-pay | Admitting: Emergency Medicine

## 2022-10-31 ENCOUNTER — Encounter: Payer: Self-pay | Admitting: Oncology

## 2022-10-31 ENCOUNTER — Emergency Department (HOSPITAL_COMMUNITY)
Admission: EM | Admit: 2022-10-31 | Discharge: 2022-10-31 | Disposition: A | Discharge status: Home / Self Care | Payer: Commercial Managed Care - PPO | Attending: Emergency Medicine | Admitting: Emergency Medicine

## 2022-10-31 ENCOUNTER — Other Ambulatory Visit: Payer: Self-pay

## 2022-10-31 ENCOUNTER — Other Ambulatory Visit: Payer: Self-pay | Admitting: Oncology

## 2022-10-31 ENCOUNTER — Encounter: Payer: Self-pay | Admitting: Hematology

## 2022-10-31 ENCOUNTER — Telehealth: Payer: Self-pay | Admitting: *Deleted

## 2022-10-31 ENCOUNTER — Other Ambulatory Visit: Payer: Self-pay | Admitting: *Deleted

## 2022-10-31 DIAGNOSIS — R1900 Intra-abdominal and pelvic swelling, mass and lump, unspecified site: Secondary | ICD-10-CM | POA: Diagnosis not present

## 2022-10-31 DIAGNOSIS — Z1501 Genetic susceptibility to malignant neoplasm of breast: Secondary | ICD-10-CM

## 2022-10-31 DIAGNOSIS — N3001 Acute cystitis with hematuria: Secondary | ICD-10-CM | POA: Diagnosis not present

## 2022-10-31 DIAGNOSIS — R1030 Lower abdominal pain, unspecified: Secondary | ICD-10-CM | POA: Diagnosis present

## 2022-10-31 DIAGNOSIS — Z853 Personal history of malignant neoplasm of breast: Secondary | ICD-10-CM | POA: Diagnosis not present

## 2022-10-31 DIAGNOSIS — C50412 Malignant neoplasm of upper-outer quadrant of left female breast: Secondary | ICD-10-CM

## 2022-10-31 LAB — CBC WITH DIFFERENTIAL/PLATELET
Abs Immature Granulocytes: 0.34 10*3/uL — ABNORMAL HIGH (ref 0.00–0.07)
Basophils Absolute: 0.1 10*3/uL (ref 0.0–0.1)
Basophils Relative: 0 %
Eosinophils Absolute: 0 10*3/uL (ref 0.0–0.5)
Eosinophils Relative: 0 %
HCT: 34.3 % — ABNORMAL LOW (ref 36.0–46.0)
Hemoglobin: 11.5 g/dL — ABNORMAL LOW (ref 12.0–15.0)
Immature Granulocytes: 1 %
Lymphocytes Relative: 4 %
Lymphs Abs: 0.9 10*3/uL (ref 0.7–4.0)
MCH: 29.7 pg (ref 26.0–34.0)
MCHC: 33.5 g/dL (ref 30.0–36.0)
MCV: 88.6 fL (ref 80.0–100.0)
Monocytes Absolute: 0.9 10*3/uL (ref 0.1–1.0)
Monocytes Relative: 4 %
Neutro Abs: 21.7 10*3/uL — ABNORMAL HIGH (ref 1.7–7.7)
Neutrophils Relative %: 91 %
Platelets: 188 10*3/uL (ref 150–400)
RBC: 3.87 MIL/uL (ref 3.87–5.11)
RDW: 13.7 % (ref 11.5–15.5)
WBC: 23.9 10*3/uL — ABNORMAL HIGH (ref 4.0–10.5)
nRBC: 0 % (ref 0.0–0.2)

## 2022-10-31 LAB — URINALYSIS, ROUTINE W REFLEX MICROSCOPIC

## 2022-10-31 LAB — URINALYSIS, MICROSCOPIC (REFLEX)
RBC / HPF: >50 RBC/hpf (ref 0–5)
Squamous Epithelial / HPF: NONE SEEN /[HPF] (ref 0–5)

## 2022-10-31 LAB — COMPREHENSIVE METABOLIC PANEL
ALT: 15 U/L (ref 0–44)
AST: 13 U/L — ABNORMAL LOW (ref 15–41)
Albumin: 3.7 g/dL (ref 3.5–5.0)
Alkaline Phosphatase: 81 U/L (ref 38–126)
Anion gap: 10 (ref 5–15)
BUN: 11 mg/dL (ref 6–20)
CO2: 26 mmol/L (ref 22–32)
Calcium: 8.5 mg/dL — ABNORMAL LOW (ref 8.9–10.3)
Chloride: 104 mmol/L (ref 98–111)
Creatinine, Ser: 0.62 mg/dL (ref 0.44–1.00)
GFR, Estimated: >60 mL/min (ref >60)
Glucose, Bld: 101 mg/dL — ABNORMAL HIGH (ref 70–99)
Potassium: 3.6 mmol/L (ref 3.5–5.1)
Sodium: 140 mmol/L (ref 135–145)
Total Bilirubin: 0.7 mg/dL (ref 0.3–1.2)
Total Protein: 5.7 g/dL — ABNORMAL LOW (ref 6.5–8.1)

## 2022-10-31 MED ORDER — IOHEXOL 300 MG/ML  SOLN
100.0000 mL | Freq: Once | INTRAMUSCULAR | Status: AC | PRN
  Administered 2022-10-31: 100 mL via INTRAVENOUS

## 2022-10-31 MED ORDER — HEPARIN SOD (PORK) LOCK FLUSH 100 UNIT/ML IV SOLN
INTRAVENOUS | Status: AC
  Administered 2022-10-31: 500 [IU]
  Filled 2022-10-31: qty 5

## 2022-10-31 MED ORDER — SODIUM CHLORIDE 0.9 % IV SOLN
1.0000 g | Freq: Once | INTRAVENOUS | Status: AC
  Administered 2022-10-31: 1 g via INTRAVENOUS
  Filled 2022-10-31: qty 10

## 2022-10-31 MED ORDER — CEPHALEXIN 500 MG PO CAPS: 500.0000 mg | ORAL_CAPSULE | Freq: Four times a day (QID) | ORAL | 0 refills | Status: AC

## 2022-10-31 MED ORDER — LIDOCAINE-PRILOCAINE 2.5-2.5 % EX CREA
TOPICAL_CREAM | Freq: Once | CUTANEOUS | Status: AC
  Filled 2022-10-31: qty 5

## 2022-10-31 MED ORDER — CEPHALEXIN 500 MG PO CAPS
1000.0000 mg | ORAL_CAPSULE | Freq: Once | ORAL | Status: AC
  Administered 2022-10-31: 1000 mg via ORAL
  Filled 2022-10-31: qty 2

## 2022-10-31 NOTE — ED Notes (Signed)
EMLA cream applied to port

## 2022-10-31 NOTE — Discharge Instructions (Addendum)
It appears you have a urinary tract infection.  We are prescribing antibiotics.  Please take them as prescribed for the full course.  Please return if develop fevers, chills, nausea, vomiting, abdominal pain/flank pain, further blood in urine, stop urinating altogether or you develop any new or worsening symptoms that are concerning to you.  Your ct scan showed :" IMPRESSION:  1. Findings compatible with cystitis and ascending urinary tract  infection/ureteritis. Correlate clinically and with urinalysis. No  nephroureterolithiasis or hydronephrosis.  2. Well-circumscribed hypoattenuating heterogeneous mass in the left  adnexa measuring up to 8.5 cm. The ovaries are not  distinctly/separately seen. Recommend further evaluation with  contrast-enhanced pelvic MRI.  3. Trace amount of fluid in the right paracolic gutter, likely  reactive.  4. The endometrium measures up to 5-6 mm. Correlate with patient's  menstrual status. If the patient is postmenopausal, recommend  further evaluation with pelvic ultrasound.  "  Please follow up with your primary doctor and olocologist for further testing/images.

## 2022-10-31 NOTE — Telephone Encounter (Signed)
Patient called reporting that she was in the ER all morning and had CT done and that she has a mass in her pelvis. She also has been put on antibiotics for blood in her urine and infection. She states that she has chemotherapy scheduled for Monday and would like to speak with Dr Smith Robert today if she would call her [phone].  IMPRESSION: 1. Findings compatible with cystitis and ascending urinary tract infection/ureteritis. Correlate clinically and with urinalysis. No nephroureterolithiasis or hydronephrosis. 2. Well-circumscribed hypoattenuating heterogeneous mass in the left adnexa measuring up to 8.5 cm. The ovaries are not distinctly/separately seen. Recommend further evaluation with contrast-enhanced pelvic MRI. 3. Trace amount of fluid in the right paracolic gutter, likely reactive. 4. The endometrium measures up to 5-6 mm. Correlate with patient's menstrual status. If the patient is postmenopausal, recommend further evaluation with pelvic ultrasound. 5. Multiple other nonacute observations, as described above.     Electronically Signed   By: Jules Schick M.D.   On: 10/31/2022 09:3

## 2022-10-31 NOTE — ED Triage Notes (Signed)
Pt c/o of urinary symptoms that started last night. This morning around 0430 pt noticed blood in their urine. Urine sample provided and is solid red in color. Pt's last chemo tx was 9/30

## 2022-10-31 NOTE — ED Provider Notes (Signed)
Received handoff from Dr. Clayborne Dana; disposition pending labs and CT scan for hematuria in the setting of cancer  Physical Exam  BP 120/61   Pulse 87   Temp 98.4 F (36.9 C) (Oral)   Resp 18   Ht 5\' 3"  (1.6 m)   Wt 41.3 kg   LMP 07/05/2012   SpO2 97%   BMI 16.12 kg/m   Physical Exam Vitals reviewed.  HENT:     Head: Normocephalic.     Nose: Nose normal.     Mouth/Throat:     Mouth: Mucous membranes are moist.  Cardiovascular:     Rate and Rhythm: Normal rate and regular rhythm.  Pulmonary:     Effort: Pulmonary effort is normal.  Abdominal:     General: Abdomen is flat. There is no distension.  Skin:    General: Skin is warm.     Capillary Refill: Capillary refill takes less than 2 seconds.  Neurological:     Mental Status: She is alert and oriented to person, place, and time.  Psychiatric:        Mood and Affect: Mood normal.        Behavior: Behavior normal.     Procedures  Procedures  ED Course / MDM   Clinical Course as of 10/31/22 1209  Thu Oct 31, 2022  1610 CT ABDOMEN PELVIS W CONTRAST IMPRESSION: 1. Findings compatible with cystitis and ascending urinary tract infection/ureteritis. Correlate clinically and with urinalysis. No nephroureterolithiasis or hydronephrosis. 2. Well-circumscribed hypoattenuating heterogeneous mass in the left adnexa measuring up to 8.5 cm. The ovaries are not distinctly/separately seen. Recommend further evaluation with contrast-enhanced pelvic MRI. 3. Trace amount of fluid in the right paracolic gutter, likely reactive. 4. The endometrium measures up to 5-6 mm. Correlate with patient's menstrual status. If the patient is postmenopausal, recommend further evaluation with pelvic ultrasound.   [TY]  1205 Patient feeling improved.  Has not had any more blood in urine.  Reports dysuria improved as well. [TY]    Clinical Course User Index [TY] Coral Spikes, DO   Medical Decision Making 57 year old female presenting  emergency department with hematuria and dysuria this morning received signout from Dr. Clayborne Dana, see his note for full HPI.  Patient vitals reviewed, no fever, tachycardia.  Has been hemodynamically stable while undergoing workup.  Appear to have frank hematuria on specimen, was treated by prior team with Keflex.  Does have a leukocytosis, appears slightly worse than prior hemoglobin roughly a week and a half ago. Clinically well appearing.  She has no metabolic derangements.  Normal kidney function.  Her hemoglobin is stable at 11.5.  CT scan of abdomen with findings consistent with cystitis also has incidental findings as noted in the ED course.  Patient made aware of findings and need for further follow-up.  Was given dose of Rocephin.  She has appointment with primary doctor on Monday.  Patient feels well enough to go home and will return if she develops any fevers, chills, nausea vomiting, abdominal pain or any new or worsening symptoms.  Amount and/or Complexity of Data Reviewed Labs: ordered. Radiology: ordered. Decision-making details documented in ED Course.  Risk Prescription drug management.         Coral Spikes, DO 10/31/22 1210

## 2022-10-31 NOTE — ED Notes (Signed)
Pt requesting port to be numbed before access

## 2022-10-31 NOTE — Telephone Encounter (Signed)
Spoke with Dr. Smith Robert. Pt will need a stat mri at [facility]. Pt agreeable. Unable to schedule this for Friday 10/18 due to a dental apt. Ok to set up for early next week.

## 2022-10-31 NOTE — ED Notes (Signed)
Pt ambulating to restroom at this time.

## 2022-11-01 ENCOUNTER — Other Ambulatory Visit: Payer: Self-pay | Admitting: Oncology

## 2022-11-01 DIAGNOSIS — Z170 Estrogen receptor positive status [ER+]: Secondary | ICD-10-CM

## 2022-11-01 NOTE — Telephone Encounter (Signed)
Mri sch. On 10/21

## 2022-11-02 LAB — URINE CULTURE: Culture: 40000 — AB

## 2022-11-02 NOTE — ED Provider Notes (Signed)
[place] EMERGENCY DEPARTMENT AT [facility] Provider Note   CSN: [id number] Arrival date & time: 10/31/22  0453     History  Chief Complaint  Patient presents with   Hematuria    Melissa Rodriguez is a 57 y.o. female.  57 year old female with a history of breast cancer status post lumpectomy greater than 15 years ago but then recurrence status post mastectomy this summer currently on chemo the presents ER today with lower abdominal pain.  Had some dysuria and increased frequency last night but then hematuria this morning.  Suprapubic pain.  No fevers, vomiting but has been a bit nauseous.  She thinks this might be related to the chemo even though that was a couple weeks ago.   Hematuria       Home Medications Prior to Admission medications   Medication Sig Start Date End Date Taking? Authorizing Provider  cephALEXin (KEFLEX) 500 MG capsule Take 1 capsule (500 mg total) by mouth 4 (four) times daily for 10 days. 10/31/22 11/10/22 Yes Young, Feliz Beam J, DO  aluminum-magnesium hydroxide 200-200 MG/5ML suspension Take 15 mLs by mouth every 6 (six) hours as needed for indigestion. Mix 1:1 with Lidocaine and swish and spit 4 times a day 10/21/22   Doreatha Massed, MD  Bioflavonoid Products (GRAPE SEED PO) Take 1 capsule by mouth daily.    [provider]  Calcium Carbonate Antacid (TUMS PO) Take 1 tablet by mouth as needed. Reported on 05/22/2015    [provider]  CARBOPLATIN IV Inject into the vein every 21 ( twenty-one) days. 10/14/22   [provider]  Cholecalciferol (VITAMIN D PO) Take 1,000 Units by mouth daily.     [provider]  diphenoxylate-atropine (LOMOTIL) 2.5-0.025 MG tablet Take 1 tablet by mouth 4 (four) times daily as needed for diarrhea or loose stools. 10/21/22   Doreatha Massed, MD  DOCETAXEL IV Inject into the vein every 21 ( twenty-one) days. 10/14/22   [provider]  lidocaine (XYLOCAINE) 2 % solution  Use as directed 15 mLs in the mouth or throat 4 (four) times daily. Mix 1:1 with maalox and swish and spit 4 times a day 10/21/22   Doreatha Massed, MD  lidocaine-prilocaine (EMLA) cream Apply a quarter-sized amount to port a cath site and cover with plastic wrap 1 hour prior to infusion appointments 10/03/22   Doreatha Massed, MD  montelukast (SINGULAIR) 10 MG tablet Take one 10mg  tab for 2 days before chemo (day 1 and day 2) 10/25/22   Creig Hines, MD  PERTUZUMAB IV Inject into the vein every 21 ( twenty-one) days. 10/14/22   [provider]  predniSONE (DELTASONE) 20 MG tablet Take one 20mg  tab for 2 days before chemo (day 1 and day 2) 10/25/22   Creig Hines, MD  TRASTUZUMAB IV Inject into the vein every 21 ( twenty-one) days. 10/14/22   [provider]      Allergies    Benadryl [diphenhydramine hcl], Diphenhydramine hcl, Hydrocodone, Penicillins, Ciprofloxacin, Docetaxel, and Trastuzumab-anns [trastuzumab-pkrb]    Review of Systems   Review of Systems  Genitourinary:  Positive for hematuria.    Physical Exam Updated Vital Signs BP 120/61   Pulse 87   Temp 98.4 F (36.9 C) (Oral)   Resp 18   Ht 5\' 3"  (1.6 m)   Wt 41.3 kg   LMP 07/05/2012   SpO2 97%   BMI 16.12 kg/m  Physical Exam Vitals and nursing note reviewed.  Constitutional:  Appearance: She is well-developed.  HENT:     Head: Normocephalic and atraumatic.  Eyes:     Pupils: Pupils are equal, round, and reactive to light.  Cardiovascular:     Rate and Rhythm: Normal rate and regular rhythm.  Pulmonary:     Effort: No respiratory distress.     Breath sounds: No stridor.  Abdominal:     General: There is no distension.     Tenderness: There is abdominal tenderness (Suprapubic, no CVA).  Musculoskeletal:     Cervical back: Normal range of motion.  Skin:    General: Skin is warm and dry.  Neurological:     Mental Status: She is alert.     ED Results / Procedures / Treatments    Labs (all labs ordered are listed, but only abnormal results are displayed) Labs Reviewed  URINE CULTURE - Abnormal; Notable for the following components:      Result Value   Culture 40,000 COLONIES/mL ESCHERICHIA COLI (*)    All other components within normal limits  URINALYSIS, ROUTINE W REFLEX MICROSCOPIC - Abnormal; Notable for the following components:   Color, Urine RED (*)    APPearance TURBID (*)    Glucose, UA   (*)    Value: TEST NOT REPORTED DUE TO COLOR INTERFERENCE OF URINE PIGMENT   Hgb urine dipstick   (*)    Value: TEST NOT REPORTED DUE TO COLOR INTERFERENCE OF URINE PIGMENT   Bilirubin Urine   (*)    Value: TEST NOT REPORTED DUE TO COLOR INTERFERENCE OF URINE PIGMENT   Ketones, ur   (*)    Value: TEST NOT REPORTED DUE TO COLOR INTERFERENCE OF URINE PIGMENT   Protein, ur   (*)    Value: TEST NOT REPORTED DUE TO COLOR INTERFERENCE OF URINE PIGMENT   Nitrite   (*)    Value: TEST NOT REPORTED DUE TO COLOR INTERFERENCE OF URINE PIGMENT   Leukocytes,Ua   (*)    Value: TEST NOT REPORTED DUE TO COLOR INTERFERENCE OF URINE PIGMENT   All other components within normal limits  URINALYSIS, MICROSCOPIC (REFLEX) - Abnormal; Notable for the following components:   Bacteria, UA RARE (*)    All other components within normal limits  CBC WITH DIFFERENTIAL/PLATELET - Abnormal; Notable for the following components:   WBC 23.9 (*)    Hemoglobin 11.5 (*)    HCT 34.3 (*)    Neutro Abs 21.7 (*)    Abs Immature Granulocytes 0.34 (*)    All other components within normal limits  COMPREHENSIVE METABOLIC PANEL - Abnormal; Notable for the following components:   Glucose, Bld 101 (*)    Calcium 8.5 (*)    Total Protein 5.7 (*)    AST 13 (*)    All other components within normal limits    EKG None  Radiology CT ABDOMEN PELVIS W CONTRAST  Result Date: 10/31/2022 CLINICAL DATA:  Abdominal/flank pain, stone suspected. EXAM: CT ABDOMEN AND PELVIS WITH CONTRAST TECHNIQUE:  Multidetector CT imaging of the abdomen and pelvis was performed using the standard protocol following bolus administration of intravenous contrast. RADIATION DOSE REDUCTION: This exam was performed according to the departmental dose-optimization program which includes automated exposure control, adjustment of the mA and/or kV according to patient size and/or use of iterative reconstruction technique. CONTRAST:  OMNIPAQUE IOHEXOL 300 MG/ML  SOLN COMPARISON:  CT scan renal stone protocol from 02/22/2018. FINDINGS: Lower chest: Patchy atelectasis/scarring noted in the inferior lingula. The lung bases are  otherwise clear. No pleural effusion. The heart is normal in size. No pericardial effusion. Hepatobiliary: The liver is normal in size. Non-cirrhotic configuration. No suspicious mass. No intrahepatic or extrahepatic bile duct dilation. No calcified gallstones. Normal gallbladder wall thickness. No pericholecystic inflammatory changes. Pancreas: Unremarkable. No pancreatic ductal dilatation or surrounding inflammatory changes. Spleen: Within normal limits. No focal lesion. Adrenals/Urinary Tract: Adrenal glands are unremarkable. There are several subcentimeter hypoattenuating foci bilateral kidneys, which are too small to adequately characterize. No hydronephrosis or hydroureter. However, note is made of mild right urothelial thickening and mild thickening/hyperattenuating walls of bilateral visualized upper ureters. Findings are nonspecific but can be seen with urinary tract infection/ureteritis. Correlate clinically and with urinalysis. No nephroureterolithiasis on either side. There is mild circumferential thickening of the urinary bladder which is hyperattenuating and there is mild perivesical fat stranding, compatible with cystitis. No focal bladder mass or bladder calculi. Stomach/Bowel: No disproportionate dilation of the small or large bowel loops. No evidence of abnormal bowel wall thickening or  inflammatory changes. Portion of appendix is seen in the right lower quadrant and appear unremarkable. However, rest of the appendix is not distinctly separated from other bowel loops. There are no inflammatory changes in the right lower quadrant. Vascular/Lymphatic: No pneumoperitoneum. There is trace amount of fluid in the right paracolic gutter. No walled-off abscess. No abdominal or pelvic lymphadenopathy, by size criteria. No aneurysmal dilation of the major abdominal arteries. Reproductive: Normal-size anteverted uterus. No focal mass. The endometrium measures up to 5-6 mm. No focal mass. There is a well-circumscribed hypoattenuating heterogeneous mass in the left adnexa measuring approximately 7.2 x 8.5 cm orthogonally on axial plane. The ovaries are not distinctly/separately seen. Other: There is a tiny fat containing umbilical hernia. The soft tissues and abdominal wall are otherwise unremarkable. Musculoskeletal: No suspicious osseous lesions. IMPRESSION: 1. Findings compatible with cystitis and ascending urinary tract infection/ureteritis. Correlate clinically and with urinalysis. No nephroureterolithiasis or hydronephrosis. 2. Well-circumscribed hypoattenuating heterogeneous mass in the left adnexa measuring up to 8.5 cm. The ovaries are not distinctly/separately seen. Recommend further evaluation with contrast-enhanced pelvic MRI. 3. Trace amount of fluid in the right paracolic gutter, likely reactive. 4. The endometrium measures up to 5-6 mm. Correlate with patient's menstrual status. If the patient is postmenopausal, recommend further evaluation with pelvic ultrasound. 5. Multiple other nonacute observations, as described above. Electronically Signed   By: Jules Schick M.D.   On: 10/31/2022 09:37    Procedures Procedures    Medications Ordered in ED Medications  cephALEXin (KEFLEX) capsule 1,000 mg (1,000 mg Oral Given 10/31/22 0548)  lidocaine-prilocaine (EMLA) cream ( Topical Given  10/31/22 0610)  iohexol (OMNIPAQUE) 300 MG/ML solution 100 mL (100 mLs Intravenous Contrast Given 10/31/22 0835)  cefTRIAXone (ROCEPHIN) 1 g in sodium chloride 0.9 % 100 mL IVPB (0 g Intravenous Stopped 10/31/22 1221)  heparin lock flush 100 UNIT/ML injection (500 Units  Given 10/31/22 1221)    ED Course/ Medical Decision Making/ A&P Clinical Course as of 11/02/22 0201  Thu Oct 31, 2022  2376 CT ABDOMEN PELVIS W CONTRAST IMPRESSION: 1. Findings compatible with cystitis and ascending urinary tract infection/ureteritis. Correlate clinically and with urinalysis. No nephroureterolithiasis or hydronephrosis. 2. Well-circumscribed hypoattenuating heterogeneous mass in the left adnexa measuring up to 8.5 cm. The ovaries are not distinctly/separately seen. Recommend further evaluation with contrast-enhanced pelvic MRI. 3. Trace amount of fluid in the right paracolic gutter, likely reactive. 4. The endometrium measures up to 5-6 mm. Correlate with patient's menstrual status. If the  patient is postmenopausal, recommend further evaluation with pelvic ultrasound.   [TY]  1205 Patient feeling improved.  Has not had any more blood in urine.  Reports dysuria improved as well. [TY]    Clinical Course User Index [TY] Coral Spikes, DO                                 Medical Decision Making Amount and/or Complexity of Data Reviewed Labs: ordered. Radiology: ordered. Decision-making details documented in ED Course.  Risk Prescription drug management.   Patient with hematuria.  Also other symptoms of infection however the urinalysis is difficult to fully interpret secondary to the mount of hematuria.  As infection is the most common reason for it and she is at risk for will treat.  Secondary to the chemo, history of kidney stones will get CT scan to evaluate for kidney stone versus pyelonephritis or other possible etiologies for her discomfort.  Care transferred pending imaging and further  workup/disposition.   Final Clinical Impression(s) / ED Diagnoses Final diagnoses:  Pelvic mass  Acute cystitis with hematuria    Rx / DC Orders ED Discharge Orders          Ordered    cephALEXin (KEFLEX) 500 MG capsule  4 times daily        10/31/22 1211              Vee Bahe, Barbara Cower, MD 11/02/22 [id number]

## 2022-11-03 ENCOUNTER — Telehealth (HOSPITAL_BASED_OUTPATIENT_CLINIC_OR_DEPARTMENT_OTHER): Payer: Self-pay | Admitting: *Deleted

## 2022-11-03 NOTE — Telephone Encounter (Signed)
Post ED Visit - Positive Culture Follow-up  Culture report reviewed by antimicrobial stewardship pharmacist: [facility] []  Enzo Bi, Pharm.D. []  Celedonio Miyamoto, Pharm.D., BCPS AQ-ID []  Garvin Fila, Pharm.D., BCPS []  Georgina Pillion, Pharm.D., BCPS []  [place], [address].D., BCPS, AAHIVP []  Estella Husk, Pharm.D., BCPS, AAHIVP []  Lysle Pearl, PharmD, BCPS []  Phillips Climes, PharmD, BCPS []  Agapito Games, PharmD, BCPS []  Verlan Friends, PharmD []  Mervyn Gay, PharmD, BCPS [x]  Estill Batten, PharmD  [facility] []  Len Childs, PharmD []  Greer Pickerel, PharmD []  Adalberto Cole, PharmD []  Perlie Gold, Rph []  Lonell Face) Jean Rosenthal, PharmD []  Earl Many, PharmD []  Junita Push, PharmD []  Dorna Leitz, PharmD []  Terrilee Files, PharmD []  Lynann Beaver, PharmD []  Keturah Barre, PharmD []  Loralee Pacas, PharmD []  Bernadene Person, PharmD   Positive urine culture Treated with Cephalexin, organism sensitive to the same and no further patient follow-up is required at this time.  Patsey Berthold 11/03/2022, 12:31 PM

## 2022-11-04 ENCOUNTER — Encounter: Payer: Self-pay | Admitting: Oncology

## 2022-11-04 ENCOUNTER — Ambulatory Visit: Payer: Commercial Managed Care - PPO

## 2022-11-04 ENCOUNTER — Other Ambulatory Visit: Payer: Commercial Managed Care - PPO

## 2022-11-04 ENCOUNTER — Other Ambulatory Visit: Payer: Self-pay | Admitting: Oncology

## 2022-11-04 ENCOUNTER — Inpatient Hospital Stay: Payer: Commercial Managed Care - PPO

## 2022-11-04 ENCOUNTER — Inpatient Hospital Stay: Payer: Commercial Managed Care - PPO | Admitting: Oncology

## 2022-11-04 ENCOUNTER — Encounter (HOSPITAL_COMMUNITY): Payer: Self-pay

## 2022-11-04 ENCOUNTER — Ambulatory Visit (HOSPITAL_COMMUNITY)
Admission: RE | Admit: 2022-11-04 | Discharge: 2022-11-04 | Disposition: A | Discharge status: Home / Self Care | Payer: Commercial Managed Care - PPO | Source: Ambulatory Visit | Attending: Oncology | Admitting: Oncology

## 2022-11-04 DIAGNOSIS — R1900 Intra-abdominal and pelvic swelling, mass and lump, unspecified site: Secondary | ICD-10-CM

## 2022-11-04 DIAGNOSIS — Z1509 Genetic susceptibility to other malignant neoplasm: Secondary | ICD-10-CM | POA: Diagnosis present

## 2022-11-04 DIAGNOSIS — Z1501 Genetic susceptibility to malignant neoplasm of breast: Secondary | ICD-10-CM

## 2022-11-04 MED ORDER — GADOBUTROL 1 MMOL/ML IV SOLN
4.0000 mL | Freq: Once | INTRAVENOUS | Status: AC | PRN
  Administered 2022-11-04: 4 mL via INTRAVENOUS

## 2022-11-05 ENCOUNTER — Telehealth: Payer: Self-pay | Admitting: *Deleted

## 2022-11-05 ENCOUNTER — Inpatient Hospital Stay: Payer: Commercial Managed Care - PPO

## 2022-11-05 ENCOUNTER — Inpatient Hospital Stay: Payer: Commercial Managed Care - PPO | Admitting: Hematology

## 2022-11-05 ENCOUNTER — Encounter: Payer: Self-pay | Admitting: Oncology

## 2022-11-05 NOTE — Telephone Encounter (Signed)
Patient called asking about her MRI results, then Radiology called with a called report on her MRI  IMPRESSION: 1. Large, lobulated left ovarian mass measuring 7.9 x 7.0 x 5.9 cm. This is heterogeneously T2 hypointense with irregular regions of internal T2 hyperintensity and poorly enhancing. 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 In retrospective review, this lesion was present on remote prior CT dated 02/22/2018 and has significantly enlarged in size over the interval, demonstrating relatively indolent behavior. This is however nonetheless technically characterized as an O-RADS category 4 lesion and remains suspicious for solid malignancy. Recommend surgical referral. 2. Small volume free fluid throughout the pelvis. 3. No evidence of lymphadenopathy or metastatic disease in the pelvis.   These results will be called to the ordering clinician or representative by the Radiologist Assistant, and communication documented in the PACS or [employer].     Electronically Signed   By: Jearld Lesch M.D.   On: 11/04/2022 18:52

## 2022-11-07 ENCOUNTER — Inpatient Hospital Stay: Payer: Commercial Managed Care - PPO

## 2022-11-07 ENCOUNTER — Encounter: Payer: Self-pay | Admitting: *Deleted

## 2022-11-08 ENCOUNTER — Inpatient Hospital Stay: Payer: Commercial Managed Care - PPO

## 2022-11-08 ENCOUNTER — Encounter: Payer: Self-pay | Admitting: Oncology

## 2022-11-08 ENCOUNTER — Ambulatory Visit: Payer: Commercial Managed Care - PPO | Admitting: Oncology

## 2022-11-08 ENCOUNTER — Inpatient Hospital Stay (HOSPITAL_BASED_OUTPATIENT_CLINIC_OR_DEPARTMENT_OTHER): Payer: Commercial Managed Care - PPO | Admitting: Oncology

## 2022-11-08 ENCOUNTER — Other Ambulatory Visit: Payer: Commercial Managed Care - PPO

## 2022-11-08 VITALS — BP 130/70 | HR 87 | Temp 97.6°F | Resp 18 | Ht 63.0 in | Wt 91.4 lb

## 2022-11-08 DIAGNOSIS — Z5112 Encounter for antineoplastic immunotherapy: Secondary | ICD-10-CM

## 2022-11-08 DIAGNOSIS — N838 Other noninflammatory disorders of ovary, fallopian tube and broad ligament: Secondary | ICD-10-CM | POA: Diagnosis not present

## 2022-11-08 DIAGNOSIS — Z170 Estrogen receptor positive status [ER+]: Secondary | ICD-10-CM

## 2022-11-08 DIAGNOSIS — Z5111 Encounter for antineoplastic chemotherapy: Secondary | ICD-10-CM | POA: Diagnosis not present

## 2022-11-08 DIAGNOSIS — C50412 Malignant neoplasm of upper-outer quadrant of left female breast: Secondary | ICD-10-CM

## 2022-11-08 DIAGNOSIS — Z95828 Presence of other vascular implants and grafts: Secondary | ICD-10-CM

## 2022-11-08 LAB — CBC WITH DIFFERENTIAL (CANCER CENTER ONLY)
Abs Immature Granulocytes: 0.04 10*3/uL (ref 0.00–0.07)
Basophils Absolute: 0 10*3/uL (ref 0.0–0.1)
Basophils Relative: 0 %
Eosinophils Absolute: 0 10*3/uL (ref 0.0–0.5)
Eosinophils Relative: 0 %
HCT: 34.9 % — ABNORMAL LOW (ref 36.0–46.0)
Hemoglobin: 11.5 g/dL — ABNORMAL LOW (ref 12.0–15.0)
Immature Granulocytes: 0 %
Lymphocytes Relative: 13 %
Lymphs Abs: 1.3 10*3/uL (ref 0.7–4.0)
MCH: 29.1 pg (ref 26.0–34.0)
MCHC: 33 g/dL (ref 30.0–36.0)
MCV: 88.4 fL (ref 80.0–100.0)
Monocytes Absolute: 0.5 10*3/uL (ref 0.1–1.0)
Monocytes Relative: 6 %
Neutro Abs: 7.6 10*3/uL (ref 1.7–7.7)
Neutrophils Relative %: 81 %
Platelet Count: 270 10*3/uL (ref 150–400)
RBC: 3.95 MIL/uL (ref 3.87–5.11)
RDW: 14 % (ref 11.5–15.5)
WBC Count: 9.5 10*3/uL (ref 4.0–10.5)
nRBC: 0 % (ref 0.0–0.2)

## 2022-11-08 LAB — CMP (CANCER CENTER ONLY)
ALT: 13 U/L (ref 0–44)
AST: 16 U/L (ref 15–41)
Albumin: 4 g/dL (ref 3.5–5.0)
Alkaline Phosphatase: 57 U/L (ref 38–126)
Anion gap: 8 (ref 5–15)
BUN: 16 mg/dL (ref 6–20)
CO2: 25 mmol/L (ref 22–32)
Calcium: 9 mg/dL (ref 8.9–10.3)
Chloride: 100 mmol/L (ref 98–111)
Creatinine: 0.63 mg/dL (ref 0.44–1.00)
GFR, Estimated: >60 mL/min (ref >60)
Glucose, Bld: 103 mg/dL — ABNORMAL HIGH (ref 70–99)
Potassium: 3.9 mmol/L (ref 3.5–5.1)
Sodium: 133 mmol/L — ABNORMAL LOW (ref 135–145)
Total Bilirubin: 0.5 mg/dL (ref 0.3–1.2)
Total Protein: 6.2 g/dL — ABNORMAL LOW (ref 6.5–8.1)

## 2022-11-08 MED ORDER — SODIUM CHLORIDE 0.9% FLUSH
10.0000 mL | Freq: Once | INTRAVENOUS | Status: AC
  Administered 2022-11-08: 10 mL via INTRAVENOUS
  Filled 2022-11-08: qty 10

## 2022-11-08 MED ORDER — HEPARIN SOD (PORK) LOCK FLUSH 100 UNIT/ML IV SOLN
500.0000 [IU] | Freq: Once | INTRAVENOUS | Status: AC
Start: 2022-11-08 — End: 2022-11-08
  Administered 2022-11-08: 500 [IU] via INTRAVENOUS
  Filled 2022-11-08: qty 5

## 2022-11-08 MED FILL — Fosaprepitant Dimeglumine For IV Infusion 150 MG (Base Eq): INTRAVENOUS | Qty: 5 | Status: AC

## 2022-11-10 ENCOUNTER — Encounter: Payer: Self-pay | Admitting: Oncology

## 2022-11-10 NOTE — Progress Notes (Signed)
Hematology/Oncology Consult note [facility]  Telephone:([phone] [phone]  Patient Care Team: Babs Sciara, MD as PCP - General (Family Medicine) Doreatha Massed, MD as Medical Oncologist (Medical Oncology) Therese Sarah, RN as Oncology Nurse Navigator (Medical Oncology) Creig Hines, MD as Consulting Physician (Oncology)   Name of the patient: Melissa Rodriguez  [id number]  02/01/1965   Date of visit: 11/10/22  Diagnosis- pathologic prognostic stage Ib invasive mammary carcinoma of the left breast T2 N1 M0 ER/PR positive HER2 positive    Chief complaint/ Reason for visit-on treatment assessment prior to cycle 2 of adjuvant TCHP chemotherapy  Discussed MRI results and further management  Heme/Onc history:  Patient is a 57 year old female with a prior history of lumpectomy in 2008 which showed a 7 mm stage I ER/PR positive HER2 negative left breast cancer.  She underwent lumpectomy and adjuvant radiation therapy but did not go through endocrine therapy.   She self palpated a left breast lump led to a diagnostic mammogram in July 2024.  Diagnostic mammogram showed 1.9 cm irregular hypoechoic mass 1 o'clock position 7 cm from the breast grade 3 ER 99% positive, PR 85% positive and HER2 equivocal by IHC and FISH positive.  Ki-67 35% there were calcifications noted in the right breast as well which were biopsy-proven benign.     She underwent genetic testing and was found to have BRCA2 mutation   Patient was seen by Dr. Ellin Saba and he recommended upfront surgery and consideration for adjuvant chemotherapy.  Patient underwent bilateral mastectomy on 08/15/2022.  No evidence of malignancy in the right breast.  There were 2 distinct masses noted in the left breast with dominant mass measured 2.4 x 2.3 x 1.6 cm grade 3 with negative margins.  There was another 1 cm mass where there was uptake of radiotracer dye with positive posterior lateral  anterior margin.  This was believed to be the sentinel lymph node although on pathology it states that there were no lymph nodes identified likely due to prior lumpectomy. mpT2.  She was staged as T2 N1 triple positive disease.   She was recommended adjuvant TCHP chemotherapy and received cycle 1 on 10/14/2022    Interval history-patient was in the ER for symptoms of flank pain and underwent CT abdomen which incidentally showed left adnexal mass.  She will be on antibiotics for 2 more days.  She reports some mild chronic fatigue but denies other complaints  ECOG PS- 1 Pain scale- 0   Review of systems- Review of Systems  Constitutional:  Positive for malaise/fatigue. Negative for chills, fever and weight loss.  HENT:  Negative for congestion, ear discharge and nosebleeds.   Eyes:  Negative for blurred vision.  Respiratory:  Negative for cough, hemoptysis, sputum production, shortness of breath and wheezing.   Cardiovascular:  Negative for chest pain, palpitations, orthopnea and claudication.  Gastrointestinal:  Negative for abdominal pain, blood in stool, constipation, diarrhea, heartburn, melena, nausea and vomiting.  Genitourinary:  Negative for dysuria, flank pain, frequency, hematuria and urgency.  Musculoskeletal:  Negative for back pain, joint pain and myalgias.  Skin:  Negative for rash.  Neurological:  Negative for dizziness, tingling, focal weakness, seizures, weakness and headaches.  Endo/Heme/Allergies:  Does not bruise/bleed easily.  Psychiatric/Behavioral:  Negative for depression and suicidal ideas. The patient does not have insomnia.       Allergies  Allergen Reactions   Benadryl [Diphenhydramine Hcl]     Jittery and Hallucinations  Diphenhydramine Hcl Anxiety    Other Reaction(s): Hallucination  Jittery and Hallucinations    Made her feel like she was crawling the walls.   Hydrocodone Shortness Of Breath    Felt like she could not breathe-February 2020    Penicillins Hives and Rash    Did it involve swelling of the face/tongue/throat, SOB, or low BP? No  Did it involve sudden or severe rash/hives, skin peeling, or any reaction on the inside of your mouth or nose? No  Did you need to seek medical attention at a hospital or doctor's office? No  When did it last happen?        If all above answers are "NO", may proceed with cephalosporin use.  Any medicine related to PCN    Did it involve swelling of the face/tongue/throat, SOB, or low BP? No Did it involve sudden or severe rash/hives, skin peeling, or any reaction on the inside of your mouth or nose? No Did you need to seek medical attention at a hospital or doctor's office? No When did it last happen?       If all above answers are "NO", may proceed with cephalosporin use.   Ciprofloxacin Hives   Docetaxel Other (See Comments)    Tingling of lips, teeth hurting, hot tongue   Trastuzumab-Anns [Trastuzumab-Pkrb] Itching    Tingling and hot tongue and lips     Past Medical History:  Diagnosis Date   Adenocarcinoma of breast (HCC) 05/20/2011   Stage I (T1b N0), grade 1, well-differentiated adenocarcinoma of the medial lower quadrant of the left breast. It was a tubular carcinoma, status post lumpectomy on 03/13/2006, ER positive 79%, PR positive 92%, HER2/neu negative, Ki-67 marker low at 18%. Her cancer was 7 mm in size, and she had 4 sentinel nodes all negative along with her lumpectomy. She was treated with radiation therapy postoper   Complication of anesthesia    pt states her bladder did not wake up after one surgery. Went to ER, had a urinary catheter for 1 week   History of kidney stones    Iron deficiency    Iron deficiency anemia 05/20/2011   Secondary to GU bleeding.  Good absorber of PO iron   Personal history of radiation therapy      Past Surgical History:  Procedure Laterality Date   BREAST BIOPSY Left 07/16/2022   [place] LT BREAST BX W LOC DEV 1ST LESION IMG BX SPEC US  GUIDE 07/16/2022 Edwin Cap, MD [facility]   BREAST BIOPSY Right 07/26/2022   MM RT BREAST BX W LOC DEV 1ST LESION IMAGE BX SPEC STEREO GUIDE 07/26/2022 [facility]   BREAST LUMPECTOMY Left    EXTRACORPOREAL SHOCK WAVE LITHOTRIPSY Left 02/26/2018   Procedure: EXTRACORPOREAL SHOCK WAVE LITHOTRIPSY (ESWL);  Surgeon: Malen Gauze, MD;  Location: [facility];  Service: Urology;  Laterality: Left;   LUMPECTOM LEFT BREAST  02/2006   LYMPH NODE BIOPSY LEFT  04/2006   MASTECTOMY W/ SENTINEL NODE BIOPSY Left 08/15/2022   Procedure: LEFT SIMPLE MASTECTOMY, LEFT SENTINEL LYMPH NODE MAPPING;  Surgeon: Harriette Bouillon, MD;  Location: [facility];  Service: General;  Laterality: Left;   PORTACATH PLACEMENT N/A 10/09/2022   Procedure: INSERTION PORT-A-CATH;  Surgeon: Harriette Bouillon, MD;  Location: [facility];  Service: General;  Laterality: N/A;   SIMPLE MASTECTOMY WITH AXILLARY SENTINEL NODE BIOPSY Right 08/15/2022   Procedure: RIGHT RISK REDUCING MASTECTOMY;  Surgeon: Harriette Bouillon, MD;  Location: [facility];  Service: General;  Laterality: Right;    Social History   Socioeconomic History   Marital status: Married    Spouse name: Not on file   Number of children: Not on file   Years of education: Not on file   Highest education level: Some college, no degree  Occupational History   Not on file  Tobacco Use   Smoking status: Never   Smokeless tobacco: Never  Vaping Use   Vaping status: Never Used  Substance and Sexual Activity   Alcohol use: No   Drug use: No   Sexual activity: Yes    Birth control/protection: Post-menopausal  Other Topics Concern   Not on file  Social History Narrative   Not on file   Social Determinants of Health   Financial Resource Strain: Patient Declined (10/16/2022)   Overall Financial Resource Strain (CARDIA)    Difficulty of Paying Living Expenses: Patient declined  Recent Concern: Physicist, medical Strain  - High Risk (10/03/2022)   Overall Financial Resource Strain (CARDIA)    Difficulty of Paying Living Expenses: Hard  Food Insecurity: No Food Insecurity (10/16/2022)   Hunger Vital Sign    Worried About Running Out of Food in the Last Year: Never true    Ran Out of Food in the Last Year: Never true  Transportation Needs: No Transportation Needs (10/16/2022)   PRAPARE - Administrator, Civil Service (Medical): No    Lack of Transportation (Non-Medical): No  Physical Activity: Insufficiently Active (10/16/2022)   Exercise Vital Sign    Days of Exercise per Week: 1 day    Minutes of Exercise per Session: 20 min  Stress: No Stress Concern Present (10/16/2022)   [employer] of Occupational Health - Occupational Stress Questionnaire    Feeling of Stress : Not at all  Social Connections: Unknown (10/16/2022)   Social Connection and Isolation Panel [NHANES]    Frequency of Communication with Friends and Family: Patient declined    Frequency of Social Gatherings with Friends and Family: Patient declined    Attends Religious Services: More than 4 times per year    Active Member of [employer] or Organizations: Yes    Attends Engineer, structural: More than 4 times per year    Marital Status: Married  Catering manager Violence: Not on file    Family History  Problem Relation Age of Onset   Hypertension Mother    Hypertension Father    Heart disease Father 83       MI in his 19s   Cancer Maternal Aunt        unk type, possibly hip. dx >50   Breast cancer Paternal Aunt        dx 50s   Cancer Paternal Aunt        unknown, d. 22s   Prostate cancer Cousin        dx 40s-50s   Stomach cancer Cousin        dx 40s-50s     Current Outpatient Medications:    aluminum-magnesium hydroxide 200-200 MG/5ML suspension, Take 15 mLs by mouth every 6 (six) hours as needed for indigestion. Mix 1:1 with Lidocaine and swish and spit 4 times a day, Disp: 355 mL, Rfl: 2   Bioflavonoid  Products (GRAPE SEED PO), Take 1 capsule by mouth daily., Disp: , Rfl:    Calcium Carbonate Antacid (TUMS PO), Take 1 tablet by mouth as needed. Reported on 05/22/2015, Disp: , Rfl:    CARBOPLATIN IV, Inject  into the vein every 21 ( twenty-one) days., Disp: , Rfl:    cephALEXin (KEFLEX) 500 MG capsule, Take 1 capsule (500 mg total) by mouth 4 (four) times daily for 10 days., Disp: 40 capsule, Rfl: 0   Cholecalciferol (VITAMIN D PO), Take 1,000 Units by mouth daily. , Disp: , Rfl:    diphenoxylate-atropine (LOMOTIL) 2.5-0.025 MG tablet, Take 1 tablet by mouth 4 (four) times daily as needed for diarrhea or loose stools., Disp: 60 tablet, Rfl: 0   DOCETAXEL IV, Inject into the vein every 21 ( twenty-one) days., Disp: , Rfl:    lidocaine (XYLOCAINE) 2 % solution, Use as directed 15 mLs in the mouth or throat 4 (four) times daily. Mix 1:1 with maalox and swish and spit 4 times a day, Disp: 100 mL, Rfl: 2   lidocaine-prilocaine (EMLA) cream, Apply a quarter-sized amount to port a cath site and cover with plastic wrap 1 hour prior to infusion appointments, Disp: 30 g, Rfl: 3   montelukast (SINGULAIR) 10 MG tablet, Take one 10mg  tab for 2 days before chemo (day 1 and day 2), Disp: 2 tablet, Rfl: 0   PERTUZUMAB IV, Inject into the vein every 21 ( twenty-one) days., Disp: , Rfl:    predniSONE (DELTASONE) 20 MG tablet, Take one 20mg  tab for 2 days before chemo (day 1 and day 2), Disp: 20 tablet, Rfl: 0   TRASTUZUMAB IV, Inject into the vein every 21 ( twenty-one) days., Disp: , Rfl:   Physical exam:  Vitals:   11/08/22 1125 11/08/22 1130  BP: (!) 142/61 130/70  Pulse: 87   Resp: 18   Temp: 97.6 F (36.4 C)   TempSrc: Tympanic   SpO2: 100%   Weight: 91 lb 6.4 oz (41.5 kg)   Height: 5\' 3"  (1.6 m)    Physical Exam Cardiovascular:     Rate and Rhythm: Normal rate and regular rhythm.     Heart sounds: Normal heart sounds.  Pulmonary:     Effort: Pulmonary effort is normal.     Breath sounds: Normal  breath sounds.  Skin:    General: Skin is warm and dry.  Neurological:     Mental Status: She is alert and oriented to person, place, and time.         Latest Ref Rng & Units 11/08/2022   10:50 AM  CMP  Glucose 70 - 99 mg/dL 132   BUN 6 - 20 mg/dL 16   Creatinine 4.40 - 1.00 mg/dL 1.02   Sodium 725 - 366 mmol/L 133   Potassium 3.5 - 5.1 mmol/L 3.9   Chloride 98 - 111 mmol/L 100   CO2 22 - 32 mmol/L 25   Calcium 8.9 - 10.3 mg/dL 9.0   Total Protein 6.5 - 8.1 g/dL 6.2   Total Bilirubin 0.3 - 1.2 mg/dL 0.5   Alkaline Phos 38 - 126 U/L 57   AST 15 - 41 U/L 16   ALT 0 - 44 U/L 13       Latest Ref Rng & Units 11/08/2022   10:50 AM  CBC  WBC 4.0 - 10.5 K/uL 9.5   Hemoglobin 12.0 - 15.0 g/dL 44.0   Hematocrit 34.7 - 46.0 % 34.9   Platelets 150 - 400 K/uL 270     No images are attached to the encounter.  MR PELVIS W WO CONTRAST  Result Date: 11/04/2022 CLINICAL DATA:  Left adnexal mass identified by prior CT EXAM: MRI PELVIS WITHOUT AND WITH CONTRAST TECHNIQUE:  Multiplanar multisequence MR imaging of the pelvis was performed both before and after administration of intravenous contrast. CONTRAST:  4mL GADAVIST GADOBUTROL 1 MMOL/ML IV SOLN COMPARISON:  CT abdomen pelvis, 10/31/2022, CT abdomen pelvis, 02/22/2018 FINDINGS: Urinary Tract:  No abnormality visualized. Bowel:  Unremarkable visualized pelvic bowel loops. Vascular/Lymphatic: No pathologically enlarged lymph nodes. No significant vascular abnormality seen. Reproductive: Normal uterus. Normal postmenopausal appearance of the right ovary. Large, lobulated left ovarian mass measuring 7.9 x 7.0 x 5.9 cm (series 4, image 18). This is heterogeneously T2 hypointense with irregular regions of internal T2 hyperintensity and poorly enhancing. Other:  Small volume free fluid throughout the pelvis. Musculoskeletal: No suspicious bone lesions identified. IMPRESSION: 1. Large, lobulated left ovarian mass measuring 7.9 x 7.0 x 5.9 cm. This is  heterogeneously T2 hypointense with irregular regions of internal T2 hyperintensity and poorly enhancing. 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 In retrospective review, this lesion was present on remote prior CT dated 02/22/2018 and has significantly enlarged in size over the interval, demonstrating relatively indolent behavior. This is however nonetheless technically characterized as an O-RADS category 4 lesion and remains suspicious for solid malignancy. Recommend surgical referral. 2. Small volume free fluid throughout the pelvis. 3. No evidence of lymphadenopathy or metastatic disease in the pelvis. These results will be called to the ordering clinician or representative by the Radiologist Assistant, and communication documented in the PACS or [employer]. Electronically Signed   By: Jearld Lesch M.D.   On: 11/04/2022 18:52   CT ABDOMEN PELVIS W CONTRAST  Result Date: 10/31/2022 CLINICAL DATA:  Abdominal/flank pain, stone suspected. EXAM: CT ABDOMEN AND PELVIS WITH CONTRAST TECHNIQUE: Multidetector CT imaging of the abdomen and pelvis was performed using the standard protocol following bolus administration of intravenous contrast. RADIATION DOSE REDUCTION: This exam was performed according to the departmental dose-optimization program which includes automated exposure control, adjustment of the mA and/or kV according to patient size and/or use of iterative reconstruction technique. CONTRAST:  OMNIPAQUE IOHEXOL 300 MG/ML  SOLN COMPARISON:  CT scan renal stone protocol from 02/22/2018. FINDINGS: Lower chest: Patchy atelectasis/scarring noted in the inferior lingula. The lung bases are otherwise clear. No pleural effusion. The heart is normal in size. No pericardial effusion. Hepatobiliary: The liver is normal in size. Non-cirrhotic configuration. No suspicious mass. No intrahepatic or extrahepatic bile duct  dilation. No calcified gallstones. Normal gallbladder wall thickness. No pericholecystic inflammatory changes. Pancreas: Unremarkable. No pancreatic ductal dilatation or surrounding inflammatory changes. Spleen: Within normal limits. No focal lesion. Adrenals/Urinary Tract: Adrenal glands are unremarkable. There are several subcentimeter hypoattenuating foci bilateral kidneys, which are too small to adequately characterize. No hydronephrosis or hydroureter. However, note is made of mild right urothelial thickening and mild thickening/hyperattenuating walls of bilateral visualized upper ureters. Findings are nonspecific but can be seen with urinary tract infection/ureteritis. Correlate clinically and with urinalysis. No nephroureterolithiasis on either side. There is mild circumferential thickening of the urinary bladder which is hyperattenuating and there is mild perivesical fat stranding, compatible with cystitis. No focal bladder mass or bladder calculi. Stomach/Bowel: No disproportionate dilation of the small or large bowel loops. No evidence of abnormal bowel wall thickening or inflammatory changes. Portion of appendix is seen in the right lower quadrant and appear unremarkable. However, rest of the appendix is not distinctly separated from other bowel loops. There are no inflammatory changes in the right lower quadrant. Vascular/Lymphatic: No pneumoperitoneum. There is trace amount of fluid  in the right paracolic gutter. No walled-off abscess. No abdominal or pelvic lymphadenopathy, by size criteria. No aneurysmal dilation of the major abdominal arteries. Reproductive: Normal-size anteverted uterus. No focal mass. The endometrium measures up to 5-6 mm. No focal mass. There is a well-circumscribed hypoattenuating heterogeneous mass in the left adnexa measuring approximately 7.2 x 8.5 cm orthogonally on axial plane. The ovaries are not distinctly/separately seen. Other: There is a tiny fat containing umbilical  hernia. The soft tissues and abdominal wall are otherwise unremarkable. Musculoskeletal: No suspicious osseous lesions. IMPRESSION: 1. Findings compatible with cystitis and ascending urinary tract infection/ureteritis. Correlate clinically and with urinalysis. No nephroureterolithiasis or hydronephrosis. 2. Well-circumscribed hypoattenuating heterogeneous mass in the left adnexa measuring up to 8.5 cm. The ovaries are not distinctly/separately seen. Recommend further evaluation with contrast-enhanced pelvic MRI. 3. Trace amount of fluid in the right paracolic gutter, likely reactive. 4. The endometrium measures up to 5-6 mm. Correlate with patient's menstrual status. If the patient is postmenopausal, recommend further evaluation with pelvic ultrasound. 5. Multiple other nonacute observations, as described above. Electronically Signed   By: Jules Schick M.D.   On: 10/31/2022 09:37   DG Abd Portable 1 View  Result Date: 10/20/2022 CLINICAL DATA:  57 year old female with history of constipation. EXAM: PORTABLE ABDOMEN - 1 VIEW COMPARISON:  Abdominal radiograph 02/26/2018. FINDINGS: Gas and stool are seen scattered throughout the colon extending to the level of the distal rectum. No pathologic distension of small bowel is noted. No gross evidence of pneumoperitoneum. Stool burden does not appear excessive. Surgical clip projecting over the left upper quadrant of the abdomen. IMPRESSION: 1. Nonobstructive bowel gas pattern. 2. No pneumoperitoneum. Electronically Signed   By: Trudie Reed M.D.   On: 10/20/2022 07:53     Assessment and plan- Patient is a 57 y.o. female  with history of BRCA2 mutation and second left breast cancer stage Ib MP T2 N0 versus pT1 cN1 M0 ER/PR positive HER2 positive.  She is here for on treatment assessment prior to cycle 2 of adjuvant TCHP chemotherapy  Patient will be receiving multiple premeds given that she had reaction with cycle 1 of TCHP chemotherapy.  It appears that she  had tingling in her lips and choking sensation with both chemotherapy and Herceptin.  Have also started her on prednisone and Singulair to be taken 2 days prior to chemotherapy.  She will come on day 3 of udenyca  I will see her in 3 weeks for cycle 3 of TCHP chemotherapy.  When patient presented to the ER with symptoms of flank pain she underwent a CT abdomen which incidentally showed a 7.2 x 8.5 cm left adnexal mass.  Ovaries were not seen as a distinct entity.  This was followed by MRI pelvis and this lesion was present even back in 2020 but has significantly increased in size now.  I will be checking Ca1 25 and she has an appointment with GYN oncology for further management of this mass as well.  There is concern for ovarian cancer given that she has BRCA2 mutation but given that this has been present for the last 4 years we could be even dealing with a borderline tumor.     Visit Diagnosis 1. Malignant neoplasm of upper-outer quadrant of left breast in female, estrogen receptor positive (HCC)   2. Ovarian mass, left   3. Encounter for antineoplastic chemotherapy   4. Encounter for monoclonal antibody treatment for malignancy      Dr. Owens Shark, MD, MPH  [facility] [phone] 11/10/2022 10:50 AM

## 2022-11-11 ENCOUNTER — Ambulatory Visit: Payer: Commercial Managed Care - PPO

## 2022-11-11 ENCOUNTER — Ambulatory Visit: Payer: Commercial Managed Care - PPO | Admitting: Oncology

## 2022-11-11 ENCOUNTER — Encounter: Payer: Self-pay | Admitting: *Deleted

## 2022-11-11 ENCOUNTER — Inpatient Hospital Stay: Payer: Commercial Managed Care - PPO

## 2022-11-11 ENCOUNTER — Other Ambulatory Visit: Payer: Commercial Managed Care - PPO

## 2022-11-11 ENCOUNTER — Other Ambulatory Visit: Payer: Self-pay | Admitting: *Deleted

## 2022-11-11 ENCOUNTER — Encounter: Payer: Self-pay | Admitting: Oncology

## 2022-11-11 DIAGNOSIS — Z170 Estrogen receptor positive status [ER+]: Secondary | ICD-10-CM

## 2022-11-11 DIAGNOSIS — E638 Other specified nutritional deficiencies: Secondary | ICD-10-CM

## 2022-11-11 DIAGNOSIS — C50412 Malignant neoplasm of upper-outer quadrant of left female breast: Secondary | ICD-10-CM | POA: Diagnosis not present

## 2022-11-11 DIAGNOSIS — N838 Other noninflammatory disorders of ovary, fallopian tube and broad ligament: Secondary | ICD-10-CM

## 2022-11-11 MED ORDER — FAMOTIDINE IN NACL 20-0.9 MG/50ML-% IV SOLN
20.0000 mg | Freq: Once | INTRAVENOUS | Status: AC
  Administered 2022-11-11: 20 mg via INTRAVENOUS
  Filled 2022-11-11: qty 50

## 2022-11-11 MED ORDER — PALONOSETRON HCL INJECTION 0.25 MG/5ML
0.2500 mg | Freq: Once | INTRAVENOUS | Status: AC
  Administered 2022-11-11: 0.25 mg via INTRAVENOUS
  Filled 2022-11-11: qty 5

## 2022-11-11 MED ORDER — DEXAMETHASONE SODIUM PHOSPHATE 10 MG/ML IJ SOLN
10.0000 mg | Freq: Once | INTRAMUSCULAR | Status: AC
  Administered 2022-11-11: 10 mg via INTRAVENOUS
  Filled 2022-11-11: qty 1

## 2022-11-11 MED ORDER — SODIUM CHLORIDE 0.9 % IV SOLN
420.0000 mg | Freq: Once | INTRAVENOUS | Status: AC
  Administered 2022-11-11: 420 mg via INTRAVENOUS
  Filled 2022-11-11: qty 14

## 2022-11-11 MED ORDER — ONDANSETRON HCL 8 MG PO TABS
8.0000 mg | ORAL_TABLET | Freq: Three times a day (TID) | ORAL | 0 refills | Status: DC | PRN
Start: 2022-11-11 — End: 2023-10-22

## 2022-11-11 MED ORDER — SODIUM CHLORIDE 0.9 % IV SOLN
50.0000 mg/m2 | Freq: Once | INTRAVENOUS | Status: AC
  Administered 2022-11-11: 68 mg via INTRAVENOUS
  Filled 2022-11-11: qty 6.8

## 2022-11-11 MED ORDER — TRASTUZUMAB-ANNS CHEMO 150 MG IV SOLR: 6.0000 mg/kg | Freq: Once | INTRAVENOUS | Status: DC

## 2022-11-11 MED ORDER — SODIUM CHLORIDE 0.9 % IV SOLN
INTRAVENOUS | Status: DC
  Filled 2022-11-11: qty 250

## 2022-11-11 MED ORDER — FOSAPREPITANT DIMEGLUMINE INJECTION 150 MG
150.0000 mg | Freq: Once | INTRAVENOUS | Status: AC
  Administered 2022-11-11: 150 mg via INTRAVENOUS
  Filled 2022-11-11: qty 150

## 2022-11-11 MED ORDER — SODIUM CHLORIDE 0.9 % IV SOLN
6.0000 mg/kg | Freq: Once | INTRAVENOUS | Status: AC
  Administered 2022-11-11: 252 mg via INTRAVENOUS
  Filled 2022-11-11: qty 12

## 2022-11-11 MED ORDER — ACETAMINOPHEN 325 MG PO TABS
650.0000 mg | ORAL_TABLET | Freq: Once | ORAL | Status: AC
Start: 2022-11-11 — End: 2022-11-11
  Administered 2022-11-11: 650 mg via ORAL
  Filled 2022-11-11: qty 2

## 2022-11-11 MED ORDER — CETIRIZINE HCL 10 MG/ML IV SOLN
10.0000 mg | Freq: Once | INTRAVENOUS | Status: AC
  Administered 2022-11-11: 10 mg via INTRAVENOUS
  Filled 2022-11-11: qty 1

## 2022-11-11 MED ORDER — SODIUM CHLORIDE 0.9 % IV SOLN
75.0000 mg/m2 | Freq: Once | INTRAVENOUS | Status: DC
Start: 2022-11-11 — End: 2022-11-11

## 2022-11-11 MED ORDER — HEPARIN SOD (PORK) LOCK FLUSH 100 UNIT/ML IV SOLN
500.0000 [IU] | Freq: Once | INTRAVENOUS | Status: AC | PRN
  Administered 2022-11-11: 500 [IU]
  Filled 2022-11-11: qty 5

## 2022-11-11 MED ORDER — SODIUM CHLORIDE 0.9 % IV SOLN
372.0000 mg | Freq: Once | INTRAVENOUS | Status: AC
  Administered 2022-11-11: 370 mg via INTRAVENOUS
  Filled 2022-11-11: qty 37

## 2022-11-11 NOTE — Progress Notes (Signed)
Per Dr Smith Robert, keep Taxotere dose basis at 50 mg/m2 today & infuse as 1st timer.  Kanjinti infuse over 90 min today.    Ebony Hail, Pharm.D., CPP 11/11/2022@8 :57 AM

## 2022-11-11 NOTE — Progress Notes (Signed)
The following: Neulasta has been selected for use in this patient.    Sharen Hones, PharmD, BCPS Clinical Pharmacist

## 2022-11-11 NOTE — Patient Instructions (Signed)
[facility]  Discharge Instructions: Thank you for choosing [facility] to provide your oncology and hematology care.  If you have a lab appointment with the Cancer Center, please go directly to the Cancer Center and check in at the registration area.  Wear comfortable clothing and clothing appropriate for easy access to any Portacath or PICC line.   We strive to give you quality time with your provider. You may need to reschedule your appointment if you arrive late (15 or more minutes).  Arriving late affects you and other patients whose appointments are after yours.  Also, if you miss three or more appointments without notifying the office, you may be dismissed from the clinic at the provider's discretion.      For prescription refill requests, have your pharmacy contact our office and allow 72 hours for refills to be completed.    Today you received the following chemotherapy and/or immunotherapy agents Kanjinti, Perjeta, Taxotere, Carboplatin      To help prevent nausea and vomiting after your treatment, we encourage you to take your nausea medication as directed.  BELOW ARE SYMPTOMS THAT SHOULD BE REPORTED IMMEDIATELY: *FEVER GREATER THAN 100.4 F (38 C) OR HIGHER *CHILLS OR SWEATING *NAUSEA AND VOMITING THAT IS NOT CONTROLLED WITH YOUR NAUSEA MEDICATION *UNUSUAL SHORTNESS OF BREATH *UNUSUAL BRUISING OR BLEEDING *URINARY PROBLEMS (pain or burning when urinating, or frequent urination) *BOWEL PROBLEMS (unusual diarrhea, constipation, pain near the anus) TENDERNESS IN MOUTH AND THROAT WITH OR WITHOUT PRESENCE OF ULCERS (sore throat, sores in mouth, or a toothache) UNUSUAL RASH, SWELLING OR PAIN  UNUSUAL VAGINAL DISCHARGE OR ITCHING   Items with * indicate a potential emergency and should be followed up as soon as possible or go to the Emergency Department if any problems should occur.  Please show the CHEMOTHERAPY ALERT CARD or  IMMUNOTHERAPY ALERT CARD at check-in to the Emergency Department and triage nurse.  Should you have questions after your visit or need to cancel or reschedule your appointment, please contact [facility]  [phone] and follow the prompts.  Office hours are 8:00 a.m. to 4:30 p.m. Monday - Friday. Please note that voicemails left after 4:00 p.m. may not be returned until the following business day.  We are closed weekends and major holidays. You have access to a nurse at all times for urgent questions. Please call the main number to the clinic [phone] and follow the prompts.  For any non-urgent questions, you may also contact your provider using MyChart. We now offer e-Visits for anyone 75 and older to request care online for non-urgent symptoms. For details visit [email or web address].   Also download the MyChart app! Go to the app store, search "MyChart", open the app, select [facility], and log in with your MyChart username and password.  Docetaxel Injection What is this medication? DOCETAXEL (doe se TAX el) treats some types of cancer. It works by slowing down the growth of cancer cells. This medicine may be used for other purposes; ask your health care provider or pharmacist if you have questions. COMMON BRAND NAME(S): Docefrez, Docivyx, Taxotere What should I tell my care team before I take this medication? They need to know if you have any of these conditions: Kidney disease Liver disease Low white blood cell levels Tingling of the fingers or toes or other nerve disorder An unusual or allergic reaction to docetaxel, polysorbate 80, other medications, foods, dyes, or preservatives Pregnant or trying  to get pregnant Breast-feeding How should I use this medication? This medication is injected into a vein. It is given by your care team in a hospital or clinic setting. Talk to your care team about the use of this medication in children. Special  care may be needed. Overdosage: If you think you have taken too much of this medicine contact a poison control center or emergency room at once. NOTE: This medicine is only for you. Do not share this medicine with others. What if I miss a dose? Keep appointments for follow-up doses. It is important not to miss your dose. Call your care team if you are unable to keep an appointment. What may interact with this medication? Do not take this medication with any of the following: Live virus vaccines This medication may also interact with the following: Certain antibiotics, such as clarithromycin, telithromycin Certain antivirals for HIV or hepatitis Certain medications for fungal infections, such as itraconazole, ketoconazole, voriconazole Grapefruit juice Nefazodone Supplements, such as St. John's wort This list may not describe all possible interactions. Give your health care provider a list of all the medicines, herbs, non-prescription drugs, or dietary supplements you use. Also tell them if you smoke, drink alcohol, or use illegal drugs. Some items may interact with your medicine. What should I watch for while using this medication? This medication may make you feel generally unwell. This is not uncommon as chemotherapy can affect healthy cells as well as cancer cells. Report any side effects. Continue your course of treatment even though you feel ill unless your care team tells you to stop. You may need blood work done while you are taking this medication. This medication can cause serious side effects and infusion reactions. To reduce the risk, your care team may give you other medications to take before receiving this one. Be sure to follow the directions from your care team. This medication may increase your risk of getting an infection. Call your care team for advice if you get a fever, chills, sore throat, or other symptoms of a cold or flu. Do not treat yourself. Try to avoid being around  people who are sick. Avoid taking medications that contain aspirin, acetaminophen, ibuprofen, naproxen, or ketoprofen unless instructed by your care team. These medications may hide a fever. Be careful brushing or flossing your teeth or using a toothpick because you may get an infection or bleed more easily. If you have any dental work done, tell your dentist you are receiving this medication. Some products may contain alcohol. Ask your care team if this medication contains alcohol. Be sure to tell all care teams you are taking this medicine. Certain medications, like metronidazole and disulfiram, can cause an unpleasant reaction when taken with alcohol. The reaction includes flushing, headache, nausea, vomiting, sweating, and increased thirst. The reaction can last from 30 minutes to several hours. This medication may affect your coordination, reaction time, or judgement. Do not drive or operate machinery until you know how this medication affects you. Sit up or stand slowly to reduce the risk of dizzy or fainting spells. Drinking alcohol with this medication can increase the risk of these side effects. Talk to your care team about your risk of cancer. You may be more at risk for certain types of cancer if you take this medication. Talk to your care team if you wish to become pregnant or think you might be pregnant. This medication can cause serious birth defects if taken during pregnancy or if you get  pregnant within 2 months after stopping therapy. A negative pregnancy test is required before starting this medication. A reliable form of contraception is recommended while taking this medication and for 2 months after stopping it. Talk to your care team about reliable forms of contraception. Do not breast-feed while taking this medication and for 1 week after stopping therapy. Use a condom during sex and for 4 months after stopping therapy. Tell your care team right away if you think your partner might be  pregnant. This medication can cause serious birth defects. This medication may cause infertility. Talk to your care team if you are concerned about your fertility. What side effects may I notice from receiving this medication? Side effects that you should report to your care team as soon as possible: Allergic reactions--skin rash, itching, hives, swelling of the face, lips, tongue, or throat Change in vision such as blurry vision, seeing halos around lights, vision loss Infection--fever, chills, cough, or sore throat Infusion reactions--chest pain, shortness of breath or trouble breathing, feeling faint or lightheaded Low red blood cell level--unusual weakness or fatigue, dizziness, headache, trouble breathing Pain, tingling, or numbness in the hands or feet Painful swelling, warmth, or redness of the skin, blisters or sores at the infusion site Redness, blistering, peeling, or loosening of the skin, including inside the mouth Sudden or severe stomach pain, bloody diarrhea, fever, nausea, vomiting Swelling of the ankles, hands, or feet Tumor lysis syndrome (TLS)--nausea, vomiting, diarrhea, decrease in the amount of urine, dark urine, unusual weakness or fatigue, confusion, muscle pain or cramps, fast or irregular heartbeat, joint pain Unusual bruising or bleeding Side effects that usually do not require medical attention (report to your care team if they continue or are bothersome): Change in nail shape, thickness, or color Change in taste Hair loss Increased tears This list may not describe all possible side effects. Call your doctor for medical advice about side effects. You may report side effects to FDA at 1-800-FDA-1088. Where should I keep my medication? This medication is given in a hospital or clinic. It will not be stored at home. NOTE: This sheet is a summary. It may not cover all possible information. If you have questions about this medicine, talk to your doctor, pharmacist, or  health care provider.  2024 Elsevier/Gold Standard (2021-03-08 00:00:00)  Trastuzumab Injection What is this medication? TRASTUZUMAB (tras TOO zoo mab) treats breast cancer and stomach cancer. It works by blocking a protein that causes cancer cells to grow and multiply. This helps to slow or stop the spread of cancer cells. This medicine may be used for other purposes; ask your health care provider or pharmacist if you have questions. COMMON BRAND NAME(S): Herceptin, Marlowe Alt, Ontruzant, Trazimera What should I tell my care team before I take this medication? They need to know if you have any of these conditions: Heart failure Lung disease An unusual or allergic reaction to trastuzumab, other medications, foods, dyes, or preservatives Pregnant or trying to get pregnant Breast-feeding How should I use this medication? This medication is injected into a vein. It is given by your care team in a hospital or clinic setting. Talk to your care team about the use of this medication in children. It is not approved for use in children. Overdosage: If you think you have taken too much of this medicine contact a poison control center or emergency room at once. NOTE: This medicine is only for you. Do not share this medicine with others. What if I  miss a dose? Keep appointments for follow-up doses. It is important not to miss your dose. Call your care team if you are unable to keep an appointment. What may interact with this medication? Certain types of chemotherapy, such as daunorubicin, doxorubicin, epirubicin, idarubicin This list may not describe all possible interactions. Give your health care provider a list of all the medicines, herbs, non-prescription drugs, or dietary supplements you use. Also tell them if you smoke, drink alcohol, or use illegal drugs. Some items may interact with your medicine. What should I watch for while using this medication? Your condition will be  monitored carefully while you are receiving this medication. This medication may make you feel generally unwell. This is not uncommon, as chemotherapy affects healthy cells as well as cancer cells. Report any side effects. Continue your course of treatment even though you feel ill unless your care team tells you to stop. This medication may increase your risk of getting an infection. Call your care team for advice if you get a fever, chills, sore throat, or other symptoms of a cold or flu. Do not treat yourself. Try to avoid being around people who are sick. Avoid taking medications that contain aspirin, acetaminophen, ibuprofen, naproxen, or ketoprofen unless instructed by your care team. These medications can hide a fever. Talk to your care team if you may be pregnant. Serious birth defects can occur if you take this medication during pregnancy and for 7 months after the last dose. You will need a negative pregnancy test before starting this medication. Contraception is recommended while taking this medication and for 7 months after the last dose. Your care team can help you find the option that works for you. Do not breastfeed while taking this medication and for 7 months after stopping treatment. What side effects may I notice from receiving this medication? Side effects that you should report to your care team as soon as possible: Allergic reactions or angioedema--skin rash, itching or hives, swelling of the face, eyes, lips, tongue, arms, or legs, trouble swallowing or breathing Dry cough, shortness of breath or trouble breathing Heart failure--shortness of breath, swelling of the ankles, feet, or hands, sudden weight gain, unusual weakness or fatigue Infection--fever, chills, cough, or sore throat Infusion reactions--chest pain, shortness of breath or trouble breathing, feeling faint or lightheaded Side effects that usually do not require medical attention (report to your care team if they  continue or are bothersome): Diarrhea Dizziness Headache Nausea Trouble sleeping Vomiting This list may not describe all possible side effects. Call your doctor for medical advice about side effects. You may report side effects to FDA at 1-800-FDA-1088. Where should I keep my medication? This medication is given in a hospital or clinic. It will not be stored at home. NOTE: This sheet is a summary. It may not cover all possible information. If you have questions about this medicine, talk to your doctor, pharmacist, or health care provider.  2024 Elsevier/Gold Standard (2021-05-15 00:00:00)  Carboplatin Injection What is this medication? CARBOPLATIN (KAR boe pla tin) treats some types of cancer. It works by slowing down the growth of cancer cells. This medicine may be used for other purposes; ask your health care provider or pharmacist if you have questions. COMMON BRAND NAME(S): Paraplatin What should I tell my care team before I take this medication? They need to know if you have any of these conditions: Blood disorders Hearing problems Kidney disease Recent or ongoing radiation therapy An unusual or allergic reaction to  carboplatin, cisplatin, other medications, foods, dyes, or preservatives Pregnant or trying to get pregnant Breast-feeding How should I use this medication? This medication is injected into a vein. It is given by your care team in a hospital or clinic setting. Talk to your care team about the use of this medication in children. Special care may be needed. Overdosage: If you think you have taken too much of this medicine contact a poison control center or emergency room at once. NOTE: This medicine is only for you. Do not share this medicine with others. What if I miss a dose? Keep appointments for follow-up doses. It is important not to miss your dose. Call your care team if you are unable to keep an appointment. What may interact with this medication? Medications  for seizures Some antibiotics, such as amikacin, gentamicin, neomycin, streptomycin, tobramycin Vaccines This list may not describe all possible interactions. Give your health care provider a list of all the medicines, herbs, non-prescription drugs, or dietary supplements you use. Also tell them if you smoke, drink alcohol, or use illegal drugs. Some items may interact with your medicine. What should I watch for while using this medication? Your condition will be monitored carefully while you are receiving this medication. You may need blood work while taking this medication. This medication may make you feel generally unwell. This is not uncommon, as chemotherapy can affect healthy cells as well as cancer cells. Report any side effects. Continue your course of treatment even though you feel ill unless your care team tells you to stop. In some cases, you may be given additional medications to help with side effects. Follow all directions for their use. This medication may increase your risk of getting an infection. Call your care team for advice if you get a fever, chills, sore throat, or other symptoms of a cold or flu. Do not treat yourself. Try to avoid being around people who are sick. Avoid taking medications that contain aspirin, acetaminophen, ibuprofen, naproxen, or ketoprofen unless instructed by your care team. These medications may hide a fever. Be careful brushing or flossing your teeth or using a toothpick because you may get an infection or bleed more easily. If you have any dental work done, tell your dentist you are receiving this medication. Talk to your care team if you wish to become pregnant or think you might be pregnant. This medication can cause serious birth defects. Talk to your care team about effective forms of contraception. Do not breast-feed while taking this medication. What side effects may I notice from receiving this medication? Side effects that you should report to  your care team as soon as possible: Allergic reactions--skin rash, itching, hives, swelling of the face, lips, tongue, or throat Infection--fever, chills, cough, sore throat, wounds that don't heal, pain or trouble when passing urine, general feeling of discomfort or being unwell Low red blood cell level--unusual weakness or fatigue, dizziness, headache, trouble breathing Pain, tingling, or numbness in the hands or feet, muscle weakness, change in vision, confusion or trouble speaking, loss of balance or coordination, trouble walking, seizures Unusual bruising or bleeding Side effects that usually do not require medical attention (report to your care team if they continue or are bothersome): Hair loss Nausea Unusual weakness or fatigue Vomiting This list may not describe all possible side effects. Call your doctor for medical advice about side effects. You may report side effects to FDA at 1-800-FDA-1088. Where should I keep my medication? This medication is given in  a hospital or clinic. It will not be stored at home. NOTE: This sheet is a summary. It may not cover all possible information. If you have questions about this medicine, talk to your doctor, pharmacist, or health care provider.  2024 Elsevier/Gold Standard (2021-04-24 00:00:00)  Pertuzumab Injection What is this medication? PERTUZUMAB (per TOOZ ue mab) treats breast cancer. It works by blocking a protein that causes cancer cells to grow and multiply. This helps to slow or stop the spread of cancer cells. It is a monoclonal antibody. This medicine may be used for other purposes; ask your health care provider or pharmacist if you have questions. COMMON BRAND NAME(S): PERJETA What should I tell my care team before I take this medication? They need to know if you have any of these conditions: Heart failure An unusual or allergic reaction to pertuzumab, other medications, foods, dyes, or preservatives Pregnant or trying to get  pregnant Breast-feeding How should I use this medication? This medication is injected into a vein. It is given by your care team in a hospital or clinic setting. Talk to your care team about the use of this medication in children. Special care may be needed. Overdosage: If you think you have taken too much of this medicine contact a poison control center or emergency room at once. NOTE: This medicine is only for you. Do not share this medicine with others. What if I miss a dose? Keep appointments for follow-up doses. It is important not to miss your dose. Call your care team if you are unable to keep an appointment. What may interact with this medication? Interactions are not expected. This list may not describe all possible interactions. Give your health care provider a list of all the medicines, herbs, non-prescription drugs, or dietary supplements you use. Also tell them if you smoke, drink alcohol, or use illegal drugs. Some items may interact with your medicine. What should I watch for while using this medication? Your condition will be monitored carefully while you are receiving this medication. This medication may make you feel generally unwell. This is not uncommon as chemotherapy can affect healthy cells as well as cancer cells. Report any side effects. Continue your course of treatment even though you feel ill unless your care team tells you to stop. Talk to your care team if you may be pregnant. Serious birth defects can occur if you take this medication during pregnancy and for 7 months after the last dose. You will need a negative pregnancy test before starting this medication. Contraception is recommended while taking this medication and for 7 months after the last dose. Your care team can help you find the option that works for you. Do not breastfeed while taking this medication and for 7 months after the last dose. What side effects may I notice from receiving this medication? Side  effects that you should report to your care team as soon as possible: Allergic reactions or angioedema--skin rash, itching or hives, swelling of the face, eyes, lips, tongue, arms, or legs, trouble swallowing or breathing Heart failure--shortness of breath, swelling of the ankles, feet, or hands, sudden weight gain, unusual weakness or fatigue Infusion reactions--chest pain, shortness of breath or trouble breathing, feeling faint or lightheaded Side effects that usually do not require medical attention (report to your care team if they continue or are bothersome): Diarrhea Dry skin Fatigue Hair loss Nausea Vomiting This list may not describe all possible side effects. Call your doctor for medical advice about side  effects. You may report side effects to FDA at 1-800-FDA-1088. Where should I keep my medication? This medication is given in a hospital or clinic. It will not be stored at home. NOTE: This sheet is a summary. It may not cover all possible information. If you have questions about this medicine, talk to your doctor, pharmacist, or health care provider.  2024 Elsevier/Gold Standard (2021-05-15 00:00:00)

## 2022-11-12 ENCOUNTER — Ambulatory Visit: Payer: Commercial Managed Care - PPO

## 2022-11-12 ENCOUNTER — Other Ambulatory Visit: Payer: Self-pay | Admitting: Oncology

## 2022-11-12 LAB — CA 125: Cancer Antigen (CA) 125: 13.7 U/mL (ref 0.0–38.1)

## 2022-11-13 ENCOUNTER — Inpatient Hospital Stay: Payer: Commercial Managed Care - PPO | Admitting: Obstetrics and Gynecology

## 2022-11-13 ENCOUNTER — Ambulatory Visit: Payer: Commercial Managed Care - PPO

## 2022-11-13 ENCOUNTER — Inpatient Hospital Stay: Payer: Commercial Managed Care - PPO

## 2022-11-13 VITALS — BP 133/75 | Temp 98.6°F | Resp 20 | Wt 94.0 lb

## 2022-11-13 VITALS — BP 135/75 | Temp 98.6°F | Wt 94.4 lb

## 2022-11-13 DIAGNOSIS — C50412 Malignant neoplasm of upper-outer quadrant of left female breast: Secondary | ICD-10-CM | POA: Diagnosis not present

## 2022-11-13 DIAGNOSIS — D3912 Neoplasm of uncertain behavior of left ovary: Secondary | ICD-10-CM

## 2022-11-13 DIAGNOSIS — N838 Other noninflammatory disorders of ovary, fallopian tube and broad ligament: Secondary | ICD-10-CM

## 2022-11-13 MED ORDER — PEGFILGRASTIM INJECTION 6 MG/0.6ML ~~LOC~~
6.0000 mg | PREFILLED_SYRINGE | Freq: Once | SUBCUTANEOUS | Status: AC
  Administered 2022-11-13: 6 mg via SUBCUTANEOUS
  Filled 2022-11-13: qty 0.6

## 2022-11-13 NOTE — Progress Notes (Signed)
Location of Breast Cancer: Malignant neoplasm of upper-outer quadrant of left breast in female, estrogen receptor positive   Histology per Pathology Report:  08-15-22 FINAL MICROSCOPIC DIAGNOSIS: A. BREAST, RIGHT, MASTECTOMY: -  Unremarkable skin, nipple and benign breast tissue with fibrocystic change, adenosis, usual ductal hyperplasia, apocrine metaplasia, fibroadenomatoid change, fibroadenoma and a complex sclerosing lesion/radial scar (4 mm in greatest linear dimension). -  Biopsy site changes present. B. BREAST, LEFT, MASTECTOMY WITH SENTINEL LYMPH NODE: Invasive ductal carcinoma x 2, main mass 24 x 23 x 16 mm, grade III/III; incidental mass 10 mm in greatest dimension, grade II/III Ductal carcinoma in situ: Grade 3 DCIS is present associated at the main mass; grade 2 DCIS is present in the incidental mass; there is also incidental grade 2 DCIS identified in the random upper outer and inner quadrants Margins, invasive: Main mass-1 mm from deep margin; incidental mass positive at the posterolateral anterior margin Margins, DCIS: 1 mm     Closest, DCIS: Deep and posterolateral anterior margins Lymphovascular invasion: Not identified Perineural invasion: Associated with incidental mass Prognostic markers (main mass; APS [id number]):  ER positive, PR positive, Her2 positive, Ki-67 35% Other: It is noted that a sentinel lymph node was submitted.  The sentinel lymph node was not identified grossly; however, the incidental tumor present at the posterolateral anterior margin could represent the surgically suspected lymph node; surgical correlation necessary. See oncology table  Receptor Status: ER(99%), PR (85%), Her2-neu (pos by FISH), Ki-67(35%)  Did patient present with symptoms (if so, please note symptoms) or was this found on screening mammography?: Per Melissa Rodriguez 11/08/22 note: patient "self palpated a left breast lump led to a diagnostic mammogram in July 2024"  Past/Anticipated  interventions by surgeon, if any: Dr. Harriette Rodriguez 08/15/2022 Left breast simple mastectomy with left axillary sentinel lymph node mapping Right risk reducing mastectomy simple    Past/Anticipated interventions by medical oncology, if any:  Melissa Rodriguez 11/08/2022 She is here for on treatment assessment prior to cycle 2 of adjuvant TCHP chemotherapy Patient will be receiving multiple premeds given that she had reaction with cycle 1 of TCHP chemotherapy.   It appears that she had tingling in her lips and choking sensation with both chemotherapy and Herceptin.   Have also started her on prednisone and Singulair to be taken 2 days prior to chemotherapy.   She will come on day 3 of udenyca  I will see her in 3 weeks for cycle 3 of TCHP chemotherapy. When patient presented to the ER with symptoms of flank pain she underwent a CT abdomen which incidentally showed a 7.2 x 8.5 cm left adnexal mass.   Ovaries were not seen as a distinct entity.   This was followed by MRI pelvis and this lesion was present even back in 2020 but has significantly increased in size now.  I will be checking Ca1 25 and she has an appointment with GYN oncology for further management of this mass as well.   There is concern for ovarian cancer given that she has BRCA2 mutation but given that this has been present for the last 4 years we could be even dealing with a borderline tumor. --Visit Diagnosis Malignant neoplasm of upper-outer quadrant of left breast in female, estrogen receptor positive (HCC)  Ovarian mass, left  Encounter for antineoplastic chemotherapy  Encounter for monoclonal antibody treatment for malignancy   Lymphedema issues, if any:  Denies. Working with PT weekly to help with ROM limitations    Pain issues,  if any:  Continues to experience numbess to chest since mastectomy, as well as around port-a-cath  SAFETY ISSUES: Prior radiation? Yes--completed 04/2006 to left breast after lumpectomy Melissa Rodriguez,  [place]) Pacemaker/ICD? No Possible current pregnancy? No--postmenopausal Is the patient on methotrexate? No  Current Complaints / other details:

## 2022-11-13 NOTE — Progress Notes (Signed)
Patient says that she is feeling weak, and yesterday she had sun burn on her face.

## 2022-11-13 NOTE — Progress Notes (Signed)
Gynecologic Oncology Consult Visit   Referring Provider: Dr. Smith Robert  Chief Complaint: Adnexal mass in BRCA2 mutation carrier  Subjective:  Melissa Rodriguez is a 57 y.o. female who is seen in consultation from Dr. Smith Robert for pelvic mass  She has history of breast cancer, w/p lumpectomy then recurrence s/p mastectomy, now with new breast cancer and found to be brca2 mutation carrier, currently undergoing adjuvant chemotherapy after surgical excision with Dr. Smith Robert.  She presented to ER on 10/31/22 for pelvic pain and hematuria due to UTI. CT Abdomen Pelvis was performed which revealed heterogeneous mass in left adnexa measuring 8.5 cm. Ovaries not visualized. Endometrium measuring 5-6 mm. Positive for cystitis and ascending UTI. No hydronephrosis.   11/04/22- MRI Pelvis W WO Contrast IMPRESSION: 1. Large, lobulated left ovarian mass measuring 7.9 x 7.0 x 5.9 cm. This is heterogeneously T2 hypointense with irregular regions of internal T2 hyperintensity and poorly enhancing. 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 In retrospective review, this lesion was present on remote  prior CT dated 02/22/2018 and has significantly enlarged in size over the interval, demonstrating relatively indolent behavior. This is however nonetheless technically characterized as an O-RADS category 4 lesion and remains suspicious for solid malignancy. Recommend surgical referral. 2. Small volume free fluid throughout the pelvis. 3. No evidence of lymphadenopathy or metastatic disease in the pelvis.  Menopause in early 20s, no HRT CA 125 - 11/11/22 13.7     Problem List: Patient Active Problem List   Diagnosis Date Noted   Drug-induced constipation 10/24/2022   Chemotherapy adverse reaction 10/24/2022   Genetic testing 08/07/2022   BRCA2 gene mutation positive 08/07/2022   Breast cancer of upper-outer quadrant of left female breast (HCC)  07/31/2022   Thrombocytopenia (HCC) 05/22/2013   Adenocarcinoma of breast (HCC) 05/20/2011   Iron deficiency anemia 05/20/2011    Past Medical History: Past Medical History:  Diagnosis Date   Adenocarcinoma of breast (HCC) 05/20/2011   Stage I (T1b N0), grade 1, well-differentiated adenocarcinoma of the medial lower quadrant of the left breast. It was a tubular carcinoma, status post lumpectomy on 03/13/2006, ER positive 79%, PR positive 92%, HER2/neu negative, Ki-67 marker low at 18%. Her cancer was 7 mm in size, and she had 4 sentinel nodes all negative along with her lumpectomy. She was treated with radiation therapy postoper   Complication of anesthesia    pt states her bladder did not wake up after one surgery. Went to ER, had a urinary catheter for 1 week   History of kidney stones    Iron deficiency    Iron deficiency anemia 05/20/2011   Secondary to GU bleeding.  Good absorber of PO iron   Personal history of radiation therapy     Past Surgical History: Past Surgical History:  Procedure Laterality Date   BREAST BIOPSY Left 07/16/2022   [place] LT BREAST BX W LOC DEV 1ST LESION IMG BX SPEC US GUIDE 07/16/2022 Edwin Cap, MD [facility]   BREAST BIOPSY Right 07/26/2022   MM RT BREAST BX W LOC DEV 1ST LESION IMAGE BX SPEC STEREO GUIDE 07/26/2022 [facility]   BREAST LUMPECTOMY Left    EXTRACORPOREAL SHOCK WAVE LITHOTRIPSY Left 02/26/2018   Procedure: EXTRACORPOREAL SHOCK WAVE LITHOTRIPSY (ESWL);  Surgeon: Malen Gauze, MD;  Location: [facility];  Service: Urology;  Laterality: Left;   LUMPECTOM LEFT BREAST  02/2006   LYMPH NODE BIOPSY LEFT  04/2006   MASTECTOMY W/ SENTINEL  NODE BIOPSY Left 08/15/2022   Procedure: LEFT SIMPLE MASTECTOMY, LEFT SENTINEL LYMPH NODE MAPPING;  Surgeon: Harriette Bouillon, MD;  Location: [facility];  Service: General;  Laterality: Left;   PORTACATH PLACEMENT N/A 10/09/2022   Procedure: INSERTION PORT-A-CATH;  Surgeon: Harriette Bouillon,  MD;  Location: [facility];  Service: General;  Laterality: N/A;   SIMPLE MASTECTOMY WITH AXILLARY SENTINEL NODE BIOPSY Right 08/15/2022   Procedure: RIGHT RISK REDUCING MASTECTOMY;  Surgeon: Harriette Bouillon, MD;  Location: [facility];  Service: General;  Laterality: Right;      OB History:  OB History  Gravida Para Term Preterm AB Living  3 3 3     3   SAB IAB Ectopic Multiple Live Births               # Outcome Date GA Lbr Len/2nd Weight Sex Type Anes PTL Lv  3 Term           2 Term           1 Term             Family History: Family History  Problem Relation Age of Onset   Hypertension Mother    Hypertension Father    Heart disease Father 50       MI in his 58s   Cancer Maternal Aunt        unk type, possibly hip. dx >50   Breast cancer Paternal Aunt        dx 21s   Cancer Paternal Aunt        unknown, d. 84s   Prostate cancer Cousin        dx 2s-50s   Stomach cancer Cousin        dx 75s-50s    Social History: Social History   Socioeconomic History   Marital status: Married    Spouse name: Not on file   Number of children: Not on file   Years of education: Not on file   Highest education level: Some college, no degree  Occupational History   Not on file  Tobacco Use   Smoking status: Never   Smokeless tobacco: Never  Vaping Use   Vaping status: Never Used  Substance and Sexual Activity   Alcohol use: No   Drug use: No   Sexual activity: Yes    Birth control/protection: Post-menopausal  Other Topics Concern   Not on file  Social History Narrative   Not on file   Social Determinants of Health   Financial Resource Strain: Patient Declined (10/16/2022)   Overall Financial Resource Strain (CARDIA)    Difficulty of Paying Living Expenses: Patient declined  Recent Concern: Physicist, medical Strain - High Risk (10/03/2022)   Overall Financial Resource Strain (CARDIA)    Difficulty of Paying Living Expenses: Hard  Food  Insecurity: No Food Insecurity (10/16/2022)   Hunger Vital Sign    Worried About Running Out of Food in the Last Year: Never true    Ran Out of Food in the Last Year: Never true  Transportation Needs: No Transportation Needs (10/16/2022)   PRAPARE - Administrator, Civil Service (Medical): No    Lack of Transportation (Non-Medical): No  Physical Activity: Insufficiently Active (10/16/2022)   Exercise Vital Sign    Days of Exercise per Week: 1 day    Minutes of Exercise per Session: 20 min  Stress: No Stress Concern Present (10/16/2022)   [employer] of Occupational Health -  Occupational Stress Questionnaire    Feeling of Stress : Not at all  Social Connections: Unknown (10/16/2022)   Social Connection and Isolation Panel [NHANES]    Frequency of Communication with Friends and Family: Patient declined    Frequency of Social Gatherings with Friends and Family: Patient declined    Attends Religious Services: More than 4 times per year    Active Member of [employer] or Organizations: Yes    Attends Engineer, structural: More than 4 times per year    Marital Status: Married  Catering manager Violence: Not on file    Allergies: Allergies  Allergen Reactions   Benadryl [Diphenhydramine Hcl]     Jittery and Hallucinations   Diphenhydramine Hcl Anxiety    Other Reaction(s): Hallucination  Jittery and Hallucinations    Made her feel like she was crawling the walls.   Hydrocodone Shortness Of Breath    Felt like she could not breathe-February 2020   Penicillins Hives and Rash    Did it involve swelling of the face/tongue/throat, SOB, or low BP? No  Did it involve sudden or severe rash/hives, skin peeling, or any reaction on the inside of your mouth or nose? No  Did you need to seek medical attention at a hospital or doctor's office? No  When did it last happen?        If all above answers are "NO", may proceed with cephalosporin use.  Any medicine related to  PCN    Did it involve swelling of the face/tongue/throat, SOB, or low BP? No Did it involve sudden or severe rash/hives, skin peeling, or any reaction on the inside of your mouth or nose? No Did you need to seek medical attention at a hospital or doctor's office? No When did it last happen?       If all above answers are "NO", may proceed with cephalosporin use.   Ciprofloxacin Hives   Docetaxel Other (See Comments)    Tingling of lips, teeth hurting, hot tongue   Trastuzumab-Anns [Trastuzumab-Pkrb] Itching    Tingling and hot tongue and lips    Current Medications: Current Outpatient Medications  Medication Sig Dispense Refill   aluminum-magnesium hydroxide 200-200 MG/5ML suspension Take 15 mLs by mouth every 6 (six) hours as needed for indigestion. Mix 1:1 with Lidocaine and swish and spit 4 times a day 355 mL 2   Bioflavonoid Products (GRAPE SEED PO) Take 1 capsule by mouth daily.     Calcium Carbonate Antacid (TUMS PO) Take 1 tablet by mouth as needed. Reported on 05/22/2015     CARBOPLATIN IV Inject into the vein every 21 ( twenty-one) days.     Cholecalciferol (VITAMIN D PO) Take 1,000 Units by mouth daily.      diphenoxylate-atropine (LOMOTIL) 2.5-0.025 MG tablet Take 1 tablet by mouth 4 (four) times daily as needed for diarrhea or loose stools. 60 tablet 0   DOCETAXEL IV Inject into the vein every 21 ( twenty-one) days.     lidocaine (XYLOCAINE) 2 % solution Use as directed 15 mLs in the mouth or throat 4 (four) times daily. Mix 1:1 with maalox and swish and spit 4 times a day 100 mL 2   lidocaine-prilocaine (EMLA) cream Apply a quarter-sized amount to port a cath site and cover with plastic wrap 1 hour prior to infusion appointments 30 g 3   montelukast (SINGULAIR) 10 MG tablet Take one 10mg  tab for 2 days before chemo (day 1 and day 2) 2 tablet 0  ondansetron (ZOFRAN) 8 MG tablet Take 1 tablet (8 mg total) by mouth every 8 (eight) hours as needed for nausea or vomiting. 20 tablet 0    PERTUZUMAB IV Inject into the vein every 21 ( twenty-one) days.     predniSONE (DELTASONE) 20 MG tablet Take one 20mg  tab for 2 days before chemo (day 1 and day 2) 20 tablet 0   TRASTUZUMAB IV Inject into the vein every 21 ( twenty-one) days.     No current facility-administered medications for this visit.   Facility-Administered Medications Ordered in Other Visits  Medication Dose Route Frequency Provider Last Rate Last Admin   pegfilgrastim (NEULASTA) injection 6 mg  6 mg Subcutaneous Once Creig Hines, MD        Review of Systems General: negative for, fevers, chills, fatigue, changes in sleep, changes in weight or appetite Skin: negative for changes in color, texture, moles or lesions Eyes: negative for, changes in vision, pain, diplopia HEENT: negative for, change in hearing, pain, discharge, tinnitus, vertigo, voice changes, sore throat, neck masses Pulmonary: negative for, dyspnea, orthopnea, productive cough Cardiac: negative for, palpitations, syncope, pain, discomfort, pressure Gastrointestinal: negative for, dysphagia, nausea, vomiting, jaundice, pain, constipation, diarrhea, hematemesis, hematochezia Genitourinary/Sexual: negative for, dysuria, discharge, hesitancy, nocturia, retention, stones, infections, STD's, incontinence Musculoskeletal: negative for, pain, stiffness, swelling, range of motion limitation Hematology: negative for, easy bruising, bleeding Neurologic/Psych: negative for, headaches, seizures, paralysis, weakness, tremor, change in gait, change in sensation, mood swings, depression, anxiety, change in memory  Objective:  Physical Examination:  LMP 07/05/2012     ECOG Performance Status: 1 - Symptomatic but completely ambulatory  General appearance: alert, cooperative, and appears stated age HEENT: sclera clear Neck: no thyroid enlargement or cervical adenopathy Lymph node survey: non-palpable, axillary, inguinal, supraclavicular Cardiovascular:  without murmurs, rubs or gallops Respiratory: clear to auscultation Abdomen: without hepatosplenomegaly Back: inspection of back is normal Extremities: no lower extremity edema Skin exam - normal coloration and turgor, no rashes, no suspicious skin lesions noted. Neurological exam reveals alert, oriented, normal speech, no focal findings or movement disorder noted.  Pelvic: EGBUS: no lesions Cervix: no lesions, nontender, mobile Vagina: no lesions, no discharge or bleeding Uterus: normal size, nontender, mobile Adnexa: large hard mass on left Rectovaginal: confirmatory  Lab Review Labs on site today:   Chemistry      Component Value Date/Time   NA 133 (L) 11/08/2022 1050   K 3.9 11/08/2022 1050   CL 100 11/08/2022 1050   CO2 25 11/08/2022 1050   BUN 16 11/08/2022 1050   CREATININE 0.63 11/08/2022 1050      Component Value Date/Time   CALCIUM 9.0 11/08/2022 1050   ALKPHOS 57 11/08/2022 1050   AST 16 11/08/2022 1050   ALT 13 11/08/2022 1050   BILITOT 0.5 11/08/2022 1050      Lab Results  Component Value Date   WBC 9.5 11/08/2022   HGB 11.5 (L) 11/08/2022   HCT 34.9 (L) 11/08/2022   MCV 88.4 11/08/2022   PLT 270 11/08/2022       Assessment:  LIALA SEALES is a 57 y.o. BRCA2 mutation carrier diagnosed with 8.5 cm solid left adnexal mass that was present in 2020 CT scan and has slowly enlarged to 8.5 cm. Normal CA125.  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    Breast cancer on active treatment.   Plan:   Problem List Items Addressed This Visit   None  Visit Diagnoses     Ovarian mass, left    -  Primary   Relevant Orders   Inhibin A   Inhibin B      We discussed options for management including the need for surgery to remove the mass.  Although she has a 15% risk of high grade serous ovarian cancer due to BRCA2 mutation, this mass is not very suspicious for high grade cancer.  It has been there for  years and enlarged slowly and is rock hard like a myoma, fobroma or calcified mass and CA125 is normal.  Will check inhibin levels, as this could be a granulosa tumor.  Discussed timing of surgery with Dr Smith Robert.  She feels strongly that the adjuvant breast cancer chemotherapy (carboplatin/taxotere, herceptin, perjeta) should not be interrupted, as she has TNBC with a high recurrence risk.  She will be done with chemotherapy I about 3 months.     Suggested return to clinic in 3 months for pre op visit and will schedule for TLH/BSO and possible staging if cancer found.   Daughter is 65 and has the BRCA2 mutation.  Sons not tested yet.    The patient's diagnosis, an outline of the further diagnostic and laboratory studies which will be required, the recommendation for surgery, and alternatives were discussed with her and her accompanying family members.  All questions were answered to their satisfaction.  A total of 60 minutes were spent with the patient/family today; 50% was spent in education, counseling and coordination of care for left ovarian mass.    Alinda Dooms, NP  I personally interviewed and examined the patient. Agreed with the above/below plan of care. I have directly contributed to assessment and plan of care of this patient and educated and discussed with patient and family.  Leida Lauth, MD    CC:  Babs Sciara, MD [address] [place],  [place] [address] [phone]

## 2022-11-14 ENCOUNTER — Ambulatory Visit: Payer: Commercial Managed Care - PPO

## 2022-11-14 ENCOUNTER — Other Ambulatory Visit: Payer: Self-pay | Admitting: Oncology

## 2022-11-14 ENCOUNTER — Telehealth: Payer: Self-pay | Admitting: *Deleted

## 2022-11-14 MED ORDER — NITROFURANTOIN MONOHYD MACRO 100 MG PO CAPS: 100.0000 mg | ORAL_CAPSULE | Freq: Two times a day (BID) | ORAL | 0 refills | Status: DC

## 2022-11-14 NOTE — Telephone Encounter (Signed)
Patient called reporting that she again has a possible UTI post wbc injection She is having severe burning with urination and is asking something be done for it. Please advise

## 2022-11-14 NOTE — Telephone Encounter (Signed)
Macrobid sent

## 2022-11-14 NOTE — Telephone Encounter (Addendum)
I called patient and informed per Dr Smith Robert that prescription being aent to pharmacy for her

## 2022-11-26 ENCOUNTER — Encounter: Payer: Self-pay | Admitting: Oncology

## 2022-11-26 NOTE — Progress Notes (Signed)
Radiation Oncology         [phone] ________________________________  Initial Outpatient Consultation  Name: NAVYA NELTON MRN: [id number]  Date: 11/27/2022  DOB: 10-06-1965  UR:KYHCWC, Jonna Coup, MD  Creig Hines, MD   REFERRING PHYSICIAN: Creig Hines, MD  DIAGNOSIS:    ICD-10-CM   1. Malignant neoplasm of upper-outer quadrant of left breast in female, estrogen receptor positive (HCC)  C50.412    Z17.0        Cancer Staging  Adenocarcinoma of breast (HCC) Staging form: Breast, AJCC 7th Edition - Clinical: Stage IA (T1b, N0, cM0) - Signed by Ellouise Newer, PA on 05/20/2011  Breast cancer of upper-outer quadrant of left female breast [facility]) Staging form: Breast, AJCC 8th Edition - Clinical stage from 07/31/2022: Stage IB (cT2, cN1, cM0, G3, ER+, PR+, HER2+) - Unsigned Histopathologic type: Infiltrating duct carcinoma, NOS Stage prefix: Initial diagnosis Nuclear grade: G3 Histologic grading system: 3 grade system Stage IB Left Breast UOQ, Invasive and in situ ductal carcinoma, ER+ / PR+ / Her2+, Grade 2-3  - possibly involving 2 masses vs 1 mass representing SLN involvement: s/p bilateral mastectomies and left axillary SLN biopsies and positive margin on left; currently undergoing adjuvant chemotherapy    CHIEF COMPLAINT: Here to discuss management of recurrent left breast cancer  HISTORY OF PRESENT ILLNESS::Yoona Judie Petit Bagge is a 57 y.o. female who has a prior history of left breast cancer diagnosed in 2008, s/p: lumpectomy in 2008 which showed a 7 mm stage I ER/PR positive HER2 negative left breast cancer (further detailed below), followed by adjuvant radiation therapy completed in April of 2008. She not not receive adjuvant hormonal therapy. Her history pertaining to her recent recurrence of left breast cancer is detailed as follows:   This past July (2024), the patient palpated a left breast lump. She subsequently presented for a bilateral diagnostic mammogram and left  breast ultrasound on 07/16/22 which demonstrated: a highly suspicious mass in the 1 o'clock left breast measuring 1.9 cm, correlating with the palpable site of concern, and a 1.1 cm group of calcifications in the lower slightly outer right breast. No evidence of left axillary lymphadenopathy was demonstrated.   Biopsy of the 1 o'clock left breast on 07/16/22 showed grade 3 invasive ductal carcinoma measuring 9 mm in the greatest linear extent of the sample. Prognostic indicators significant for: estrogen receptor 99% positive and progesterone receptor 85% positive, both with moderate to strong staining intensity; Proliferation marker Ki67 at 35%; Her2 status positive; Grade 3.   Biopsies of the lower outer right breast calcifications collected on 07/26/22 showed benign findings including (but not limited to) UDH and stromal fibrosis.   She was accordingly referred to Dr. Ellin Saba on 07/31/22 for further management. Given the small size of the HER2 positive cancer, Dr. Ellin Saba cleared her to proceed with a left mastectomy and SLN biopsies without neoadjuvant chemotherapy.   In light of a notable family history and breast cancer recurrence, she was referred to genetics and opted to proceed with testing on 07/31/22. Results came back positive showing one pathogenic variant identified in BRCA2, which is associated with autosomal dominant hereditary breast and ovarian cancer syndrome and autosomal recessive Fanconi anemia.  She was then referred to general surgery and opted to proceed with bilateral mastectomies and left axillary SLN mapping on 08/15/22 under the care of Dr. Luisa Hart. Pathology from the procedure revealed:  -- Left breast and axilla: main tumor the size of 2.4 cm and incidentally noted  tumor the size of 1.0 cm; histology of grade 3 invasive ductal carcinoma with high grade DCIS in the larger mass, grade 2 invasive ductal carcinoma with intermediate grade DCIS and PNI in the incidentally  noted smaller mass, as well as incidental grade 2 DCIS identified in the random upper outer and inner quadrants of the left breast; margin status to invasive disease for the main 2.4 cm mass of 1 mm from the deep margin; margin status to invasive disease for the incidentally noted mass came back positive at the posterolateral anterior margin; margin status to in situ disease of 1 mm from the deep and posterolateral anterior margins. Prognostic indicators significant for: estrogen receptor 99% positive and progesterone receptor 85% positive, both with moderate to strong staining intensity; Proliferation marker Ki67 at 35%; Her2 status positive; Grade 3. Although SLN mapping was supposedly performed and a sentinel lymph node was submitted, a sentinel lymph node was not identified grossly. However, the incidental  tumor present at the posterolateral anterior margin could could have represented the surgically suspected lymph node  --Right breast: benign findings / negative for malignancy.   After reviewing her final pathology, Dr. Theo Dills concurred that the the second incidental tumor nodule represented a positive sentinel lymph node.   Based on her nodal involvement, she was started on adjuvant chemotherapy consisting of TCHP on 10/14/22. She did have a hypersensitivity reaction with her first dose of treatment and was given a Udenyca injection the following day. She has a known history of reactions to medications, and received her first cycle at a reduced dose.   She opted to transfer her care to Dr. Smith Robert on 10/25/22 following her first cycle of chemotherapy and received her most recent cycle on 11/11/22. She continues to have significant side effects from chemotherapy which is expected based on her history. She is scheduled to complete chemotherapy in 3 months.    She also presented to the ED around this time on 10/31/22 with symptoms of flank pain. She underwent a CT of the abdomen at that time which  incidentally showed a 7.2 x 8.5 cm left adnexal mass.  An MRI of the pelvis was subsequently performed on 11/04/22 which demonstrated a large lobulated left ovarian mass measuring 7.9 x 7.0 x 5.9 cm concerning for  a primary ovarian neoplasm, which was noted to display a significant increase in size when compared to imaging performed in 2020.   Based on her BRCA2 mutation and given that that the left adnexal lesion had been present for at least 4 years, concern for ovarian cancer was noted and she was subsequently referred to Dr. Johnnette Litter on 11/13/22 for further management.   Per Dr. Annye Rusk evaluation, she has a 15% risk of high grade serous ovarian cancer due to her BRCA2 mutation. Overall, her mass is not very suspicious for a high grade cancer and has been present and slowly enlarging for years. The mass is also rock hard, which is suggestive of a myoma, fobroma or calcified mass. Dr. Johnnette Litter would like to check her inhibin levels to assess if this ovarian mass is a granulosa tumor.   Dr. Smith Robert has also advised not interrupting her adjuvant chemotherapy (carboplatin/taxotere, herceptin, perjeta) at this time, as she has TNBC with a high recurrence risk.   She will return to Dr. Johnnette Litter in approximately 3 months soon after she completes her chemotherapy for a pre-op visit and to schedule her for further work-up.   She is here with her supportive friend.  PREVIOUS RADIATION THERAPY: Yes   Adjuvant radiation therapy to the left breast completed in April of 2008 - Dr. Dayton Scrape  -- Stage I (T1b N0), grade 1, well-differentiated adenocarcinoma of the medial lower quadrant of the left breast. It was a tubular carcinoma, status post lumpectomy with SLN excisions on 03/13/2006/ Pathology showed ER positive 79%, PR positive 92%, HER2/neu negative, Ki-67 marker low at 18%. Her cancer was 7 mm in size, and she had 4 SLNs removed which were all negative.     PAST MEDICAL HISTORY:  has a past medical  history of Adenocarcinoma of breast (HCC) (05/20/2011), Complication of anesthesia, History of kidney stones, Iron deficiency, Iron deficiency anemia (05/20/2011), and Personal history of radiation therapy.    PAST SURGICAL HISTORY: Past Surgical History:  Procedure Laterality Date   BREAST BIOPSY Left 07/16/2022   [place] LT BREAST BX W LOC DEV 1ST LESION IMG BX SPEC US GUIDE 07/16/2022 Edwin Cap, MD [facility]   BREAST BIOPSY Right 07/26/2022   MM RT BREAST BX W LOC DEV 1ST LESION IMAGE BX SPEC STEREO GUIDE 07/26/2022 [facility]   BREAST LUMPECTOMY Left    EXTRACORPOREAL SHOCK WAVE LITHOTRIPSY Left 02/26/2018   Procedure: EXTRACORPOREAL SHOCK WAVE LITHOTRIPSY (ESWL);  Surgeon: Malen Gauze, MD;  Location: [facility];  Service: Urology;  Laterality: Left;   LUMPECTOM LEFT BREAST  02/2006   LYMPH NODE BIOPSY LEFT  04/2006   MASTECTOMY W/ SENTINEL NODE BIOPSY Left 08/15/2022   Procedure: LEFT SIMPLE MASTECTOMY, LEFT SENTINEL LYMPH NODE MAPPING;  Surgeon: Harriette Bouillon, MD;  Location: [facility];  Service: General;  Laterality: Left;   PORTACATH PLACEMENT N/A 10/09/2022   Procedure: INSERTION PORT-A-CATH;  Surgeon: Harriette Bouillon, MD;  Location: [facility];  Service: General;  Laterality: N/A;   SIMPLE MASTECTOMY WITH AXILLARY SENTINEL NODE BIOPSY Right 08/15/2022   Procedure: RIGHT RISK REDUCING MASTECTOMY;  Surgeon: Harriette Bouillon, MD;  Location: [facility];  Service: General;  Laterality: Right;    FAMILY HISTORY: family history includes Breast cancer in her paternal aunt; Cancer in her maternal aunt and paternal aunt; Heart disease (age of onset: 48) in her father; Hypertension in her father and mother; Prostate cancer in her cousin; Stomach cancer in her cousin.  SOCIAL HISTORY:  reports that she has never smoked. She has never used smokeless tobacco. She reports that she does not drink alcohol and does not use drugs.  ALLERGIES:  Benadryl [diphenhydramine hcl], Diphenhydramine hcl, Hydrocodone, Penicillins, Ciprofloxacin, Docetaxel, and Trastuzumab-anns [trastuzumab-pkrb]  MEDICATIONS:  Current Outpatient Medications  Medication Sig Dispense Refill   aluminum-magnesium hydroxide 200-200 MG/5ML suspension Take 15 mLs by mouth every 6 (six) hours as needed for indigestion. Mix 1:1 with Lidocaine and swish and spit 4 times a day 355 mL 2   Bioflavonoid Products (GRAPE SEED PO) Take 1 capsule by mouth daily.     Calcium Carbonate Antacid (TUMS PO) Take 1 tablet by mouth as needed. Reported on 05/22/2015     CARBOPLATIN IV Inject into the vein every 21 ( twenty-one) days.     Cholecalciferol (VITAMIN D PO) Take 1,000 Units by mouth daily.      diphenoxylate-atropine (LOMOTIL) 2.5-0.025 MG tablet Take 1 tablet by mouth 4 (four) times daily as needed for diarrhea or loose stools. 60 tablet 0   DOCETAXEL IV Inject into the vein every 21 ( twenty-one) days.     lidocaine (XYLOCAINE) 2 % solution Use as directed 15 mLs in the mouth or  throat 4 (four) times daily. Mix 1:1 with maalox and swish and spit 4 times a day 100 mL 2   lidocaine-prilocaine (EMLA) cream Apply a quarter-sized amount to port a cath site and cover with plastic wrap 1 hour prior to infusion appointments 30 g 3   loratadine (CLARITIN) 5 MG chewable tablet Chew 5 mg by mouth daily.     montelukast (SINGULAIR) 10 MG tablet Take one 10mg  tab for 2 days before chemo (day 1 and day 2) 2 tablet 0   nitrofurantoin, macrocrystal-monohydrate, (MACROBID) 100 MG capsule Take 1 capsule (100 mg total) by mouth 2 (two) times daily. 10 capsule 0   ondansetron (ZOFRAN) 8 MG tablet Take 1 tablet (8 mg total) by mouth every 8 (eight) hours as needed for nausea or vomiting. 20 tablet 0   PERTUZUMAB IV Inject into the vein every 21 ( twenty-one) days.     predniSONE (DELTASONE) 20 MG tablet Take one 20mg  tab for 2 days before chemo (day 1 and day 2) 20 tablet 0   TRASTUZUMAB IV Inject  into the vein every 21 ( twenty-one) days.     No current facility-administered medications for this encounter.    REVIEW OF SYSTEMS: As above in HPI.   PHYSICAL EXAM:  height is 5\' 3"  (1.6 m) and weight is 91 lb 6 oz (41.4 kg). Her temporal temperature is 97.5 F (36.4 C) (abnormal). Her blood pressure is 112/57 (abnormal) and her pulse is 98. Her respiration is 18 and oxygen saturation is 99%.   General: Alert and oriented, in no acute distress.  She is thin.  She has a pleasant affect. HEENT: Head is normocephalic. Extraocular movements are intact.   Heart: Regular in rate and rhythm with no murmurs, rubs, or gallops. Chest: Clear to auscultation bilaterally, with no rhonchi, wheezes, or rales. Abdomen: Soft, nontender, nondistended, with no rigidity or guarding. Extremities: No cyanosis or edema. Lymphatics: see Neck Exam Skin: No concerning lesions. Musculoskeletal: symmetric strength and muscle tone throughout.  Good range of motion in her shoulders. Neurologic: Cranial nerves II through XII are grossly intact. No obvious focalities. Speech is fluent. Coordination is intact. Psychiatric: Judgment and insight are intact. Affect is appropriate. Breasts: S/p bilateral mastectomies.  Mastectomy scars are intact bilaterally.  She has some nonspecific nodularity in the left axilla which is multifocal and possibly related to scar tissue versus residual disease.  No sign of skin recurrence.. No other palpable masses appreciated in the breasts or axillae bilaterally.  ECOG = 1  0 - Asymptomatic (Fully active, able to carry on all predisease activities without restriction)  1 - Symptomatic but completely ambulatory (Restricted in physically strenuous activity but ambulatory and able to carry out work of a light or sedentary nature. For example, light housework, office work)  2 - Symptomatic, <50% in bed during the day (Ambulatory and capable of all self care but unable to carry out any work  activities. Up and about more than 50% of waking hours)  3 - Symptomatic, >50% in bed, but not bedbound (Capable of only limited self-care, confined to bed or chair 50% or more of waking hours)  4 - Bedbound (Completely disabled. Cannot carry on any self-care. Totally confined to bed or chair)  5 - Death   Santiago Glad MM, Creech RH, Tormey DC, et al. [phone]). "Toxicity and response criteria of the [facility]". Am. Evlyn Clines. Oncol. 5 (6): 649-55   LABORATORY DATA:  Lab Results  Component Value Date  WBC 9.5 11/08/2022   HGB 11.5 (L) 11/08/2022   HCT 34.9 (L) 11/08/2022   MCV 88.4 11/08/2022   PLT 270 11/08/2022   CMP     Component Value Date/Time   NA 133 (L) 11/08/2022 1050   K 3.9 11/08/2022 1050   CL 100 11/08/2022 1050   CO2 25 11/08/2022 1050   GLUCOSE 103 (H) 11/08/2022 1050   BUN 16 11/08/2022 1050   CREATININE 0.63 11/08/2022 1050   CALCIUM 9.0 11/08/2022 1050   PROT 6.2 (L) 11/08/2022 1050   ALBUMIN 4.0 11/08/2022 1050   AST 16 11/08/2022 1050   ALT 13 11/08/2022 1050   ALKPHOS 57 11/08/2022 1050   BILITOT 0.5 11/08/2022 1050   GFRNONAA >60 11/08/2022 1050   GFRAA >60 02/22/2018 1109         RADIOGRAPHY: MR PELVIS W WO CONTRAST  Result Date: 11/04/2022 CLINICAL DATA:  Left adnexal mass identified by prior CT EXAM: MRI PELVIS WITHOUT AND WITH CONTRAST TECHNIQUE: Multiplanar multisequence MR imaging of the pelvis was performed both before and after administration of intravenous contrast. CONTRAST:  4mL GADAVIST GADOBUTROL 1 MMOL/ML IV SOLN COMPARISON:  CT abdomen pelvis, 10/31/2022, CT abdomen pelvis, 02/22/2018 FINDINGS: Urinary Tract:  No abnormality visualized. Bowel:  Unremarkable visualized pelvic bowel loops. Vascular/Lymphatic: No pathologically enlarged lymph nodes. No significant vascular abnormality seen. Reproductive: Normal uterus. Normal postmenopausal appearance of the right ovary. Large, lobulated left ovarian mass measuring 7.9 x 7.0  x 5.9 cm (series 4, image 18). This is heterogeneously T2 hypointense with irregular regions of internal T2 hyperintensity and poorly enhancing. Other:  Small volume free fluid throughout the pelvis. Musculoskeletal: No suspicious bone lesions identified. IMPRESSION: 1. Large, lobulated left ovarian mass measuring 7.9 x 7.0 x 5.9 cm. This is heterogeneously T2 hypointense with irregular regions of internal T2 hyperintensity and poorly enhancing. 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 In retrospective review, this lesion was present on remote prior CT dated 02/22/2018 and has significantly enlarged in size over the interval, demonstrating relatively indolent behavior. This is however nonetheless technically characterized as an O-RADS category 4 lesion and remains suspicious for solid malignancy. Recommend surgical referral. 2. Small volume free fluid throughout the pelvis. 3. No evidence of lymphadenopathy or metastatic disease in the pelvis. These results will be called to the ordering clinician or representative by the Radiologist Assistant, and communication documented in the PACS or [employer]. Electronically Signed   By: Jearld Lesch M.D.   On: 11/04/2022 18:52   CT ABDOMEN PELVIS W CONTRAST  Result Date: 10/31/2022 CLINICAL DATA:  Abdominal/flank pain, stone suspected. EXAM: CT ABDOMEN AND PELVIS WITH CONTRAST TECHNIQUE: Multidetector CT imaging of the abdomen and pelvis was performed using the standard protocol following bolus administration of intravenous contrast. RADIATION DOSE REDUCTION: This exam was performed according to the departmental dose-optimization program which includes automated exposure control, adjustment of the mA and/or kV according to patient size and/or use of iterative reconstruction technique. CONTRAST:  OMNIPAQUE IOHEXOL 300 MG/ML  SOLN COMPARISON:  CT scan renal stone protocol from  02/22/2018. FINDINGS: Lower chest: Patchy atelectasis/scarring noted in the inferior lingula. The lung bases are otherwise clear. No pleural effusion. The heart is normal in size. No pericardial effusion. Hepatobiliary: The liver is normal in size. Non-cirrhotic configuration. No suspicious mass. No intrahepatic or extrahepatic bile duct dilation. No calcified gallstones. Normal gallbladder wall thickness. No pericholecystic inflammatory changes. Pancreas: Unremarkable. No pancreatic ductal dilatation  or surrounding inflammatory changes. Spleen: Within normal limits. No focal lesion. Adrenals/Urinary Tract: Adrenal glands are unremarkable. There are several subcentimeter hypoattenuating foci bilateral kidneys, which are too small to adequately characterize. No hydronephrosis or hydroureter. However, note is made of mild right urothelial thickening and mild thickening/hyperattenuating walls of bilateral visualized upper ureters. Findings are nonspecific but can be seen with urinary tract infection/ureteritis. Correlate clinically and with urinalysis. No nephroureterolithiasis on either side. There is mild circumferential thickening of the urinary bladder which is hyperattenuating and there is mild perivesical fat stranding, compatible with cystitis. No focal bladder mass or bladder calculi. Stomach/Bowel: No disproportionate dilation of the small or large bowel loops. No evidence of abnormal bowel wall thickening or inflammatory changes. Portion of appendix is seen in the right lower quadrant and appear unremarkable. However, rest of the appendix is not distinctly separated from other bowel loops. There are no inflammatory changes in the right lower quadrant. Vascular/Lymphatic: No pneumoperitoneum. There is trace amount of fluid in the right paracolic gutter. No walled-off abscess. No abdominal or pelvic lymphadenopathy, by size criteria. No aneurysmal dilation of the major abdominal arteries. Reproductive:  Normal-size anteverted uterus. No focal mass. The endometrium measures up to 5-6 mm. No focal mass. There is a well-circumscribed hypoattenuating heterogeneous mass in the left adnexa measuring approximately 7.2 x 8.5 cm orthogonally on axial plane. The ovaries are not distinctly/separately seen. Other: There is a tiny fat containing umbilical hernia. The soft tissues and abdominal wall are otherwise unremarkable. Musculoskeletal: No suspicious osseous lesions. IMPRESSION: 1. Findings compatible with cystitis and ascending urinary tract infection/ureteritis. Correlate clinically and with urinalysis. No nephroureterolithiasis or hydronephrosis. 2. Well-circumscribed hypoattenuating heterogeneous mass in the left adnexa measuring up to 8.5 cm. The ovaries are not distinctly/separately seen. Recommend further evaluation with contrast-enhanced pelvic MRI. 3. Trace amount of fluid in the right paracolic gutter, likely reactive. 4. The endometrium measures up to 5-6 mm. Correlate with patient's menstrual status. If the patient is postmenopausal, recommend further evaluation with pelvic ultrasound. 5. Multiple other nonacute observations, as described above. Electronically Signed   By: Jules Schick M.D.   On: 10/31/2022 09:37      IMPRESSION/PLAN: This is a very pleasant 57 year old woman with a history of left breast cancer treated with lumpectomy and adjuvant radiation 2008.  She now has a history of bilateral mastectomies and BRCA2 mutation.  At her most recent surgery she was found to have a positive margin on the left.  It is somewhat unclear whether this positive margin is a bisected lymph node or multifocal tumor.  It was a pleasure meeting the patient today. We discussed the risks, benefits, and side effects of reirradiation to the left chest wall given her recurrent disease and positive margin.  I recommend radiotherapy to the left chest wall and regional nodes with high tangents to reduce her risk of  locoregional recurrence.  We discussed that radiation would take approximately 5-1/2 to 6-1/2 weeks to complete and that it would be reasonable for her to undergo treatment planning at the end of her chemotherapy course but we could hold radiation until she has satisfactorily recuperated from her ovarian surgery.  We talked about the risks and benefits of reirradiation to the chest wall and given the nature of her recurrent disease and positive margin I think that the benefits outweigh the risks as supported by multi institutional data Smith Mince et al, [place], 2008). We spoke about acute effects including skin irritation and fatigue as well as much  less common late effects including internal organ injury or irritation and rib fracture. We spoke about the latest technology that is used to minimize the risk of late effects for patients undergoing radiotherapy to the breast or chest wall. No guarantees of treatment were given.  She would like to think about her options and talk to her husband.  She would also like to talk to her medical oncologist, Dr. Smith Robert.  She is interested in possibly having a consultation with Dr. Rushie Chestnut at [facility] as this would be a more convenient site for her to receive treatment.  I look forward to participating in the patient's care if she chooses to receive treatment here.  I will be available as needed.   On date of service, in total, I spent 60 minutes on this encounter. Patient was seen in person.   __________________________________________   Lonie Peak, MD  This document serves as a record of services personally performed by Lonie Peak, MD. It was created on her behalf by Neena Rhymes, a trained medical scribe. The creation of this record is based on the scribe's personal observations and the provider's statements to them. This document has been checked and approved by the attending provider.

## 2022-11-27 ENCOUNTER — Encounter: Payer: Self-pay | Admitting: Radiation Oncology

## 2022-11-27 ENCOUNTER — Ambulatory Visit
Admission: RE | Admit: 2022-11-27 | Discharge: 2022-11-27 | Disposition: A | Discharge status: Home / Self Care | Payer: Commercial Managed Care - PPO | Source: Ambulatory Visit | Attending: Radiation Oncology | Admitting: Radiation Oncology

## 2022-11-27 VITALS — BP 112/57 | HR 98 | Temp 97.5°F | Resp 18 | Ht 63.0 in | Wt 91.4 lb

## 2022-11-27 DIAGNOSIS — Z170 Estrogen receptor positive status [ER+]: Secondary | ICD-10-CM

## 2022-11-27 DIAGNOSIS — C50412 Malignant neoplasm of upper-outer quadrant of left female breast: Secondary | ICD-10-CM

## 2022-11-29 ENCOUNTER — Other Ambulatory Visit: Payer: Self-pay | Admitting: *Deleted

## 2022-11-29 MED ORDER — MONTELUKAST SODIUM 10 MG PO TABS: ORAL_TABLET | ORAL | 0 refills | Status: DC

## 2022-11-29 MED FILL — Fosaprepitant Dimeglumine For IV Infusion 150 MG (Base Eq): INTRAVENOUS | Qty: 5 | Status: AC

## 2022-11-29 NOTE — Telephone Encounter (Signed)
Patient called reporting that she needs her pre chemotherapy prescriptions called in She has Prednisone but no Singulair

## 2022-12-02 ENCOUNTER — Encounter: Payer: Self-pay | Admitting: Oncology

## 2022-12-02 ENCOUNTER — Inpatient Hospital Stay: Payer: Commercial Managed Care - PPO

## 2022-12-02 ENCOUNTER — Inpatient Hospital Stay: Payer: Commercial Managed Care - PPO | Attending: Hematology | Admitting: Oncology

## 2022-12-02 ENCOUNTER — Other Ambulatory Visit: Payer: Self-pay | Admitting: *Deleted

## 2022-12-02 VITALS — Resp 18

## 2022-12-02 VITALS — BP 146/66 | HR 88 | Temp 97.7°F | Wt 91.3 lb

## 2022-12-02 DIAGNOSIS — Z5112 Encounter for antineoplastic immunotherapy: Secondary | ICD-10-CM | POA: Insufficient documentation

## 2022-12-02 DIAGNOSIS — Z148 Genetic carrier of other disease: Secondary | ICD-10-CM | POA: Insufficient documentation

## 2022-12-02 DIAGNOSIS — C50412 Malignant neoplasm of upper-outer quadrant of left female breast: Secondary | ICD-10-CM | POA: Insufficient documentation

## 2022-12-02 DIAGNOSIS — Z1509 Genetic susceptibility to other malignant neoplasm: Secondary | ICD-10-CM | POA: Insufficient documentation

## 2022-12-02 DIAGNOSIS — Z853 Personal history of malignant neoplasm of breast: Secondary | ICD-10-CM | POA: Insufficient documentation

## 2022-12-02 DIAGNOSIS — Z5189 Encounter for other specified aftercare: Secondary | ICD-10-CM | POA: Diagnosis not present

## 2022-12-02 DIAGNOSIS — Z803 Family history of malignant neoplasm of breast: Secondary | ICD-10-CM | POA: Diagnosis not present

## 2022-12-02 DIAGNOSIS — M810 Age-related osteoporosis without current pathological fracture: Secondary | ICD-10-CM | POA: Insufficient documentation

## 2022-12-02 DIAGNOSIS — D3912 Neoplasm of uncertain behavior of left ovary: Secondary | ICD-10-CM | POA: Insufficient documentation

## 2022-12-02 DIAGNOSIS — Z170 Estrogen receptor positive status [ER+]: Secondary | ICD-10-CM | POA: Diagnosis present

## 2022-12-02 DIAGNOSIS — Z5111 Encounter for antineoplastic chemotherapy: Secondary | ICD-10-CM | POA: Diagnosis not present

## 2022-12-02 DIAGNOSIS — N838 Other noninflammatory disorders of ovary, fallopian tube and broad ligament: Secondary | ICD-10-CM | POA: Diagnosis not present

## 2022-12-02 DIAGNOSIS — Z9013 Acquired absence of bilateral breasts and nipples: Secondary | ICD-10-CM | POA: Insufficient documentation

## 2022-12-02 DIAGNOSIS — Z1501 Genetic susceptibility to malignant neoplasm of breast: Secondary | ICD-10-CM | POA: Diagnosis not present

## 2022-12-02 LAB — CMP (CANCER CENTER ONLY)
ALT: 17 U/L (ref 0–44)
AST: 18 U/L (ref 15–41)
Albumin: 4.5 g/dL (ref 3.5–5.0)
Alkaline Phosphatase: 72 U/L (ref 38–126)
Anion gap: 10 (ref 5–15)
BUN: 14 mg/dL (ref 6–20)
CO2: 27 mmol/L (ref 22–32)
Calcium: 9.3 mg/dL (ref 8.9–10.3)
Chloride: 100 mmol/L (ref 98–111)
Creatinine: 0.65 mg/dL (ref 0.44–1.00)
GFR, Estimated: >60 mL/min (ref >60)
Glucose, Bld: 103 mg/dL — ABNORMAL HIGH (ref 70–99)
Potassium: 3.4 mmol/L — ABNORMAL LOW (ref 3.5–5.1)
Sodium: 137 mmol/L (ref 135–145)
Total Bilirubin: 0.6 mg/dL (ref <1.2)
Total Protein: 6.8 g/dL (ref 6.5–8.1)

## 2022-12-02 LAB — CBC WITH DIFFERENTIAL (CANCER CENTER ONLY)
Abs Immature Granulocytes: 0.04 10*3/uL (ref 0.00–0.07)
Basophils Absolute: 0 10*3/uL (ref 0.0–0.1)
Basophils Relative: 0 %
Eosinophils Absolute: 0 10*3/uL (ref 0.0–0.5)
Eosinophils Relative: 0 %
HCT: 35.7 % — ABNORMAL LOW (ref 36.0–46.0)
Hemoglobin: 11.8 g/dL — ABNORMAL LOW (ref 12.0–15.0)
Immature Granulocytes: 1 %
Lymphocytes Relative: 19 %
Lymphs Abs: 1.3 10*3/uL (ref 0.7–4.0)
MCH: 29.9 pg (ref 26.0–34.0)
MCHC: 33.1 g/dL (ref 30.0–36.0)
MCV: 90.6 fL (ref 80.0–100.0)
Monocytes Absolute: 0.6 10*3/uL (ref 0.1–1.0)
Monocytes Relative: 9 %
Neutro Abs: 4.9 10*3/uL (ref 1.7–7.7)
Neutrophils Relative %: 71 %
Platelet Count: 206 10*3/uL (ref 150–400)
RBC: 3.94 MIL/uL (ref 3.87–5.11)
RDW: 15.6 % — ABNORMAL HIGH (ref 11.5–15.5)
WBC Count: 7 10*3/uL (ref 4.0–10.5)
nRBC: 0 % (ref 0.0–0.2)

## 2022-12-02 MED ORDER — SODIUM CHLORIDE 0.9 % IV SOLN
150.0000 mg | Freq: Once | INTRAVENOUS | Status: AC
  Administered 2022-12-02: 150 mg via INTRAVENOUS
  Filled 2022-12-02: qty 150

## 2022-12-02 MED ORDER — CETIRIZINE HCL 10 MG/ML IV SOLN
10.0000 mg | Freq: Once | INTRAVENOUS | Status: AC
  Administered 2022-12-02: 10 mg via INTRAVENOUS
  Filled 2022-12-02: qty 1

## 2022-12-02 MED ORDER — CEPHALEXIN 500 MG PO CAPS: 500.0000 mg | ORAL_CAPSULE | Freq: Four times a day (QID) | ORAL | 0 refills | Status: DC

## 2022-12-02 MED ORDER — TRASTUZUMAB-ANNS CHEMO 420 MG IV SOLR
6.0000 mg/kg | Freq: Once | INTRAVENOUS | Status: AC
  Administered 2022-12-02: 252 mg via INTRAVENOUS
  Filled 2022-12-02: qty 12

## 2022-12-02 MED ORDER — SODIUM CHLORIDE 0.9 % IV SOLN
420.0000 mg | Freq: Once | INTRAVENOUS | Status: AC
  Administered 2022-12-02: 420 mg via INTRAVENOUS
  Filled 2022-12-02: qty 14

## 2022-12-02 MED ORDER — HEPARIN SOD (PORK) LOCK FLUSH 100 UNIT/ML IV SOLN
500.0000 [IU] | Freq: Once | INTRAVENOUS | Status: AC | PRN
  Administered 2022-12-02: 500 [IU]
  Filled 2022-12-02: qty 5

## 2022-12-02 MED ORDER — PALONOSETRON HCL INJECTION 0.25 MG/5ML
0.2500 mg | Freq: Once | INTRAVENOUS | Status: AC
  Administered 2022-12-02: 0.25 mg via INTRAVENOUS
  Filled 2022-12-02: qty 5

## 2022-12-02 MED ORDER — SODIUM CHLORIDE 0.9 % IV SOLN
372.0000 mg | Freq: Once | INTRAVENOUS | Status: AC
  Administered 2022-12-02: 370 mg via INTRAVENOUS
  Filled 2022-12-02: qty 37

## 2022-12-02 MED ORDER — SODIUM CHLORIDE 0.9 % IV SOLN
60.0000 mg/m2 | Freq: Once | INTRAVENOUS | Status: AC
  Administered 2022-12-02: 80 mg via INTRAVENOUS
  Filled 2022-12-02: qty 8

## 2022-12-02 MED ORDER — ACETAMINOPHEN 325 MG PO TABS
650.0000 mg | ORAL_TABLET | Freq: Once | ORAL | Status: AC
  Administered 2022-12-02: 650 mg via ORAL
  Filled 2022-12-02: qty 2

## 2022-12-02 MED ORDER — DEXAMETHASONE SODIUM PHOSPHATE 10 MG/ML IJ SOLN
10.0000 mg | Freq: Once | INTRAMUSCULAR | Status: AC
  Administered 2022-12-02: 10 mg via INTRAVENOUS
  Filled 2022-12-02: qty 1

## 2022-12-02 MED ORDER — SODIUM CHLORIDE 0.9% FLUSH
10.0000 mL | INTRAVENOUS | Status: DC | PRN
Start: 2022-12-02 — End: 2022-12-02
  Administered 2022-12-02: 10 mL
  Filled 2022-12-02: qty 10

## 2022-12-02 MED ORDER — SODIUM CHLORIDE 0.9 % IV SOLN
INTRAVENOUS | Status: DC
  Filled 2022-12-02: qty 250

## 2022-12-02 MED ORDER — FAMOTIDINE IN NACL 20-0.9 MG/50ML-% IV SOLN
20.0000 mg | Freq: Once | INTRAVENOUS | Status: AC
  Administered 2022-12-02: 20 mg via INTRAVENOUS
  Filled 2022-12-02: qty 50

## 2022-12-02 NOTE — Progress Notes (Signed)
Hematology/Oncology Consult note [facility]  Telephone:([phone] [phone]  Patient Care Team: Babs Sciara, MD as PCP - General (Family Medicine) Doreatha Massed, MD as Medical Oncologist (Medical Oncology) Therese Sarah, RN as Oncology Nurse Navigator (Medical Oncology) Creig Hines, MD as Consulting Physician (Oncology) Benita Gutter, RN as Oncology Nurse Navigator   Name of the patient: Melissa Rodriguez  [id number]  10-08-1965   Date of visit: 12/02/22  Diagnosis- pathologic prognostic stage Ib invasive mammary carcinoma of the left breast T2 N1 M0 ER/PR positive HER2 positive   Chief complaint/ Reason for visit-on treatment assessment prior to cycle 3 of adjuvant TCHP chemotherapy  Heme/Onc history:  Patient is a 57 year old female with a prior history of lumpectomy in 2008 which showed a 7 mm stage I ER/PR positive HER2 negative left breast cancer.  She underwent lumpectomy and adjuvant radiation therapy but did not go through endocrine therapy.   She self palpated a left breast lump led to a diagnostic mammogram in July 2024.  Diagnostic mammogram showed 1.9 cm irregular hypoechoic mass 1 o'clock position 7 cm from the breast grade 3 ER 99% positive, PR 85% positive and HER2 equivocal by IHC and FISH positive.  Ki-67 35% there were calcifications noted in the right breast as well which were biopsy-proven benign.     She underwent genetic testing and was found to have BRCA2 mutation   Patient was seen by Dr. Ellin Saba and he recommended upfront surgery and consideration for adjuvant chemotherapy.  Patient underwent bilateral mastectomy on 08/15/2022.  No evidence of malignancy in the right breast.  There were 2 distinct masses noted in the left breast with dominant mass measured 2.4 x 2.3 x 1.6 cm grade 3 with negative margins.  There was another 1 cm mass where there was uptake of radiotracer dye with positive posterior lateral  anterior margin.  This was believed to be the sentinel lymph node although on pathology it states that there were no lymph nodes identified likely due to prior lumpectomy. mpT2.  She was staged as T2 N1 triple positive disease.   She was recommended adjuvant TCHP chemotherapy and received cycle 1 on 10/14/2022    Interval history-tolerated cycle 2 of TCHP chemotherapy well.  She does get urinary tract infections with every chemotherapy cycle and is requesting refill for Keflex for this time.  She does report fatigue which lasts for about a week after chemotherapy and then she gradually feels better.  MiraLAX has been making her feel bloated and she is requesting an alternative drug  ECOG PS- 1 Pain scale- 0   Review of systems- Review of Systems  Constitutional:  Positive for malaise/fatigue. Negative for chills, fever and weight loss.  HENT:  Negative for congestion, ear discharge and nosebleeds.   Eyes:  Negative for blurred vision.  Respiratory:  Negative for cough, hemoptysis, sputum production, shortness of breath and wheezing.   Cardiovascular:  Negative for chest pain, palpitations, orthopnea and claudication.  Gastrointestinal:  Negative for abdominal pain, blood in stool, constipation, diarrhea, heartburn, melena, nausea and vomiting.  Genitourinary:  Negative for dysuria, flank pain, frequency, hematuria and urgency.  Musculoskeletal:  Negative for back pain, joint pain and myalgias.  Skin:  Negative for rash.  Neurological:  Negative for dizziness, tingling, focal weakness, seizures, weakness and headaches.  Endo/Heme/Allergies:  Does not bruise/bleed easily.  Psychiatric/Behavioral:  Negative for depression and suicidal ideas. The patient does not have insomnia.  Allergies  Allergen Reactions   Benadryl [Diphenhydramine Hcl]     Jittery and Hallucinations   Diphenhydramine Hcl Anxiety    Other Reaction(s): Hallucination  Jittery and Hallucinations    Made her feel  like she was crawling the walls.   Hydrocodone Shortness Of Breath    Felt like she could not breathe-February 2020   Penicillins Hives and Rash    Did it involve swelling of the face/tongue/throat, SOB, or low BP? No  Did it involve sudden or severe rash/hives, skin peeling, or any reaction on the inside of your mouth or nose? No  Did you need to seek medical attention at a hospital or doctor's office? No  When did it last happen?        If all above answers are "NO", may proceed with cephalosporin use.  Any medicine related to PCN    Did it involve swelling of the face/tongue/throat, SOB, or low BP? No Did it involve sudden or severe rash/hives, skin peeling, or any reaction on the inside of your mouth or nose? No Did you need to seek medical attention at a hospital or doctor's office? No When did it last happen?       If all above answers are "NO", may proceed with cephalosporin use.   Ciprofloxacin Hives   Docetaxel Other (See Comments)    Tingling of lips, teeth hurting, hot tongue   Trastuzumab-Anns [Trastuzumab-Pkrb] Itching    Tingling and hot tongue and lips     Past Medical History:  Diagnosis Date   Adenocarcinoma of breast (HCC) 05/20/2011   Stage I (T1b N0), grade 1, well-differentiated adenocarcinoma of the medial lower quadrant of the left breast. It was a tubular carcinoma, status post lumpectomy on 03/13/2006, ER positive 79%, PR positive 92%, HER2/neu negative, Ki-67 marker low at 18%. Her cancer was 7 mm in size, and she had 4 sentinel nodes all negative along with her lumpectomy. She was treated with radiation therapy postoper   Complication of anesthesia    pt states her bladder did not wake up after one surgery. Went to ER, had a urinary catheter for 1 week   History of kidney stones    Iron deficiency    Iron deficiency anemia 05/20/2011   Secondary to GU bleeding.  Good absorber of PO iron   Personal history of radiation therapy      Past Surgical  History:  Procedure Laterality Date   BREAST BIOPSY Left 07/16/2022   [place] LT BREAST BX W LOC DEV 1ST LESION IMG BX SPEC US GUIDE 07/16/2022 Edwin Cap, MD [facility]   BREAST BIOPSY Right 07/26/2022   MM RT BREAST BX W LOC DEV 1ST LESION IMAGE BX SPEC STEREO GUIDE 07/26/2022 [facility]   BREAST LUMPECTOMY Left    EXTRACORPOREAL SHOCK WAVE LITHOTRIPSY Left 02/26/2018   Procedure: EXTRACORPOREAL SHOCK WAVE LITHOTRIPSY (ESWL);  Surgeon: Malen Gauze, MD;  Location: [facility];  Service: Urology;  Laterality: Left;   LUMPECTOM LEFT BREAST  02/2006   LYMPH NODE BIOPSY LEFT  04/2006   MASTECTOMY W/ SENTINEL NODE BIOPSY Left 08/15/2022   Procedure: LEFT SIMPLE MASTECTOMY, LEFT SENTINEL LYMPH NODE MAPPING;  Surgeon: Harriette Bouillon, MD;  Location: [facility];  Service: General;  Laterality: Left;   PORTACATH PLACEMENT N/A 10/09/2022   Procedure: INSERTION PORT-A-CATH;  Surgeon: Harriette Bouillon, MD;  Location: [facility];  Service: General;  Laterality: N/A;   SIMPLE MASTECTOMY WITH AXILLARY SENTINEL NODE BIOPSY Right 08/15/2022  Procedure: RIGHT RISK REDUCING MASTECTOMY;  Surgeon: Harriette Bouillon, MD;  Location: [facility];  Service: General;  Laterality: Right;    Social History   Socioeconomic History   Marital status: Married    Spouse name: Not on file   Number of children: Not on file   Years of education: Not on file   Highest education level: Some college, no degree  Occupational History   Not on file  Tobacco Use   Smoking status: Never   Smokeless tobacco: Never  Vaping Use   Vaping status: Never Used  Substance and Sexual Activity   Alcohol use: No   Drug use: No   Sexual activity: Yes    Birth control/protection: Post-menopausal  Other Topics Concern   Not on file  Social History Narrative   Not on file   Social Determinants of Health   Financial Resource Strain: Patient Declined (10/16/2022)   Overall Financial  Resource Strain (CARDIA)    Difficulty of Paying Living Expenses: Patient declined  Recent Concern: Physicist, medical Strain - High Risk (10/03/2022)   Overall Financial Resource Strain (CARDIA)    Difficulty of Paying Living Expenses: Hard  Food Insecurity: No Food Insecurity (11/27/2022)   Hunger Vital Sign    Worried About Running Out of Food in the Last Year: Never true    Ran Out of Food in the Last Year: Never true  Transportation Needs: No Transportation Needs (11/27/2022)   PRAPARE - Administrator, Civil Service (Medical): No    Lack of Transportation (Non-Medical): No  Physical Activity: Insufficiently Active (10/16/2022)   Exercise Vital Sign    Days of Exercise per Week: 1 day    Minutes of Exercise per Session: 20 min  Stress: No Stress Concern Present (10/16/2022)   [employer] of Occupational Health - Occupational Stress Questionnaire    Feeling of Stress : Not at all  Social Connections: Unknown (10/16/2022)   Social Connection and Isolation Panel [NHANES]    Frequency of Communication with Friends and Family: Patient declined    Frequency of Social Gatherings with Friends and Family: Patient declined    Attends Religious Services: More than 4 times per year    Active Member of [employer] or Organizations: Yes    Attends Engineer, structural: More than 4 times per year    Marital Status: Married  Catering manager Violence: Not At Risk (11/27/2022)   Humiliation, Afraid, Rape, and Kick questionnaire    Fear of Current or Ex-Partner: No    Emotionally Abused: No    Physically Abused: No    Sexually Abused: No    Family History  Problem Relation Age of Onset   Hypertension Mother    Hypertension Father    Heart disease Father 83       MI in his 41s   Cancer Maternal Aunt        unk type, possibly hip. dx >50   Breast cancer Paternal Aunt        dx 72s   Cancer Paternal Aunt        unknown, d. 61s   Prostate cancer Cousin        dx  53s-50s   Stomach cancer Cousin        dx 40s-50s     Current Outpatient Medications:    aluminum-magnesium hydroxide 200-200 MG/5ML suspension, Take 15 mLs by mouth every 6 (six) hours as needed for indigestion. Mix 1:1 with Lidocaine and swish and  spit 4 times a day, Disp: 355 mL, Rfl: 2   Bioflavonoid Products (GRAPE SEED PO), Take 1 capsule by mouth daily., Disp: , Rfl:    Calcium Carbonate Antacid (TUMS PO), Take 1 tablet by mouth as needed. Reported on 05/22/2015, Disp: , Rfl:    CARBOPLATIN IV, Inject into the vein every 21 ( twenty-one) days., Disp: , Rfl:    cephALEXin (KEFLEX) 500 MG capsule, Take 1 capsule (500 mg total) by mouth 4 (four) times daily., Disp: 40 capsule, Rfl: 0   Cholecalciferol (VITAMIN D PO), Take 1,000 Units by mouth daily. , Disp: , Rfl:    diphenoxylate-atropine (LOMOTIL) 2.5-0.025 MG tablet, Take 1 tablet by mouth 4 (four) times daily as needed for diarrhea or loose stools., Disp: 60 tablet, Rfl: 0   DOCETAXEL IV, Inject into the vein every 21 ( twenty-one) days., Disp: , Rfl:    lidocaine (XYLOCAINE) 2 % solution, Use as directed 15 mLs in the mouth or throat 4 (four) times daily. Mix 1:1 with maalox and swish and spit 4 times a day, Disp: 100 mL, Rfl: 2   lidocaine-prilocaine (EMLA) cream, Apply a quarter-sized amount to port a cath site and cover with plastic wrap 1 hour prior to infusion appointments, Disp: 30 g, Rfl: 3   loratadine (CLARITIN) 5 MG chewable tablet, Chew 5 mg by mouth daily., Disp: , Rfl:    montelukast (SINGULAIR) 10 MG tablet, Take one 10mg  tab for 2 days before chemo (day 1 and day 2), Disp: 20 tablet, Rfl: 0   nitrofurantoin, macrocrystal-monohydrate, (MACROBID) 100 MG capsule, Take 1 capsule (100 mg total) by mouth 2 (two) times daily., Disp: 10 capsule, Rfl: 0   ondansetron (ZOFRAN) 8 MG tablet, Take 1 tablet (8 mg total) by mouth every 8 (eight) hours as needed for nausea or vomiting., Disp: 20 tablet, Rfl: 0   PERTUZUMAB IV, Inject into  the vein every 21 ( twenty-one) days., Disp: , Rfl:    predniSONE (DELTASONE) 20 MG tablet, Take one 20mg  tab for 2 days before chemo (day 1 and day 2), Disp: 20 tablet, Rfl: 0   TRASTUZUMAB IV, Inject into the vein every 21 ( twenty-one) days., Disp: , Rfl:  No current facility-administered medications for this visit.  Facility-Administered Medications Ordered in Other Visits:    0.9 %  sodium chloride infusion, , Intravenous, Continuous, Creig Hines, MD, Last Rate: 10 mL/hr at 12/02/22 0939, New Bag at 12/02/22 0939   CARBOplatin (PARAPLATIN) 370 mg in sodium chloride 0.9 % 100 mL chemo infusion, 370 mg, Intravenous, Once, Creig Hines, MD   DOCEtaxel (TAXOTERE) 80 mg in sodium chloride 0.9 % 250 mL chemo infusion, 60 mg/m2 (Treatment Plan Recorded), Intravenous, Once, Creig Hines, MD   heparin lock flush 100 unit/mL, 500 Units, Intracatheter, Once PRN, Creig Hines, MD   pertuzumab (PERJETA) 420 mg in sodium chloride 0.9 % 250 mL chemo infusion, 420 mg, Intravenous, Once, Creig Hines, MD   sodium chloride flush (NS) 0.9 % injection 10 mL, 10 mL, Intracatheter, PRN, Creig Hines, MD   trastuzumab-anns [facility]) 252 mg in sodium chloride 0.9 % 250 mL chemo infusion, 6 mg/kg (Treatment Plan Recorded), Intravenous, Once, Creig Hines, MD  Physical exam:  Vitals:   12/02/22 0836  BP: (!) 146/66  Pulse: 88  Temp: 97.7 F (36.5 C)  TempSrc: Tympanic  SpO2: 100%  Weight: 91 lb 4.8 oz (41.4 kg)   Physical Exam Cardiovascular:     Rate  and Rhythm: Normal rate and regular rhythm.     Heart sounds: Normal heart sounds.  Pulmonary:     Effort: Pulmonary effort is normal.     Breath sounds: Normal breath sounds.  Abdominal:     General: Bowel sounds are normal.     Palpations: Abdomen is soft.  Skin:    General: Skin is warm and dry.  Neurological:     Mental Status: She is alert and oriented to person, place, and time.        Latest Ref Rng & Units 12/02/2022     8:11 AM  CMP  Glucose 70 - 99 mg/dL 010   BUN 6 - 20 mg/dL 14   Creatinine 2.72 - 1.00 mg/dL 5.36   Sodium 644 - 034 mmol/L 137   Potassium 3.5 - 5.1 mmol/L 3.4   Chloride 98 - 111 mmol/L 100   CO2 22 - 32 mmol/L 27   Calcium 8.9 - 10.3 mg/dL 9.3   Total Protein 6.5 - 8.1 g/dL 6.8   Total Bilirubin <7.4 mg/dL 0.6   Alkaline Phos 38 - 126 U/L 72   AST 15 - 41 U/L 18   ALT 0 - 44 U/L 17       Latest Ref Rng & Units 12/02/2022    8:11 AM  CBC  WBC 4.0 - 10.5 K/uL 7.0   Hemoglobin 12.0 - 15.0 g/dL 25.9   Hematocrit 56.3 - 46.0 % 35.7   Platelets 150 - 400 K/uL 206     No images are attached to the encounter.  MR PELVIS W WO CONTRAST  Result Date: 11/04/2022 CLINICAL DATA:  Left adnexal mass identified by prior CT EXAM: MRI PELVIS WITHOUT AND WITH CONTRAST TECHNIQUE: Multiplanar multisequence MR imaging of the pelvis was performed both before and after administration of intravenous contrast. CONTRAST:  4mL GADAVIST GADOBUTROL 1 MMOL/ML IV SOLN COMPARISON:  CT abdomen pelvis, 10/31/2022, CT abdomen pelvis, 02/22/2018 FINDINGS: Urinary Tract:  No abnormality visualized. Bowel:  Unremarkable visualized pelvic bowel loops. Vascular/Lymphatic: No pathologically enlarged lymph nodes. No significant vascular abnormality seen. Reproductive: Normal uterus. Normal postmenopausal appearance of the right ovary. Large, lobulated left ovarian mass measuring 7.9 x 7.0 x 5.9 cm (series 4, image 18). This is heterogeneously T2 hypointense with irregular regions of internal T2 hyperintensity and poorly enhancing. Other:  Small volume free fluid throughout the pelvis. Musculoskeletal: No suspicious bone lesions identified. IMPRESSION: 1. Large, lobulated left ovarian mass measuring 7.9 x 7.0 x 5.9 cm. This is heterogeneously T2 hypointense with irregular regions of internal T2 hyperintensity and poorly enhancing. 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 In retrospective review, this lesion was present on remote prior CT dated 02/22/2018 and has significantly enlarged in size over the interval, demonstrating relatively indolent behavior. This is however nonetheless technically characterized as an O-RADS category 4 lesion and remains suspicious for solid malignancy. Recommend surgical referral. 2. Small volume free fluid throughout the pelvis. 3. No evidence of lymphadenopathy or metastatic disease in the pelvis. These results will be called to the ordering clinician or representative by the Radiologist Assistant, and communication documented in the PACS or [employer]. Electronically Signed   By: Jearld Lesch M.D.   On: 11/04/2022 18:52     Assessment and plan- Patient is a 57 y.o. female with history of BRCA2 mutation and second left breast cancer stage Ib MP T2 N0 versus pT1 cN1 M0 ER/PR positive HER2  positive.  She is here for on treatment assessment prior to cycle 3 of adjuvant TCHP chemotherapy  Counts okay to proceed with cycle 3 of adjuvant TCHP chemotherapy today.  I will be increasing docetaxel dose from 50 mg to 60 mg/m.  She is receiving carboplatin at AUC 5.  I will see her back in 3 weeks for cycle 4.  She will need echocardiogram prior.  I would like her to complete 6 cycles of adjuvant TCHP chemotherapy before proceeding for definitive surgery for her left adnexal/ovarian mass.  This was evaluated by GYN oncology and the likelihood of high-grade ovarian carcinoma is low given that she had the mass back in 2020 as well and has only slowly grown since then.  On pelvic exam this mass appeared hard and could be more consistent with other etiology such as myoma or fibroma. Moreover CA125 is normal as well.  Patient was also met with radiation oncology Dr. Basilio Cairo and was recommended adjuvant radiation down the line.  We could consider doing this after her surgery for left ovarian mass.  Patient gets UTI with  every cycle of chemotherapy and I am giving her a prescription for Keflex   Visit Diagnosis 1. Encounter for antineoplastic chemotherapy   2. Encounter for monoclonal antibody treatment for malignancy   3. Malignant neoplasm of upper-outer quadrant of left breast in female, estrogen receptor positive (HCC)   4. Ovarian mass, left      Dr. Owens Shark, MD, MPH [facility] [phone] 12/02/2022 11:05 AM

## 2022-12-03 ENCOUNTER — Encounter: Payer: Self-pay | Admitting: Oncology

## 2022-12-04 ENCOUNTER — Inpatient Hospital Stay: Payer: Commercial Managed Care - PPO

## 2022-12-04 ENCOUNTER — Encounter: Payer: Self-pay | Admitting: Oncology

## 2022-12-04 ENCOUNTER — Other Ambulatory Visit: Payer: Self-pay

## 2022-12-04 DIAGNOSIS — Z170 Estrogen receptor positive status [ER+]: Secondary | ICD-10-CM

## 2022-12-04 DIAGNOSIS — Z5112 Encounter for antineoplastic immunotherapy: Secondary | ICD-10-CM | POA: Diagnosis not present

## 2022-12-04 LAB — INHIBIN A: Inhibin-A: 10.3 pg/mL

## 2022-12-04 LAB — INHIBIN B: Inhibin B: 192.2 pg/mL

## 2022-12-04 MED ORDER — PEGFILGRASTIM INJECTION 6 MG/0.6ML ~~LOC~~
6.0000 mg | PREFILLED_SYRINGE | Freq: Once | SUBCUTANEOUS | Status: AC
  Administered 2022-12-04: 6 mg via SUBCUTANEOUS
  Filled 2022-12-04: qty 0.6

## 2022-12-04 NOTE — Progress Notes (Signed)
Nutrition Assessment   Reason for Assessment:  Receiving chemotherapy and BMI 16   ASSESSMENT:  57 year old female with left breast cancer, triple positive.  Patient has history of lumpectomy in 2008 and adjuvant radiation but no endocrine therapy.  Has BRACA2 mutation.  Patient receiving adjuvant TCHP chemotherapy.  Met with patient and friend, Melissa Rodriguez following injection.  Patient reports decreased intake the week of treatment but appetite, taste comes back for the last 2 weeks before next cycle.  Says that she snacks and eats all day.  Has coffee in the am, english muffin, cheese and fruit.  Likes cottage cheese, peanut butter, chicken, beef, pork, tuna, salmon, beans.  Nuts, eggs and dairy upset her stomach (uses lactaid products).  Drinks water, gatorade, juice, coffee, some soda.  Having issues with constipation.  Had mouth sores 1st cycle but none this time.  Feels like her side effects are being managed well.      Nutrition Focused Physical Exam:   deferred   Medications: zofran, Vit D, Tums, lomotil prenisone  Labs: K 3.4, glucose 103   Anthropometrics:   Height: 63 inches Weight: 91 lb 4.8 oz today UBW: 97 lb  10/6 99 lb 8/1 95 lb 6/27 99 lb BMI: 16  8% weight loss in the last month, significant   Estimated Energy Needs  Kcals: 1000-1230 Protein: 41-61.5 g Fluid: 1000-1230 ml   NUTRITION DIAGNOSIS: Inadequate oral intake related to cancer related treatment side effects as evidenced by 8% weight loss in the last month and treatment side effects (taste alterations, constipation/diarrhea).     INTERVENTION:  Discussed strategies to help with constipation Continue bowel regimen Encouraged foods high in calories and protein to prevent further weight loss.  Does not like milky oral nutrition supplements.  Discussed juice type shake options with patient. Contact information provided   MONITORING, EVALUATION, GOAL: weight trends, intake   Next Visit: Thursday,  Dec 12 phone call  Jeramyah Goodpasture B. Freida Busman, RD, LDN Registered Dietitian [phone]

## 2022-12-16 ENCOUNTER — Ambulatory Visit
Admission: RE | Admit: 2022-12-16 | Discharge: 2022-12-16 | Disposition: A | Discharge status: Home / Self Care | Payer: Commercial Managed Care - PPO | Source: Ambulatory Visit | Attending: Oncology | Admitting: Oncology

## 2022-12-16 DIAGNOSIS — C50412 Malignant neoplasm of upper-outer quadrant of left female breast: Secondary | ICD-10-CM | POA: Diagnosis present

## 2022-12-16 DIAGNOSIS — Z79630 Long term (current) use of alkylating agent: Secondary | ICD-10-CM | POA: Insufficient documentation

## 2022-12-16 DIAGNOSIS — Z0189 Encounter for other specified special examinations: Secondary | ICD-10-CM

## 2022-12-16 LAB — ECHOCARDIOGRAM COMPLETE
AR max vel: 2.7 cm2
AV Area VTI: 3.71 cm2
AV Area mean vel: 2.88 cm2
AV Mean grad: 2 mm[Hg]
AV Peak grad: 4.6 mm[Hg]
Ao pk vel: 1.07 m/s
Area-P 1/2: 5.75 cm2
S' Lateral: 2.6 cm

## 2022-12-16 NOTE — Progress Notes (Signed)
*  PRELIMINARY RESULTS* Echocardiogram 2D Echocardiogram has been performed.  Melissa Rodriguez 12/16/2022, 9:21 AM

## 2022-12-20 ENCOUNTER — Encounter: Payer: Self-pay | Admitting: Oncology

## 2022-12-20 MED FILL — Fosaprepitant Dimeglumine For IV Infusion 150 MG (Base Eq): INTRAVENOUS | Qty: 5 | Status: AC

## 2022-12-23 ENCOUNTER — Encounter: Payer: Self-pay | Admitting: Oncology

## 2022-12-23 ENCOUNTER — Inpatient Hospital Stay (HOSPITAL_BASED_OUTPATIENT_CLINIC_OR_DEPARTMENT_OTHER): Payer: Commercial Managed Care - PPO | Admitting: Oncology

## 2022-12-23 ENCOUNTER — Inpatient Hospital Stay: Payer: Commercial Managed Care - PPO

## 2022-12-23 ENCOUNTER — Inpatient Hospital Stay: Payer: Commercial Managed Care - PPO | Attending: Hematology

## 2022-12-23 VITALS — BP 133/57 | HR 92 | Temp 98.2°F | Resp 18 | Wt 92.0 lb

## 2022-12-23 DIAGNOSIS — Z5112 Encounter for antineoplastic immunotherapy: Secondary | ICD-10-CM

## 2022-12-23 DIAGNOSIS — Z170 Estrogen receptor positive status [ER+]: Secondary | ICD-10-CM | POA: Insufficient documentation

## 2022-12-23 DIAGNOSIS — C50412 Malignant neoplasm of upper-outer quadrant of left female breast: Secondary | ICD-10-CM

## 2022-12-23 DIAGNOSIS — Z1509 Genetic susceptibility to other malignant neoplasm: Secondary | ICD-10-CM | POA: Diagnosis not present

## 2022-12-23 DIAGNOSIS — D3912 Neoplasm of uncertain behavior of left ovary: Secondary | ICD-10-CM | POA: Insufficient documentation

## 2022-12-23 DIAGNOSIS — Z1501 Genetic susceptibility to malignant neoplasm of breast: Secondary | ICD-10-CM | POA: Insufficient documentation

## 2022-12-23 DIAGNOSIS — Z9013 Acquired absence of bilateral breasts and nipples: Secondary | ICD-10-CM | POA: Diagnosis not present

## 2022-12-23 DIAGNOSIS — R5383 Other fatigue: Secondary | ICD-10-CM | POA: Diagnosis not present

## 2022-12-23 DIAGNOSIS — Z148 Genetic carrier of other disease: Secondary | ICD-10-CM | POA: Diagnosis not present

## 2022-12-23 DIAGNOSIS — Z5111 Encounter for antineoplastic chemotherapy: Secondary | ICD-10-CM | POA: Diagnosis not present

## 2022-12-23 DIAGNOSIS — Z800 Family history of malignant neoplasm of digestive organs: Secondary | ICD-10-CM | POA: Diagnosis not present

## 2022-12-23 DIAGNOSIS — Z803 Family history of malignant neoplasm of breast: Secondary | ICD-10-CM | POA: Insufficient documentation

## 2022-12-23 DIAGNOSIS — Z5189 Encounter for other specified aftercare: Secondary | ICD-10-CM | POA: Diagnosis not present

## 2022-12-23 DIAGNOSIS — M810 Age-related osteoporosis without current pathological fracture: Secondary | ICD-10-CM | POA: Diagnosis not present

## 2022-12-23 LAB — CBC WITH DIFFERENTIAL (CANCER CENTER ONLY)
Abs Immature Granulocytes: 0.04 10*3/uL (ref 0.00–0.07)
Basophils Absolute: 0 10*3/uL (ref 0.0–0.1)
Basophils Relative: 0 %
Eosinophils Absolute: 0 10*3/uL (ref 0.0–0.5)
Eosinophils Relative: 0 %
HCT: 34.5 % — ABNORMAL LOW (ref 36.0–46.0)
Hemoglobin: 11.5 g/dL — ABNORMAL LOW (ref 12.0–15.0)
Immature Granulocytes: 0 %
Lymphocytes Relative: 15 %
Lymphs Abs: 1.4 10*3/uL (ref 0.7–4.0)
MCH: 30.6 pg (ref 26.0–34.0)
MCHC: 33.3 g/dL (ref 30.0–36.0)
MCV: 91.8 fL (ref 80.0–100.0)
Monocytes Absolute: 0.7 10*3/uL (ref 0.1–1.0)
Monocytes Relative: 8 %
Neutro Abs: 6.9 10*3/uL (ref 1.7–7.7)
Neutrophils Relative %: 77 %
Platelet Count: 196 10*3/uL (ref 150–400)
RBC: 3.76 MIL/uL — ABNORMAL LOW (ref 3.87–5.11)
RDW: 15.9 % — ABNORMAL HIGH (ref 11.5–15.5)
WBC Count: 9 10*3/uL (ref 4.0–10.5)
nRBC: 0 % (ref 0.0–0.2)

## 2022-12-23 LAB — CMP (CANCER CENTER ONLY)
ALT: 15 U/L (ref 0–44)
AST: 17 U/L (ref 15–41)
Albumin: 4.1 g/dL (ref 3.5–5.0)
Alkaline Phosphatase: 63 U/L (ref 38–126)
Anion gap: 10 (ref 5–15)
BUN: 17 mg/dL (ref 6–20)
CO2: 27 mmol/L (ref 22–32)
Calcium: 9.4 mg/dL (ref 8.9–10.3)
Chloride: 99 mmol/L (ref 98–111)
Creatinine: 0.57 mg/dL (ref 0.44–1.00)
GFR, Estimated: >60 mL/min (ref >60)
Glucose, Bld: 95 mg/dL (ref 70–99)
Potassium: 3.4 mmol/L — ABNORMAL LOW (ref 3.5–5.1)
Sodium: 136 mmol/L (ref 135–145)
Total Bilirubin: 0.4 mg/dL (ref <1.2)
Total Protein: 6.5 g/dL (ref 6.5–8.1)

## 2022-12-23 MED ORDER — PALONOSETRON HCL INJECTION 0.25 MG/5ML
0.2500 mg | Freq: Once | INTRAVENOUS | Status: AC
  Administered 2022-12-23: 0.25 mg via INTRAVENOUS
  Filled 2022-12-23: qty 5

## 2022-12-23 MED ORDER — SODIUM CHLORIDE 0.9 % IV SOLN
372.0000 mg | Freq: Once | INTRAVENOUS | Status: AC
  Administered 2022-12-23: 370 mg via INTRAVENOUS
  Filled 2022-12-23: qty 37

## 2022-12-23 MED ORDER — FAMOTIDINE IN NACL 20-0.9 MG/50ML-% IV SOLN
20.0000 mg | Freq: Once | INTRAVENOUS | Status: AC
  Administered 2022-12-23: 20 mg via INTRAVENOUS
  Filled 2022-12-23: qty 50

## 2022-12-23 MED ORDER — SODIUM CHLORIDE 0.9 % IV SOLN
INTRAVENOUS | Status: DC
  Filled 2022-12-23: qty 250

## 2022-12-23 MED ORDER — SODIUM CHLORIDE 0.9 % IV SOLN
50.0000 mg/m2 | Freq: Once | INTRAVENOUS | Status: AC
  Administered 2022-12-23: 68 mg via INTRAVENOUS
  Filled 2022-12-23: qty 6.8

## 2022-12-23 MED ORDER — DEXAMETHASONE SODIUM PHOSPHATE 10 MG/ML IJ SOLN
10.0000 mg | Freq: Once | INTRAMUSCULAR | Status: AC
Start: 2022-12-23 — End: 2022-12-23
  Administered 2022-12-23: 10 mg via INTRAVENOUS
  Filled 2022-12-23: qty 1

## 2022-12-23 MED ORDER — ACETAMINOPHEN 325 MG PO TABS
650.0000 mg | ORAL_TABLET | Freq: Once | ORAL | Status: AC
  Administered 2022-12-23: 650 mg via ORAL
  Filled 2022-12-23: qty 2

## 2022-12-23 MED ORDER — TRASTUZUMAB-ANNS CHEMO 420 MG IV SOLR
6.0000 mg/kg | Freq: Once | INTRAVENOUS | Status: AC
  Administered 2022-12-23: 252 mg via INTRAVENOUS
  Filled 2022-12-23: qty 12

## 2022-12-23 MED ORDER — SODIUM CHLORIDE 0.9 % IV SOLN
150.0000 mg | Freq: Once | INTRAVENOUS | Status: AC
  Administered 2022-12-23: 150 mg via INTRAVENOUS
  Filled 2022-12-23: qty 150

## 2022-12-23 MED ORDER — SODIUM CHLORIDE 0.9 % IV SOLN
420.0000 mg | Freq: Once | INTRAVENOUS | Status: AC
  Administered 2022-12-23: 420 mg via INTRAVENOUS
  Filled 2022-12-23: qty 14

## 2022-12-23 MED ORDER — CETIRIZINE HCL 10 MG/ML IV SOLN
10.0000 mg | Freq: Once | INTRAVENOUS | Status: AC
  Administered 2022-12-23: 10 mg via INTRAVENOUS
  Filled 2022-12-23: qty 1

## 2022-12-23 NOTE — Patient Instructions (Signed)
[facility] - A DEPT OF [facility]  Discharge Instructions: Thank you for choosing [facility] to provide your oncology and hematology care.  If you have a lab appointment with the Cancer Center, please go directly to the Cancer Center and check in at the registration area.  Wear comfortable clothing and clothing appropriate for easy access to any Portacath or PICC line.   We strive to give you quality time with your provider. You may need to reschedule your appointment if you arrive late (15 or more minutes).  Arriving late affects you and other patients whose appointments are after yours.  Also, if you miss three or more appointments without notifying the office, you may be dismissed from the clinic at the provider's discretion.      For prescription refill requests, have your pharmacy contact our office and allow 72 hours for refills to be completed.    Today you received the following chemotherapy and/or immunotherapy agents Kanjinti, Perjeta, Taxotere & Carboplatin      To help prevent nausea and vomiting after your treatment, we encourage you to take your nausea medication as directed.  BELOW ARE SYMPTOMS THAT SHOULD BE REPORTED IMMEDIATELY: *FEVER GREATER THAN 100.4 F (38 C) OR HIGHER *CHILLS OR SWEATING *NAUSEA AND VOMITING THAT IS NOT CONTROLLED WITH YOUR NAUSEA MEDICATION *UNUSUAL SHORTNESS OF BREATH *UNUSUAL BRUISING OR BLEEDING *URINARY PROBLEMS (pain or burning when urinating, or frequent urination) *BOWEL PROBLEMS (unusual diarrhea, constipation, pain near the anus) TENDERNESS IN MOUTH AND THROAT WITH OR WITHOUT PRESENCE OF ULCERS (sore throat, sores in mouth, or a toothache) UNUSUAL RASH, SWELLING OR PAIN  UNUSUAL VAGINAL DISCHARGE OR ITCHING   Items with * indicate a potential emergency and should be followed up as soon as possible or go to the Emergency Department if any problems should occur.  Please show the  CHEMOTHERAPY ALERT CARD or IMMUNOTHERAPY ALERT CARD at check-in to the Emergency Department and triage nurse.  Should you have questions after your visit or need to cancel or reschedule your appointment, please contact [facility] - A DEPT OF [facility]  [phone] and follow the prompts.  Office hours are 8:00 a.m. to 4:30 p.m. Monday - Friday. Please note that voicemails left after 4:00 p.m. may not be returned until the following business day.  We are closed weekends and major holidays. You have access to a nurse at all times for urgent questions. Please call the main number to the clinic [phone] and follow the prompts.  For any non-urgent questions, you may also contact your provider using MyChart. We now offer e-Visits for anyone 44 and older to request care online for non-urgent symptoms. For details visit [email or web address].   Also download the MyChart app! Go to the app store, search "MyChart", open the app, select [place], and log in with your MyChart username and password.

## 2022-12-23 NOTE — Progress Notes (Signed)
Hematology/Oncology Consult note [facility]  Telephone:([phone] [phone]  Patient Care Team: Babs Sciara, MD as PCP - General (Family Medicine) Doreatha Massed, MD as Medical Oncologist (Medical Oncology) Therese Sarah, RN as Oncology Nurse Navigator (Medical Oncology) Creig Hines, MD as Consulting Physician (Oncology) Benita Gutter, RN as Oncology Nurse Navigator   Name of the patient: Melissa Rodriguez  [id number]  [birth year]   Date of visit: 12/23/22  Diagnosis-  pathologic prognostic stage Ib invasive mammary carcinoma of the left breast T2 N1 M0 ER/PR positive HER2 positive   Chief complaint/ Reason for visit-on treatment assessment prior to cycle 4 of adjuvant TCHP chemotherapy  Heme/Onc history: Patient is a 57 year old female with a prior history of lumpectomy in 2008 which showed a 7 mm stage I ER/PR positive HER2 negative left breast cancer.  She underwent lumpectomy and adjuvant radiation therapy but did not go through endocrine therapy.   She self palpated a left breast lump led to a diagnostic mammogram in July 2024.  Diagnostic mammogram showed 1.9 cm irregular hypoechoic mass 1 o'clock position 7 cm from the breast grade 3 ER 99% positive, PR 85% positive and HER2 equivocal by IHC and FISH positive.  Ki-67 35% there were calcifications noted in the right breast as well which were biopsy-proven benign.     She underwent genetic testing and was found to have BRCA2 mutation   Patient was seen by Dr. Ellin Saba and he recommended upfront surgery and consideration for adjuvant chemotherapy.  Patient underwent bilateral mastectomy on 08/15/2022.  No evidence of malignancy in the right breast.  There were 2 distinct masses noted in the left breast with dominant mass measured 2.4 x 2.3 x 1.6 cm grade 3 with negative margins.  There was another 1 cm mass where there was uptake of radiotracer dye with positive posterior lateral  anterior margin.  This was believed to be the sentinel lymph node although on pathology it states that there were no lymph nodes identified likely due to prior lumpectomy. mpT2.  She was staged as T2 N1 triple positive disease.   She was recommended adjuvant TCHP chemotherapy and received cycle 1 on 10/14/2022  Interval history-patient had more fatigue as well as generalized bodyaches and chest wall pain after her last cycle of chemotherapy when she received a higher dose of docetaxel as compared to cycle 2.  She also had more gas and bloating symptoms.Appetite was poor and she lost 2 pounds as compared to last month  ECOG PS- 1 Pain scale- 0   Review of systems- Review of Systems  Constitutional:  Positive for malaise/fatigue. Negative for chills, fever and weight loss.  HENT:  Negative for congestion, ear discharge and nosebleeds.   Eyes:  Negative for blurred vision.  Respiratory:  Negative for cough, hemoptysis, sputum production, shortness of breath and wheezing.   Cardiovascular:  Negative for chest pain, palpitations, orthopnea and claudication.  Gastrointestinal:  Negative for abdominal pain, blood in stool, constipation, diarrhea, heartburn, melena, nausea and vomiting.  Genitourinary:  Negative for dysuria, flank pain, frequency, hematuria and urgency.  Musculoskeletal:  Negative for back pain, joint pain and myalgias.  Skin:  Negative for rash.  Neurological:  Negative for dizziness, tingling, focal weakness, seizures, weakness and headaches.  Endo/Heme/Allergies:  Does not bruise/bleed easily.  Psychiatric/Behavioral:  Negative for depression and suicidal ideas. The patient does not have insomnia.       Allergies  Allergen Reactions   Benadryl [  Diphenhydramine Hcl]     Jittery and Hallucinations   Diphenhydramine Hcl Anxiety    Other Reaction(s): Hallucination  Jittery and Hallucinations    Made her feel like she was crawling the walls.   Hydrocodone Shortness Of Breath     Felt like she could not breathe-February 2020   Penicillins Hives and Rash    Did it involve swelling of the face/tongue/throat, SOB, or low BP? No  Did it involve sudden or severe rash/hives, skin peeling, or any reaction on the inside of your mouth or nose? No  Did you need to seek medical attention at a hospital or doctor's office? No  When did it last happen?        If all above answers are "NO", may proceed with cephalosporin use.  Any medicine related to PCN    Did it involve swelling of the face/tongue/throat, SOB, or low BP? No Did it involve sudden or severe rash/hives, skin peeling, or any reaction on the inside of your mouth or nose? No Did you need to seek medical attention at a hospital or doctor's office? No When did it last happen?       If all above answers are "NO", may proceed with cephalosporin use.   Ciprofloxacin Hives   Docetaxel Other (See Comments)    Tingling of lips, teeth hurting, hot tongue   Trastuzumab-Anns [Trastuzumab-Pkrb] Itching    Tingling and hot tongue and lips     Past Medical History:  Diagnosis Date   Adenocarcinoma of breast (HCC) 05/20/2011   Stage I (T1b N0), grade 1, well-differentiated adenocarcinoma of the medial lower quadrant of the left breast. It was a tubular carcinoma, status post lumpectomy on 03/13/2006, ER positive 79%, PR positive 92%, HER2/neu negative, Ki-67 marker low at 18%. Her cancer was 7 mm in size, and she had 4 sentinel nodes all negative along with her lumpectomy. She was treated with radiation therapy postoper   Complication of anesthesia    pt states her bladder did not wake up after one surgery. Went to ER, had a urinary catheter for 1 week   History of kidney stones    Iron deficiency    Iron deficiency anemia 05/20/2011   Secondary to GU bleeding.  Good absorber of PO iron   Personal history of radiation therapy      Past Surgical History:  Procedure Laterality Date   BREAST BIOPSY Left 07/16/2022   [place]  LT BREAST BX W LOC DEV 1ST LESION IMG BX SPEC US GUIDE 07/16/2022 Edwin Cap, MD [facility]   BREAST BIOPSY Right 07/26/2022   MM RT BREAST BX W LOC DEV 1ST LESION IMAGE BX SPEC STEREO GUIDE 07/26/2022 [facility]   BREAST LUMPECTOMY Left    EXTRACORPOREAL SHOCK WAVE LITHOTRIPSY Left 02/26/2018   Procedure: EXTRACORPOREAL SHOCK WAVE LITHOTRIPSY (ESWL);  Surgeon: Malen Gauze, MD;  Location: [facility];  Service: Urology;  Laterality: Left;   LUMPECTOM LEFT BREAST  02/2006   LYMPH NODE BIOPSY LEFT  04/2006   MASTECTOMY W/ SENTINEL NODE BIOPSY Left 08/15/2022   Procedure: LEFT SIMPLE MASTECTOMY, LEFT SENTINEL LYMPH NODE MAPPING;  Surgeon: Harriette Bouillon, MD;  Location: [facility];  Service: General;  Laterality: Left;   PORTACATH PLACEMENT N/A 10/09/2022   Procedure: INSERTION PORT-A-CATH;  Surgeon: Harriette Bouillon, MD;  Location: [facility];  Service: General;  Laterality: N/A;   SIMPLE MASTECTOMY WITH AXILLARY SENTINEL NODE BIOPSY Right 08/15/2022   Procedure: RIGHT RISK REDUCING MASTECTOMY;  Surgeon: Harriette Bouillon, MD;  Location:  SURGERY CENTER;  Service: General;  Laterality: Right;    Social History   Socioeconomic History   Marital status: Married    Spouse name: Not on file   Number of children: Not on file   Years of education: Not on file   Highest education level: Some college, no degree  Occupational History   Not on file  Tobacco Use   Smoking status: Never   Smokeless tobacco: Never  Vaping Use   Vaping status: Never Used  Substance and Sexual Activity   Alcohol use: No   Drug use: No   Sexual activity: Yes    Birth control/protection: Post-menopausal  Other Topics Concern   Not on file  Social History Narrative   Not on file   Social Determinants of Health   Financial Resource Strain: Patient Declined (10/16/2022)   Overall Financial Resource Strain (CARDIA)    Difficulty of Paying Living Expenses: Patient  declined  Recent Concern: Physicist, medical Strain - High Risk (10/03/2022)   Overall Financial Resource Strain (CARDIA)    Difficulty of Paying Living Expenses: Hard  Food Insecurity: No Food Insecurity (11/27/2022)   Hunger Vital Sign    Worried About Running Out of Food in the Last Year: Never true    Ran Out of Food in the Last Year: Never true  Transportation Needs: No Transportation Needs (11/27/2022)   PRAPARE - Administrator, Civil Service (Medical): No    Lack of Transportation (Non-Medical): No  Physical Activity: Insufficiently Active (10/16/2022)   Exercise Vital Sign    Days of Exercise per Week: 1 day    Minutes of Exercise per Session: 20 min  Stress: No Stress Concern Present (10/16/2022)   [employer] of Occupational Health - Occupational Stress Questionnaire    Feeling of Stress : Not at all  Social Connections: Unknown (10/16/2022)   Social Connection and Isolation Panel [NHANES]    Frequency of Communication with Friends and Family: Patient declined    Frequency of Social Gatherings with Friends and Family: Patient declined    Attends Religious Services: More than 4 times per year    Active Member of [employer] or Organizations: Yes    Attends Engineer, structural: More than 4 times per year    Marital Status: Married  Catering manager Violence: Not At Risk (11/27/2022)   Humiliation, Afraid, Rape, and Kick questionnaire    Fear of Current or Ex-Partner: No    Emotionally Abused: No    Physically Abused: No    Sexually Abused: No    Family History  Problem Relation Age of Onset   Hypertension Mother    Hypertension Father    Heart disease Father 63       MI in his 56s   Cancer Maternal Aunt        unk type, possibly hip. dx >50   Breast cancer Paternal Aunt        dx 54s   Cancer Paternal Aunt        unknown, d. 37s   Prostate cancer Cousin        dx 78s-50s   Stomach cancer Cousin        dx 40s-50s     Current Outpatient  Medications:    aluminum-magnesium hydroxide 200-200 MG/5ML suspension, Take 15 mLs by mouth every 6 (six) hours as needed for indigestion. Mix 1:1 with Lidocaine and swish and spit 4 times a day, Disp:  355 mL, Rfl: 2   Bioflavonoid Products (GRAPE SEED PO), Take 1 capsule by mouth daily., Disp: , Rfl:    Calcium Carbonate Antacid (TUMS PO), Take 1 tablet by mouth as needed. Reported on 05/22/2015, Disp: , Rfl:    CARBOPLATIN IV, Inject into the vein every 21 ( twenty-one) days., Disp: , Rfl:    cephALEXin (KEFLEX) 500 MG capsule, Take 1 capsule (500 mg total) by mouth 4 (four) times daily., Disp: 40 capsule, Rfl: 0   Cholecalciferol (VITAMIN D PO), Take 1,000 Units by mouth daily. , Disp: , Rfl:    diphenoxylate-atropine (LOMOTIL) 2.5-0.025 MG tablet, Take 1 tablet by mouth 4 (four) times daily as needed for diarrhea or loose stools., Disp: 60 tablet, Rfl: 0   DOCETAXEL IV, Inject into the vein every 21 ( twenty-one) days., Disp: , Rfl:    lidocaine (XYLOCAINE) 2 % solution, Use as directed 15 mLs in the mouth or throat 4 (four) times daily. Mix 1:1 with maalox and swish and spit 4 times a day, Disp: 100 mL, Rfl: 2   lidocaine-prilocaine (EMLA) cream, Apply a quarter-sized amount to port a cath site and cover with plastic wrap 1 hour prior to infusion appointments, Disp: 30 g, Rfl: 3   loratadine (CLARITIN) 5 MG chewable tablet, Chew 5 mg by mouth daily., Disp: , Rfl:    montelukast (SINGULAIR) 10 MG tablet, Take one 10mg  tab for 2 days before chemo (day 1 and day 2), Disp: 20 tablet, Rfl: 0   nitrofurantoin, macrocrystal-monohydrate, (MACROBID) 100 MG capsule, Take 1 capsule (100 mg total) by mouth 2 (two) times daily., Disp: 10 capsule, Rfl: 0   ondansetron (ZOFRAN) 8 MG tablet, Take 1 tablet (8 mg total) by mouth every 8 (eight) hours as needed for nausea or vomiting., Disp: 20 tablet, Rfl: 0   PERTUZUMAB IV, Inject into the vein every 21 ( twenty-one) days., Disp: , Rfl:    predniSONE (DELTASONE)  20 MG tablet, Take one 20mg  tab for 2 days before chemo (day 1 and day 2), Disp: 20 tablet, Rfl: 0   TRASTUZUMAB IV, Inject into the vein every 21 ( twenty-one) days., Disp: , Rfl:  No current facility-administered medications for this visit.  Facility-Administered Medications Ordered in Other Visits:    0.9 %  sodium chloride infusion, , Intravenous, Continuous, Creig Hines, MD, Last Rate: 10 mL/hr at 12/23/22 1009, New Bag at 12/23/22 1009   CARBOplatin (PARAPLATIN) 370 mg in sodium chloride 0.9 % 100 mL chemo infusion, 370 mg, Intravenous, Once, Creig Hines, MD   DOCEtaxel (TAXOTERE) 68 mg in sodium chloride 0.9 % 150 mL chemo infusion, 50 mg/m2 (Treatment Plan Recorded), Intravenous, Once, Creig Hines, MD, Last Rate: 157 mL/hr at 12/23/22 1230, 68 mg at 12/23/22 1230  Physical exam:  Vitals:   12/23/22 0907 12/23/22 0911  BP: (!) 144/64 (!) 133/57  Pulse: 92   Resp: 18   Temp: 98.2 F (36.8 C)   TempSrc: Tympanic   SpO2: 100%   Weight: 92 lb (41.7 kg)    Physical Exam Cardiovascular:     Rate and Rhythm: Normal rate and regular rhythm.     Heart sounds: Normal heart sounds.  Pulmonary:     Effort: Pulmonary effort is normal.     Breath sounds: Normal breath sounds.  Skin:    General: Skin is warm and dry.  Neurological:     Mental Status: She is alert and oriented to person, place, and time.  Latest Ref Rng & Units 12/23/2022    8:45 AM  CMP  Glucose 70 - 99 mg/dL 95   BUN 6 - 20 mg/dL 17   Creatinine 5.62 - 1.00 mg/dL 1.30   Sodium 865 - 784 mmol/L 136   Potassium 3.5 - 5.1 mmol/L 3.4   Chloride 98 - 111 mmol/L 99   CO2 22 - 32 mmol/L 27   Calcium 8.9 - 10.3 mg/dL 9.4   Total Protein 6.5 - 8.1 g/dL 6.5   Total Bilirubin <6.9 mg/dL 0.4   Alkaline Phos 38 - 126 U/L 63   AST 15 - 41 U/L 17   ALT 0 - 44 U/L 15       Latest Ref Rng & Units 12/23/2022    8:45 AM  CBC  WBC 4.0 - 10.5 K/uL 9.0   Hemoglobin 12.0 - 15.0 g/dL 62.9   Hematocrit 52.8 -  46.0 % 34.5   Platelets 150 - 400 K/uL 196     No images are attached to the encounter.  ECHOCARDIOGRAM COMPLETE  Result Date: 12/16/2022    ECHOCARDIOGRAM REPORT   Patient Name:   CASSANDRA BEEHLER Date of Exam: 12/16/2022 Medical Rec #:  [id number]     Height:       63.0 in Accession #:    [id number]    Weight:       91.3 lb Date of Birth:  1966-01-15      BSA:          1.386 m Patient Age:    57 years      BP:           146/66 mmHg Patient Gender: F             HR:           88 bpm. Exam Location:  [facility] Procedure: 2D Echo, Cardiac Doppler, Color Doppler and Strain Analysis Indications:     Chemo Z09  History:         Patient has prior history of Echocardiogram examinations, most                  recent 08/12/2022. Cancer.  Sonographer:     Cristela Blue Referring Phys:  [id number] [facility] Christain Mcraney Diagnosing Phys: Lorine Bears MD  Sonographer Comments: Global longitudinal strain was attempted. IMPRESSIONS  1. Left ventricular ejection fraction, by estimation, is 60 to 65%. The left ventricle has normal function. The left ventricle has no regional wall motion abnormalities. Left ventricular diastolic parameters were normal.  2. Right ventricular systolic function is normal. The right ventricular size is normal. There is normal pulmonary artery systolic pressure.  3. The mitral valve is normal in structure. No evidence of mitral valve regurgitation. No evidence of mitral stenosis.  4. The aortic valve is normal in structure. Aortic valve regurgitation is not visualized. Aortic valve sclerosis is present, with no evidence of aortic valve stenosis.  5. The inferior vena cava is normal in size with greater than 50% respiratory variability, suggesting right atrial pressure of 3 mmHg. FINDINGS  Left Ventricle: Left ventricular ejection fraction, by estimation, is 60 to 65%. The left ventricle has normal function. The left ventricle has no regional wall motion abnormalities. Global longitudinal strain performed but not reported  based on interpreter judgement due to suboptimal tracking. The left ventricular internal cavity size was normal in size. There is no left ventricular hypertrophy. Left ventricular diastolic parameters were normal. Right Ventricle: The right ventricular size  is normal. No increase in right ventricular wall thickness. Right ventricular systolic function is normal. There is normal pulmonary artery systolic pressure. The tricuspid regurgitant velocity is 2.31 m/s, and  with an assumed right atrial pressure of 3 mmHg, the estimated right ventricular systolic pressure is 24.3 mmHg. Left Atrium: Left atrial size was normal in size. Right Atrium: Right atrial size was normal in size. Pericardium: There is no evidence of pericardial effusion. Mitral Valve: The mitral valve is normal in structure. No evidence of mitral valve regurgitation. No evidence of mitral valve stenosis. Tricuspid Valve: The tricuspid valve is normal in structure. Tricuspid valve regurgitation is trivial. No evidence of tricuspid stenosis. Aortic Valve: The aortic valve is normal in structure. Aortic valve regurgitation is not visualized. Aortic valve sclerosis is present, with no evidence of aortic valve stenosis. Aortic valve mean gradient measures 2.0 mmHg. Aortic valve peak gradient measures 4.6 mmHg. Aortic valve area, by VTI measures 3.71 cm. Pulmonic Valve: The pulmonic valve was normal in structure. Pulmonic valve regurgitation is not visualized. No evidence of pulmonic stenosis. Aorta: The aortic root is normal in size and structure. Venous: The inferior vena cava is normal in size with greater than 50% respiratory variability, suggesting right atrial pressure of 3 mmHg. IAS/Shunts: No atrial level shunt detected by color flow Doppler.  LEFT VENTRICLE PLAX 2D LVIDd:         3.90 cm   Diastology LVIDs:         2.60 cm   LV e' medial:    11.30 cm/s LV PW:         0.90 cm   LV E/e' medial:  7.6 LV IVS:        0.90 cm   LV e' lateral:   12.30 cm/s  LVOT diam:     2.00 cm   LV E/e' lateral: 6.9 LV SV:         59 LV SV Index:   43 LVOT Area:     3.14 cm  RIGHT VENTRICLE RV Basal diam:  2.40 cm RV Mid diam:    2.00 cm RV S prime:     12.50 cm/s TAPSE (M-mode): 2.5 cm LEFT ATRIUM             Index       RIGHT ATRIUM          Index LA diam:        1.80 cm 1.30 cm/m  RA Area:     8.65 cm LA Vol (A2C):   9.5 ml  6.85 ml/m  RA Volume:   16.80 ml 12.12 ml/m LA Vol (A4C):   11.6 ml 8.37 ml/m LA Biplane Vol: 10.9 ml 7.87 ml/m  AORTIC VALVE AV Area (Vmax):    2.70 cm AV Area (Vmean):   2.88 cm AV Area (VTI):     3.71 cm AV Vmax:           107.00 cm/s AV Vmean:          64.100 cm/s AV VTI:            0.159 m AV Peak Grad:      4.6 mmHg AV Mean Grad:      2.0 mmHg LVOT Vmax:         91.80 cm/s LVOT Vmean:        58.800 cm/s LVOT VTI:          0.188 m LVOT/AV VTI ratio: 1.18 MITRAL VALVE  TRICUSPID VALVE MV Area (PHT): 5.75 cm    TR Peak grad:   21.3 mmHg MV Decel Time: 132 msec    TR Vmax:        231.00 cm/s MV E velocity: 85.40 cm/s MV A velocity: 66.60 cm/s  SHUNTS MV E/A ratio:  1.28        Systemic VTI:  0.19 m                            Systemic Diam: 2.00 cm Lorine Bears MD Electronically signed by Lorine Bears MD Signature Date/Time: 12/16/2022/1:14:08 PM    Final      Assessment and plan- Patient is a 57 y.o. female with history of BRCA2 mutation and second left breast cancer stage Ib MP T2 N0 versus pT1 cN1 M0 ER/PR positive HER2 positive.  She is here for on treatment assessment prior to cycle 4 of adjuvant TCHP chemotherapy  Counts okay to proceed with cycle 4 of adjuvant TCHP chemotherapy today.  I am backing down the dose of docetaxel to 50 mg/m since patient had more side effects with 60 mg/m dosing.  She will be seen by covering provider in 3 weeks for cycle 5 and I will see her back in 6 weeks for cycle 6 which would be her last cycle.  Following that she will need to proceed with surgery for her ovarian mass before  considering postmastectomy radiation.  Chemo-induced anemia: Overall stable continue to monitor   Visit Diagnosis 1. Malignant neoplasm of upper-outer quadrant of left breast in female, estrogen receptor positive (HCC)   2. Encounter for antineoplastic chemotherapy   3. Encounter for monoclonal antibody treatment for malignancy      Dr. Owens Shark, MD, MPH [facility] [phone] 12/23/2022 12:35 PM

## 2022-12-25 ENCOUNTER — Inpatient Hospital Stay: Payer: Commercial Managed Care - PPO

## 2022-12-25 DIAGNOSIS — C50412 Malignant neoplasm of upper-outer quadrant of left female breast: Secondary | ICD-10-CM

## 2022-12-25 DIAGNOSIS — Z5112 Encounter for antineoplastic immunotherapy: Secondary | ICD-10-CM | POA: Diagnosis not present

## 2022-12-25 MED ORDER — PEGFILGRASTIM INJECTION 6 MG/0.6ML ~~LOC~~
6.0000 mg | PREFILLED_SYRINGE | Freq: Once | SUBCUTANEOUS | Status: AC
  Administered 2022-12-25: 6 mg via SUBCUTANEOUS
  Filled 2022-12-25: qty 0.6

## 2022-12-26 ENCOUNTER — Inpatient Hospital Stay: Payer: Commercial Managed Care - PPO

## 2022-12-26 NOTE — Progress Notes (Signed)
Nutrition Follow-up:  Patient with left breast cancer, triple positive.  Received TCHP chemotherapy.   Spoke with patient via phone for nutrition follow-up.  Patient reports feeling like she has the flu due to injection and stomach upset.  Had issues with constipation and took miralax.  Had bowel movement and feeling some better.  Able to eat 1/2 piece of peanut butter toast, cheese stick and dry cereal this am.  Last night able to eat some soup.  Has not tried "juice type" shakes.  Does not like "milky" shakes and juice does not really sound good to her.  Appetite usually improves in the 2 weeks prior to coming back for treatment.  Eating chicken, tuna, beef, cheese, peanut butter, cottage cheese for protein.     Medications: reviewed  Labs: reviewed  Anthropometrics:   Weight 92 lb on 12/9  91 lb 4.8 oz on 11/20 UBW of 97 lb   NUTRITION DIAGNOSIS: Inadequate oral intake ongoing with treatment    INTERVENTION:  Encouraged small frequent meals/snacks of foods rich in protein and calories to help maintain weight Encouraged use of medication to to improve symptoms and allow her to eat.    MONITORING, EVALUATION, GOAL: weight trends, intake   NEXT VISIT: Thursday, Jan 23rd phone call  Gwendolyn Mclees B. Freida Busman, RD, LDN Registered Dietitian [phone]

## 2022-12-27 ENCOUNTER — Telehealth: Payer: Self-pay | Admitting: *Deleted

## 2022-12-27 NOTE — Telephone Encounter (Signed)
Patient called reporting that she had infusion Monday and injection Wed and today she has diarrhea for which she has taken Imodium. She is having cramping in he left side where the mass is located and just wanted Dr Smith Robert to know and is asking if this is expected. Please advise

## 2022-12-27 NOTE — Telephone Encounter (Signed)
CAll returned to patient and informed of doctor response and patient in agreement and states that the Imodium seems to nbe working

## 2022-12-27 NOTE — Telephone Encounter (Signed)
Cramping likely related to diarrhea. If imodium doesn't help we can send lomotil. If pain/ cramping gets worse, she may have to go to ER over the weekend.if overall she is stable she can call us on Monday and let us know how she is doing

## 2023-01-03 ENCOUNTER — Telehealth: Payer: Self-pay | Admitting: *Deleted

## 2023-01-03 NOTE — Telephone Encounter (Signed)
Patient called to report that since last month, she has intermittent cramps that fell like menstrual cramping not requiring any medicine but she wanted doctor to be aware that she is having this occur.

## 2023-01-10 ENCOUNTER — Telehealth: Payer: Self-pay | Admitting: *Deleted

## 2023-01-10 MED FILL — Fosaprepitant Dimeglumine For IV Infusion 150 MG (Base Eq): INTRAVENOUS | Qty: 5 | Status: AC

## 2023-01-10 NOTE — Telephone Encounter (Signed)
Can you please call her and decide if she needs to be seen in [facility]

## 2023-01-10 NOTE — Telephone Encounter (Signed)
Patient reports diarrhea this AM and has notice some blood in stool.

## 2023-01-13 ENCOUNTER — Encounter: Payer: Self-pay | Admitting: Nurse Practitioner

## 2023-01-13 ENCOUNTER — Inpatient Hospital Stay: Payer: Commercial Managed Care - PPO

## 2023-01-13 ENCOUNTER — Inpatient Hospital Stay (HOSPITAL_BASED_OUTPATIENT_CLINIC_OR_DEPARTMENT_OTHER): Payer: Commercial Managed Care - PPO | Admitting: Nurse Practitioner

## 2023-01-13 VITALS — BP 124/63 | HR 82 | Resp 18

## 2023-01-13 VITALS — BP 134/67 | HR 94 | Temp 99.1°F | Wt 89.6 lb

## 2023-01-13 DIAGNOSIS — Z170 Estrogen receptor positive status [ER+]: Secondary | ICD-10-CM

## 2023-01-13 DIAGNOSIS — K625 Hemorrhage of anus and rectum: Secondary | ICD-10-CM

## 2023-01-13 DIAGNOSIS — E638 Other specified nutritional deficiencies: Secondary | ICD-10-CM

## 2023-01-13 DIAGNOSIS — Z5112 Encounter for antineoplastic immunotherapy: Secondary | ICD-10-CM

## 2023-01-13 DIAGNOSIS — Z5111 Encounter for antineoplastic chemotherapy: Secondary | ICD-10-CM | POA: Diagnosis not present

## 2023-01-13 DIAGNOSIS — C50412 Malignant neoplasm of upper-outer quadrant of left female breast: Secondary | ICD-10-CM | POA: Diagnosis not present

## 2023-01-13 LAB — CMP (CANCER CENTER ONLY)
ALT: 13 U/L (ref 0–44)
AST: 17 U/L (ref 15–41)
Albumin: 4.2 g/dL (ref 3.5–5.0)
Alkaline Phosphatase: 65 U/L (ref 38–126)
Anion gap: 10 (ref 5–15)
BUN: 17 mg/dL (ref 6–20)
CO2: 26 mmol/L (ref 22–32)
Calcium: 9.2 mg/dL (ref 8.9–10.3)
Chloride: 100 mmol/L (ref 98–111)
Creatinine: 0.55 mg/dL (ref 0.44–1.00)
GFR, Estimated: >60 mL/min (ref >60)
Glucose, Bld: 101 mg/dL — ABNORMAL HIGH (ref 70–99)
Potassium: 3.4 mmol/L — ABNORMAL LOW (ref 3.5–5.1)
Sodium: 136 mmol/L (ref 135–145)
Total Bilirubin: 0.5 mg/dL (ref <1.2)
Total Protein: 6.5 g/dL (ref 6.5–8.1)

## 2023-01-13 LAB — CBC WITH DIFFERENTIAL (CANCER CENTER ONLY)
Abs Immature Granulocytes: 0.02 10*3/uL (ref 0.00–0.07)
Basophils Absolute: 0 10*3/uL (ref 0.0–0.1)
Basophils Relative: 0 %
Eosinophils Absolute: 0 10*3/uL (ref 0.0–0.5)
Eosinophils Relative: 0 %
HCT: 35.9 % — ABNORMAL LOW (ref 36.0–46.0)
Hemoglobin: 12 g/dL (ref 12.0–15.0)
Immature Granulocytes: 0 %
Lymphocytes Relative: 15 %
Lymphs Abs: 1.1 10*3/uL (ref 0.7–4.0)
MCH: 31 pg (ref 26.0–34.0)
MCHC: 33.4 g/dL (ref 30.0–36.0)
MCV: 92.8 fL (ref 80.0–100.0)
Monocytes Absolute: 0.5 10*3/uL (ref 0.1–1.0)
Monocytes Relative: 7 %
Neutro Abs: 5.6 10*3/uL (ref 1.7–7.7)
Neutrophils Relative %: 78 %
Platelet Count: 183 10*3/uL (ref 150–400)
RBC: 3.87 MIL/uL (ref 3.87–5.11)
RDW: 15.8 % — ABNORMAL HIGH (ref 11.5–15.5)
WBC Count: 7.2 10*3/uL (ref 4.0–10.5)
nRBC: 0 % (ref 0.0–0.2)

## 2023-01-13 MED ORDER — ACETAMINOPHEN 325 MG PO TABS
650.0000 mg | ORAL_TABLET | Freq: Once | ORAL | Status: AC
  Administered 2023-01-13: 650 mg via ORAL
  Filled 2023-01-13: qty 2

## 2023-01-13 MED ORDER — SODIUM CHLORIDE 0.9 % IV SOLN
372.0000 mg | Freq: Once | INTRAVENOUS | Status: AC
  Administered 2023-01-13: 370 mg via INTRAVENOUS
  Filled 2023-01-13: qty 37

## 2023-01-13 MED ORDER — SODIUM CHLORIDE 0.9 % IV SOLN
INTRAVENOUS | Status: DC
  Filled 2023-01-13: qty 250

## 2023-01-13 MED ORDER — PALONOSETRON HCL INJECTION 0.25 MG/5ML
0.2500 mg | Freq: Once | INTRAVENOUS | Status: AC
  Administered 2023-01-13: 0.25 mg via INTRAVENOUS
  Filled 2023-01-13: qty 5

## 2023-01-13 MED ORDER — SODIUM CHLORIDE 0.9 % IV SOLN
420.0000 mg | Freq: Once | INTRAVENOUS | Status: AC
  Administered 2023-01-13: 420 mg via INTRAVENOUS
  Filled 2023-01-13: qty 14

## 2023-01-13 MED ORDER — TRASTUZUMAB-ANNS CHEMO 150 MG IV SOLR
6.0000 mg/kg | Freq: Once | INTRAVENOUS | Status: AC
  Administered 2023-01-13: 252 mg via INTRAVENOUS
  Filled 2023-01-13: qty 12

## 2023-01-13 MED ORDER — CETIRIZINE HCL 10 MG/ML IV SOLN
10.0000 mg | Freq: Once | INTRAVENOUS | Status: AC
  Administered 2023-01-13: 10 mg via INTRAVENOUS
  Filled 2023-01-13: qty 1

## 2023-01-13 MED ORDER — FAMOTIDINE IN NACL 20-0.9 MG/50ML-% IV SOLN
20.0000 mg | Freq: Once | INTRAVENOUS | Status: AC
  Administered 2023-01-13: 20 mg via INTRAVENOUS
  Filled 2023-01-13: qty 50

## 2023-01-13 MED ORDER — SODIUM CHLORIDE 0.9 % IV SOLN
50.0000 mg/m2 | Freq: Once | INTRAVENOUS | Status: AC
  Administered 2023-01-13: 68 mg via INTRAVENOUS
  Filled 2023-01-13: qty 6.8

## 2023-01-13 MED ORDER — DEXAMETHASONE SODIUM PHOSPHATE 10 MG/ML IJ SOLN
10.0000 mg | Freq: Once | INTRAMUSCULAR | Status: AC
  Administered 2023-01-13: 10 mg via INTRAVENOUS
  Filled 2023-01-13: qty 1

## 2023-01-13 MED ORDER — FOSAPREPITANT DIMEGLUMINE INJECTION 150 MG
150.0000 mg | Freq: Once | INTRAVENOUS | Status: AC
  Administered 2023-01-13: 150 mg via INTRAVENOUS
  Filled 2023-01-13: qty 150

## 2023-01-13 MED ORDER — HEPARIN SOD (PORK) LOCK FLUSH 100 UNIT/ML IV SOLN
500.0000 [IU] | Freq: Once | INTRAVENOUS | Status: AC | PRN
  Administered 2023-01-13: 500 [IU]
  Filled 2023-01-13: qty 5

## 2023-01-13 NOTE — Progress Notes (Signed)
Hematology/Oncology Consult Note [facility]  Telephone:([phone] [phone]  Patient Care Team: Babs Sciara, MD as PCP - General (Family Medicine) Doreatha Massed, MD as Medical Oncologist (Medical Oncology) Therese Sarah, RN as Oncology Nurse Navigator (Medical Oncology) Creig Hines, MD as Consulting Physician (Oncology) Benita Gutter, RN as Oncology Nurse Navigator   Name of the patient: Melissa Rodriguez  [id number]  07/06/1965   Date of visit: 01/13/23  Diagnosis-  pathologic prognostic stage Ib invasive mammary carcinoma of the left breast T2 N1 M0 ER/PR positive HER2 positive   Chief complaint/ Reason for visit-on treatment assessment prior to cycle 5 of adjuvant TCHP chemotherapy  Heme/Onc history: Patient is a 57 year old female with a prior history of lumpectomy in 2008 which showed a 7 mm stage I ER/PR positive HER2 negative left breast cancer.  She underwent lumpectomy and adjuvant radiation therapy but did not go through endocrine therapy.   She self palpated a left breast lump led to a diagnostic mammogram in July 2024.  Diagnostic mammogram showed 1.9 cm irregular hypoechoic mass 1 o'clock position 7 cm from the breast grade 3 ER 99% positive, PR 85% positive and HER2 equivocal by IHC and FISH positive.  Ki-67 35% there were calcifications noted in the right breast as well which were biopsy-proven benign.     She underwent genetic testing and was found to have BRCA2 mutation   Patient was seen by Dr. Ellin Saba and he recommended upfront surgery and consideration for adjuvant chemotherapy.  Patient underwent bilateral mastectomy on 08/15/2022.  No evidence of malignancy in the right breast.  There were 2 distinct masses noted in the left breast with dominant mass measured 2.4 x 2.3 x 1.6 cm grade 3 with negative margins.  There was another 1 cm mass where there was uptake of radiotracer dye with positive posterior lateral  anterior margin.  This was believed to be the sentinel lymph node although on pathology it states that there were no lymph nodes identified likely due to prior lumpectomy. mpT2.  She was staged as T2 N1 triple positive disease.   She was recommended adjuvant TCHP chemotherapy and received cycle 1 on 10/14/2022  Interval history- Has lost more weight. Aches and pains are improved. She had some bright red blood in her stool last week after diarrhea but now resolved. Denies abdominal pain. Has some pulling at site of her ovarian mass but intermittent. Not able to tolerate nutritional supplements d/t diarrhea & abdominal pain.   ECOG PS- 1 Pain scale- 0   Review of systems- Review of Systems  Constitutional:  Positive for malaise/fatigue and weight loss. Negative for chills and fever.  HENT:  Negative for congestion, ear discharge and nosebleeds.   Eyes:  Negative for blurred vision.  Respiratory:  Negative for cough, hemoptysis, sputum production, shortness of breath and wheezing.   Cardiovascular:  Negative for chest pain, palpitations, orthopnea and claudication.  Gastrointestinal:  Positive for blood in stool and diarrhea. Negative for abdominal pain, constipation, heartburn, melena, nausea and vomiting.  Genitourinary:  Negative for dysuria, flank pain, frequency, hematuria and urgency.  Musculoskeletal:  Negative for back pain, joint pain and myalgias.  Skin:  Negative for rash.  Neurological:  Negative for dizziness, tingling, focal weakness, seizures, weakness and headaches.  Endo/Heme/Allergies:  Does not bruise/bleed easily.  Psychiatric/Behavioral:  Negative for depression and suicidal ideas. The patient does not have insomnia.       Allergies  Allergen Reactions  Benadryl [Diphenhydramine Hcl]     Jittery and Hallucinations   Diphenhydramine Hcl Anxiety    Other Reaction(s): Hallucination  Jittery and Hallucinations    Made her feel like she was crawling the walls.    Hydrocodone Shortness Of Breath    Felt like she could not breathe-February 2020   Penicillins Hives and Rash    Did it involve swelling of the face/tongue/throat, SOB, or low BP? No  Did it involve sudden or severe rash/hives, skin peeling, or any reaction on the inside of your mouth or nose? No  Did you need to seek medical attention at a hospital or doctor's office? No  When did it last happen?        If all above answers are "NO", may proceed with cephalosporin use.  Any medicine related to PCN    Did it involve swelling of the face/tongue/throat, SOB, or low BP? No Did it involve sudden or severe rash/hives, skin peeling, or any reaction on the inside of your mouth or nose? No Did you need to seek medical attention at a hospital or doctor's office? No When did it last happen?       If all above answers are "NO", may proceed with cephalosporin use.   Ciprofloxacin Hives   Docetaxel Other (See Comments)    Tingling of lips, teeth hurting, hot tongue   Trastuzumab-Anns [Trastuzumab-Pkrb] Itching    Tingling and hot tongue and lips     Past Medical History:  Diagnosis Date   Adenocarcinoma of breast (HCC) 05/20/2011   Stage I (T1b N0), grade 1, well-differentiated adenocarcinoma of the medial lower quadrant of the left breast. It was a tubular carcinoma, status post lumpectomy on 03/13/2006, ER positive 79%, PR positive 92%, HER2/neu negative, Ki-67 marker low at 18%. Her cancer was 7 mm in size, and she had 4 sentinel nodes all negative along with her lumpectomy. She was treated with radiation therapy postoper   Complication of anesthesia    pt states her bladder did not wake up after one surgery. Went to ER, had a urinary catheter for 1 week   History of kidney stones    Iron deficiency    Iron deficiency anemia 05/20/2011   Secondary to GU bleeding.  Good absorber of PO iron   Personal history of radiation therapy      Past Surgical History:  Procedure Laterality Date    BREAST BIOPSY Left 07/16/2022   [place] LT BREAST BX W LOC DEV 1ST LESION IMG BX SPEC US GUIDE 07/16/2022 Edwin Cap, MD [facility]   BREAST BIOPSY Right 07/26/2022   MM RT BREAST BX W LOC DEV 1ST LESION IMAGE BX SPEC STEREO GUIDE 07/26/2022 [facility]   BREAST LUMPECTOMY Left    EXTRACORPOREAL SHOCK WAVE LITHOTRIPSY Left 02/26/2018   Procedure: EXTRACORPOREAL SHOCK WAVE LITHOTRIPSY (ESWL);  Surgeon: Malen Gauze, MD;  Location: [facility];  Service: Urology;  Laterality: Left;   LUMPECTOM LEFT BREAST  02/2006   LYMPH NODE BIOPSY LEFT  04/2006   MASTECTOMY W/ SENTINEL NODE BIOPSY Left 08/15/2022   Procedure: LEFT SIMPLE MASTECTOMY, LEFT SENTINEL LYMPH NODE MAPPING;  Surgeon: Harriette Bouillon, MD;  Location: [facility];  Service: General;  Laterality: Left;   PORTACATH PLACEMENT N/A 10/09/2022   Procedure: INSERTION PORT-A-CATH;  Surgeon: Harriette Bouillon, MD;  Location: [facility];  Service: General;  Laterality: N/A;   SIMPLE MASTECTOMY WITH AXILLARY SENTINEL NODE BIOPSY Right 08/15/2022   Procedure: RIGHT RISK REDUCING MASTECTOMY;  Surgeon: Harriette Bouillon, MD;  Location: [facility];  Service: General;  Laterality: Right;    Social History   Socioeconomic History   Marital status: Married    Spouse name: Not on file   Number of children: Not on file   Years of education: Not on file   Highest education level: Some college, no degree  Occupational History   Not on file  Tobacco Use   Smoking status: Never   Smokeless tobacco: Never  Vaping Use   Vaping status: Never Used  Substance and Sexual Activity   Alcohol use: No   Drug use: No   Sexual activity: Yes    Birth control/protection: Post-menopausal  Other Topics Concern   Not on file  Social History Narrative   Not on file   Social Drivers of Health   Financial Resource Strain: Patient Declined (10/16/2022)   Overall Financial Resource Strain (CARDIA)    Difficulty of Paying  Living Expenses: Patient declined  Recent Concern: Physicist, medical Strain - High Risk (10/03/2022)   Overall Financial Resource Strain (CARDIA)    Difficulty of Paying Living Expenses: Hard  Food Insecurity: No Food Insecurity (11/27/2022)   Hunger Vital Sign    Worried About Running Out of Food in the Last Year: Never true    Ran Out of Food in the Last Year: Never true  Transportation Needs: No Transportation Needs (11/27/2022)   PRAPARE - Administrator, Civil Service (Medical): No    Lack of Transportation (Non-Medical): No  Physical Activity: Insufficiently Active (10/16/2022)   Exercise Vital Sign    Days of Exercise per Week: 1 day    Minutes of Exercise per Session: 20 min  Stress: No Stress Concern Present (10/16/2022)   [employer] of Occupational Health - Occupational Stress Questionnaire    Feeling of Stress : Not at all  Social Connections: Unknown (10/16/2022)   Social Connection and Isolation Panel [NHANES]    Frequency of Communication with Friends and Family: Patient declined    Frequency of Social Gatherings with Friends and Family: Patient declined    Attends Religious Services: More than 4 times per year    Active Member of [employer] or Organizations: Yes    Attends Engineer, structural: More than 4 times per year    Marital Status: Married  Catering manager Violence: Not At Risk (11/27/2022)   Humiliation, Afraid, Rape, and Kick questionnaire    Fear of Current or Ex-Partner: No    Emotionally Abused: No    Physically Abused: No    Sexually Abused: No    Family History  Problem Relation Age of Onset   Hypertension Mother    Hypertension Father    Heart disease Father 42       MI in his 50s   Cancer Maternal Aunt        unk type, possibly hip. dx >50   Breast cancer Paternal Aunt        dx 55s   Cancer Paternal Aunt        unknown, d. 55s   Prostate cancer Cousin        dx 54s-50s   Stomach cancer Cousin        dx 40s-50s      Current Outpatient Medications:    aluminum-magnesium hydroxide 200-200 MG/5ML suspension, Take 15 mLs by mouth every 6 (six) hours as needed for indigestion. Mix 1:1 with Lidocaine and swish and spit 4 times a day, Disp:  355 mL, Rfl: 2   Bioflavonoid Products (GRAPE SEED PO), Take 1 capsule by mouth daily., Disp: , Rfl:    Calcium Carbonate Antacid (TUMS PO), Take 1 tablet by mouth as needed. Reported on 05/22/2015, Disp: , Rfl:    CARBOPLATIN IV, Inject into the vein every 21 ( twenty-one) days., Disp: , Rfl:    cephALEXin (KEFLEX) 500 MG capsule, Take 1 capsule (500 mg total) by mouth 4 (four) times daily., Disp: 40 capsule, Rfl: 0   Cholecalciferol (VITAMIN D PO), Take 1,000 Units by mouth daily. , Disp: , Rfl:    diphenoxylate-atropine (LOMOTIL) 2.5-0.025 MG tablet, Take 1 tablet by mouth 4 (four) times daily as needed for diarrhea or loose stools., Disp: 60 tablet, Rfl: 0   DOCETAXEL IV, Inject into the vein every 21 ( twenty-one) days., Disp: , Rfl:    lidocaine (XYLOCAINE) 2 % solution, Use as directed 15 mLs in the mouth or throat 4 (four) times daily. Mix 1:1 with maalox and swish and spit 4 times a day, Disp: 100 mL, Rfl: 2   lidocaine-prilocaine (EMLA) cream, Apply a quarter-sized amount to port a cath site and cover with plastic wrap 1 hour prior to infusion appointments, Disp: 30 g, Rfl: 3   loratadine (CLARITIN) 5 MG chewable tablet, Chew 5 mg by mouth daily., Disp: , Rfl:    montelukast (SINGULAIR) 10 MG tablet, Take one 10mg  tab for 2 days before chemo (day 1 and day 2), Disp: 20 tablet, Rfl: 0   nitrofurantoin, macrocrystal-monohydrate, (MACROBID) 100 MG capsule, Take 1 capsule (100 mg total) by mouth 2 (two) times daily., Disp: 10 capsule, Rfl: 0   ondansetron (ZOFRAN) 8 MG tablet, Take 1 tablet (8 mg total) by mouth every 8 (eight) hours as needed for nausea or vomiting., Disp: 20 tablet, Rfl: 0   PERTUZUMAB IV, Inject into the vein every 21 ( twenty-one) days., Disp: , Rfl:     predniSONE (DELTASONE) 20 MG tablet, Take one 20mg  tab for 2 days before chemo (day 1 and day 2), Disp: 20 tablet, Rfl: 0   TRASTUZUMAB IV, Inject into the vein every 21 ( twenty-one) days., Disp: , Rfl:   Physical exam:  Vitals:   01/13/23 0844  BP: 134/67  Pulse: 94  Temp: 99.1 F (37.3 C)  TempSrc: Tympanic  SpO2: 100%  Weight: 89 lb 9.6 oz (40.6 kg)   Physical Exam Vitals reviewed.  Constitutional:      Appearance: She is not ill-appearing.     Comments: Thin build. Unaccompanied.   Cardiovascular:     Rate and Rhythm: Normal rate and regular rhythm.  Pulmonary:     Effort: Pulmonary effort is normal.  Abdominal:     General: There is no distension.     Tenderness: There is no abdominal tenderness.  Skin:    General: Skin is warm and dry.     Coloration: Skin is not pale.  Neurological:     Mental Status: She is alert and oriented to person, place, and time.  Psychiatric:        Mood and Affect: Mood normal.        Behavior: Behavior normal.         Latest Ref Rng & Units 01/13/2023    8:33 AM  CMP  Glucose 70 - 99 mg/dL 409   BUN 6 - 20 mg/dL 17   Creatinine 8.11 - 1.00 mg/dL 9.14   Sodium 782 - 956 mmol/L 136   Potassium 3.5 -  5.1 mmol/L 3.4   Chloride 98 - 111 mmol/L 100   CO2 22 - 32 mmol/L 26   Calcium 8.9 - 10.3 mg/dL 9.2   Total Protein 6.5 - 8.1 g/dL 6.5   Total Bilirubin <8.4 mg/dL 0.5   Alkaline Phos 38 - 126 U/L 65   AST 15 - 41 U/L 17   ALT 0 - 44 U/L 13       Latest Ref Rng & Units 01/13/2023    8:33 AM  CBC  WBC 4.0 - 10.5 K/uL 7.2   Hemoglobin 12.0 - 15.0 g/dL 69.6   Hematocrit 29.5 - 46.0 % 35.9   Platelets 150 - 400 K/uL 183    ECHOCARDIOGRAM COMPLETE Result Date: 12/16/2022    ECHOCARDIOGRAM REPORT   Patient Name:   ALANDRIA ROUSEY Date of Exam: 12/16/2022 Medical Rec #:  [id number]     Height:       63.0 in Accession #:    [id number]    Weight:       91.3 lb Date of Birth:  09/09/1965      BSA:          1.386 m Patient Age:    57  years      BP:           146/66 mmHg Patient Gender: F             HR:           88 bpm. Exam Location:  [facility] Procedure: 2D Echo, Cardiac Doppler, Color Doppler and Strain Analysis Indications:     Chemo Z09  History:         Patient has prior history of Echocardiogram examinations, most                  recent 08/12/2022. Cancer.  Sonographer:     Cristela Blue Referring Phys:  [id number] [facility] C RAO Diagnosing Phys: Lorine Bears MD  Sonographer Comments: Global longitudinal strain was attempted. IMPRESSIONS  1. Left ventricular ejection fraction, by estimation, is 60 to 65%. The left ventricle has normal function. The left ventricle has no regional wall motion abnormalities. Left ventricular diastolic parameters were normal.  2. Right ventricular systolic function is normal. The right ventricular size is normal. There is normal pulmonary artery systolic pressure.  3. The mitral valve is normal in structure. No evidence of mitral valve regurgitation. No evidence of mitral stenosis.  4. The aortic valve is normal in structure. Aortic valve regurgitation is not visualized. Aortic valve sclerosis is present, with no evidence of aortic valve stenosis.  5. The inferior vena cava is normal in size with greater than 50% respiratory variability, suggesting right atrial pressure of 3 mmHg. FINDINGS  Left Ventricle: Left ventricular ejection fraction, by estimation, is 60 to 65%. The left ventricle has normal function. The left ventricle has no regional wall motion abnormalities. Global longitudinal strain performed but not reported based on interpreter judgement due to suboptimal tracking. The left ventricular internal cavity size was normal in size. There is no left ventricular hypertrophy. Left ventricular diastolic parameters were normal. Right Ventricle: The right ventricular size is normal. No increase in right ventricular wall thickness. Right ventricular systolic function is normal. There is normal pulmonary artery  systolic pressure. The tricuspid regurgitant velocity is 2.31 m/s, and  with an assumed right atrial pressure of 3 mmHg, the estimated right ventricular systolic pressure is 24.3 mmHg. Left Atrium: Left atrial size was normal in size. Right  Atrium: Right atrial size was normal in size. Pericardium: There is no evidence of pericardial effusion. Mitral Valve: The mitral valve is normal in structure. No evidence of mitral valve regurgitation. No evidence of mitral valve stenosis. Tricuspid Valve: The tricuspid valve is normal in structure. Tricuspid valve regurgitation is trivial. No evidence of tricuspid stenosis. Aortic Valve: The aortic valve is normal in structure. Aortic valve regurgitation is not visualized. Aortic valve sclerosis is present, with no evidence of aortic valve stenosis. Aortic valve mean gradient measures 2.0 mmHg. Aortic valve peak gradient measures 4.6 mmHg. Aortic valve area, by VTI measures 3.71 cm. Pulmonic Valve: The pulmonic valve was normal in structure. Pulmonic valve regurgitation is not visualized. No evidence of pulmonic stenosis. Aorta: The aortic root is normal in size and structure. Venous: The inferior vena cava is normal in size with greater than 50% respiratory variability, suggesting right atrial pressure of 3 mmHg. IAS/Shunts: No atrial level shunt detected by color flow Doppler.  LEFT VENTRICLE PLAX 2D LVIDd:         3.90 cm   Diastology LVIDs:         2.60 cm   LV e' medial:    11.30 cm/s LV PW:         0.90 cm   LV E/e' medial:  7.6 LV IVS:        0.90 cm   LV e' lateral:   12.30 cm/s LVOT diam:     2.00 cm   LV E/e' lateral: 6.9 LV SV:         59 LV SV Index:   43 LVOT Area:     3.14 cm  RIGHT VENTRICLE RV Basal diam:  2.40 cm RV Mid diam:    2.00 cm RV S prime:     12.50 cm/s TAPSE (M-mode): 2.5 cm LEFT ATRIUM             Index       RIGHT ATRIUM          Index LA diam:        1.80 cm 1.30 cm/m  RA Area:     8.65 cm LA Vol (A2C):   9.5 ml  6.85 ml/m  RA Volume:   16.80  ml 12.12 ml/m LA Vol (A4C):   11.6 ml 8.37 ml/m LA Biplane Vol: 10.9 ml 7.87 ml/m  AORTIC VALVE AV Area (Vmax):    2.70 cm AV Area (Vmean):   2.88 cm AV Area (VTI):     3.71 cm AV Vmax:           107.00 cm/s AV Vmean:          64.100 cm/s AV VTI:            0.159 m AV Peak Grad:      4.6 mmHg AV Mean Grad:      2.0 mmHg LVOT Vmax:         91.80 cm/s LVOT Vmean:        58.800 cm/s LVOT VTI:          0.188 m LVOT/AV VTI ratio: 1.18 MITRAL VALVE               TRICUSPID VALVE MV Area (PHT): 5.75 cm    TR Peak grad:   21.3 mmHg MV Decel Time: 132 msec    TR Vmax:        231.00 cm/s MV E velocity: 85.40 cm/s MV A velocity: 66.60 cm/s  SHUNTS MV  E/A ratio:  1.28        Systemic VTI:  0.19 m                            Systemic Diam: 2.00 cm Lorine Bears MD Electronically signed by Lorine Bears MD Signature Date/Time: 12/16/2022/1:14:08 PM    Final      Assessment and plan- Patient is a 57 y.o. female   Breast Cancer- history of BRCA2 mutation and second left breast cancer stage Ib MP T2 N0 versus pT1 cN1 M0 ER/PR positive HER2 positive. Plan is to receive 6 cycles of adjuvant chemotherapy. She will then proceed with surgery for her ovarian mass prior to post-mastectomy radiation Treatment assessment prior to cycle 5 of adjuvant TCHP chemotherapy. Labs reviewed and acceptable for treatment. Docetaxel dose reduced to 50 mg/m2 d/t side effects at 60 mg/m2 dose. Improved tolerance.    Ovarian Mass- plan for surgery after completing chemotherapy with Gyn Onc Chemotherapy induced anemia- hmg normal today Weight loss- encouraged soluble fiber with supplemental protein & calories during diarrhea episodes.  Rectal bleeding- hx of hemorrhoids. Resolved. Monitor for now.   Disposition:  Tx today F/u as scheduled- la    Visit Diagnosis 1. Encounter for antineoplastic chemotherapy   2. Encounter for monoclonal antibody treatment for malignancy   3. Malignant neoplasm of upper-outer quadrant of left breast  in female, estrogen receptor positive (HCC)   4. Imbalanced nutrition   5. Bright red rectal bleeding     Consuello Masse, DNP, AGNP-C, AOCNP [facility] [phone] (clinic) 01/13/2023

## 2023-01-13 NOTE — Patient Instructions (Signed)

## 2023-01-16 ENCOUNTER — Inpatient Hospital Stay: Payer: BLUE CROSS/BLUE SHIELD | Attending: Hematology

## 2023-01-16 ENCOUNTER — Inpatient Hospital Stay (HOSPITAL_BASED_OUTPATIENT_CLINIC_OR_DEPARTMENT_OTHER): Payer: BLUE CROSS/BLUE SHIELD | Admitting: Hospice and Palliative Medicine

## 2023-01-16 ENCOUNTER — Encounter: Payer: Self-pay | Admitting: Oncology

## 2023-01-16 ENCOUNTER — Encounter: Payer: Self-pay | Admitting: Hospice and Palliative Medicine

## 2023-01-16 VITALS — BP 131/42 | HR 108 | Temp 99.1°F | Resp 18 | Wt 89.6 lb

## 2023-01-16 VITALS — BP 131/42 | HR 108 | Temp 99.1°F | Ht 63.0 in | Wt 89.0 lb

## 2023-01-16 DIAGNOSIS — Z5111 Encounter for antineoplastic chemotherapy: Secondary | ICD-10-CM | POA: Diagnosis present

## 2023-01-16 DIAGNOSIS — Z1509 Genetic susceptibility to other malignant neoplasm: Secondary | ICD-10-CM | POA: Diagnosis not present

## 2023-01-16 DIAGNOSIS — Z803 Family history of malignant neoplasm of breast: Secondary | ICD-10-CM | POA: Insufficient documentation

## 2023-01-16 DIAGNOSIS — Z170 Estrogen receptor positive status [ER+]: Secondary | ICD-10-CM | POA: Insufficient documentation

## 2023-01-16 DIAGNOSIS — Z5112 Encounter for antineoplastic immunotherapy: Secondary | ICD-10-CM | POA: Insufficient documentation

## 2023-01-16 DIAGNOSIS — G893 Neoplasm related pain (acute) (chronic): Secondary | ICD-10-CM | POA: Diagnosis not present

## 2023-01-16 DIAGNOSIS — M810 Age-related osteoporosis without current pathological fracture: Secondary | ICD-10-CM | POA: Insufficient documentation

## 2023-01-16 DIAGNOSIS — Z148 Genetic carrier of other disease: Secondary | ICD-10-CM | POA: Diagnosis not present

## 2023-01-16 DIAGNOSIS — R5383 Other fatigue: Secondary | ICD-10-CM | POA: Insufficient documentation

## 2023-01-16 DIAGNOSIS — R634 Abnormal weight loss: Secondary | ICD-10-CM | POA: Insufficient documentation

## 2023-01-16 DIAGNOSIS — Z5189 Encounter for other specified aftercare: Secondary | ICD-10-CM | POA: Insufficient documentation

## 2023-01-16 DIAGNOSIS — C50412 Malignant neoplasm of upper-outer quadrant of left female breast: Secondary | ICD-10-CM | POA: Insufficient documentation

## 2023-01-16 DIAGNOSIS — Z1501 Genetic susceptibility to malignant neoplasm of breast: Secondary | ICD-10-CM | POA: Diagnosis not present

## 2023-01-16 DIAGNOSIS — D3912 Neoplasm of uncertain behavior of left ovary: Secondary | ICD-10-CM | POA: Insufficient documentation

## 2023-01-16 DIAGNOSIS — Z9013 Acquired absence of bilateral breasts and nipples: Secondary | ICD-10-CM | POA: Diagnosis not present

## 2023-01-16 DIAGNOSIS — Z800 Family history of malignant neoplasm of digestive organs: Secondary | ICD-10-CM | POA: Insufficient documentation

## 2023-01-16 MED ORDER — PEGFILGRASTIM INJECTION 6 MG/0.6ML ~~LOC~~
6.0000 mg | PREFILLED_SYRINGE | Freq: Once | SUBCUTANEOUS | Status: AC
Start: 2023-01-16 — End: 2023-01-16
  Administered 2023-01-16: 6 mg via SUBCUTANEOUS
  Filled 2023-01-16: qty 0.6

## 2023-01-16 NOTE — Progress Notes (Signed)
 Symptom Management Clinic [facility] at [facility] [phone] [phone]  Patient Care Team: Alphonsa Glendia LABOR, MD as PCP - General (Family Medicine) Rogers Hai, MD as Medical Oncologist (Medical Oncology) Celestia Joesph SQUIBB, RN as Oncology Nurse Navigator (Medical Oncology) Melissa Annah BROCKS, MD as Consulting Physician (Oncology) Maurie Rayfield BIRCH, RN as Oncology Nurse Navigator   NAME OF PATIENT: Melissa Rodriguez  [id number]  July 11, 1965   DATE OF VISIT: 01/16/23  REASON FOR CONSULT: Melissa Rodriguez is a 58 y.o. female with multiple medical problems including stage Ib ER/PR positive HER2 positive left breast cancer.  Patient underwent bilateral mastectomy on 08/15/2022.  Patient now on adjuvant TCHP chemotherapy.  INTERVAL HISTORY: Patient received cycle 4 TCHP chemotherapy on 01/13/2023.  Patient requested [facility] visit today to discuss symptom management.  She says that she has felt fatigued and tired post chemotherapy.  She reports strong appetite but a little oral intake.  She also has some left lower quadrant pain at site of known ovarian mass.  Patient is taking acetaminophen  with good improvement in pain.  Denies any neurologic complaints. Denies recent fevers or illnesses. Denies any easy bleeding or bruising. Denies chest pain. Denies any nausea, vomiting, constipation, or diarrhea. Denies urinary complaints. Patient offers no further specific complaints today.   PAST MEDICAL HISTORY: Past Medical History:  Diagnosis Date   Adenocarcinoma of breast (HCC) 05/20/2011   Stage I (T1b N0), grade 1, well-differentiated adenocarcinoma of the medial lower quadrant of the left breast. It was a tubular carcinoma, status post lumpectomy on 03/13/2006, ER positive 79%, PR positive 92%, HER2/neu negative, Ki-67 marker low at 18%. Her cancer was 7 mm in size, and she had 4 sentinel nodes all negative along with her lumpectomy. She was treated with  radiation therapy postoper   Complication of anesthesia    pt states her bladder did not wake up after one surgery. Went to ER, had a urinary catheter for 1 week   History of kidney stones    Iron deficiency    Iron deficiency anemia 05/20/2011   Secondary to GU bleeding.  Good absorber of PO iron   Personal history of radiation therapy     PAST SURGICAL HISTORY:  Past Surgical History:  Procedure Laterality Date   BREAST BIOPSY Left 07/16/2022   US  LT BREAST BX W LOC DEV 1ST LESION IMG BX SPEC US  GUIDE 07/16/2022 Lennon Nest, MD [facility]   BREAST BIOPSY Right 07/26/2022   MM RT BREAST BX W LOC DEV 1ST LESION IMAGE BX SPEC STEREO GUIDE 07/26/2022 [facility]   BREAST LUMPECTOMY Left    EXTRACORPOREAL SHOCK WAVE LITHOTRIPSY Left 02/26/2018   Procedure: EXTRACORPOREAL SHOCK WAVE LITHOTRIPSY (ESWL);  Surgeon: Sherrilee Belvie CROME, MD;  Location: [facility];  Service: Urology;  Laterality: Left;   LUMPECTOM LEFT BREAST  02/2006   LYMPH NODE BIOPSY LEFT  04/2006   MASTECTOMY W/ SENTINEL NODE BIOPSY Left 08/15/2022   Procedure: LEFT SIMPLE MASTECTOMY, LEFT SENTINEL LYMPH NODE MAPPING;  Surgeon: Vanderbilt Ned, MD;  Location: [facility];  Service: General;  Laterality: Left;   PORTACATH PLACEMENT N/A 10/09/2022   Procedure: INSERTION PORT-A-CATH;  Surgeon: Vanderbilt Ned, MD;  Location: [facility];  Service: General;  Laterality: N/A;   SIMPLE MASTECTOMY WITH AXILLARY SENTINEL NODE BIOPSY Right 08/15/2022   Procedure: RIGHT RISK REDUCING MASTECTOMY;  Surgeon: Vanderbilt Ned, MD;  Location: [facility];  Service: General;  Laterality: Right;  HEMATOLOGY/ONCOLOGY HISTORY:  Oncology History  Breast cancer of upper-outer quadrant of left female breast (HCC)  07/31/2022 Initial Diagnosis   Breast cancer of upper-outer quadrant of left female breast (HCC)   10/14/2022 - 10/16/2022 Chemotherapy   Patient is on Treatment Plan : BREAST  Docetaxel  +  Carboplatin  + Trastuzumab  + Pertuzumab   (TCHP) q21d / Trastuzumab  + Pertuzumab  q21d     11/11/2022 -  Chemotherapy   Patient is on Treatment Plan : BREAST  Docetaxel  + Carboplatin  + Trastuzumab  + Pertuzumab   (TCHP) q21d / Trastuzumab  + Pertuzumab  q21d       ALLERGIES:  is allergic to benadryl  [diphenhydramine  hcl], diphenhydramine  hcl, hydrocodone , penicillins, ciprofloxacin, docetaxel , and trastuzumab -anns [trastuzumab -pkrb].  MEDICATIONS:  Current Outpatient Medications  Medication Sig Dispense Refill   aluminum -magnesium  hydroxide 200-200 MG/5ML suspension Take 15 mLs by mouth every 6 (six) hours as needed for indigestion. Mix 1:1 with Lidocaine  and swish and spit 4 times a day 355 mL 2   Bioflavonoid Products (GRAPE SEED PO) Take 1 capsule by mouth daily.     Calcium Carbonate Antacid (TUMS PO) Take 1 tablet by mouth as needed. Reported on 05/22/2015     CARBOPLATIN  IV Inject into the vein every 21 ( twenty-one) days.     cephALEXin  (KEFLEX ) 500 MG capsule Take 1 capsule (500 mg total) by mouth 4 (four) times daily. 40 capsule 0   Cholecalciferol (VITAMIN D  PO) Take 1,000 Units by mouth daily.      diphenoxylate -atropine  (LOMOTIL ) 2.5-0.025 MG tablet Take 1 tablet by mouth 4 (four) times daily as needed for diarrhea or loose stools. 60 tablet 0   DOCETAXEL  IV Inject into the vein every 21 ( twenty-one) days.     lidocaine  (XYLOCAINE ) 2 % solution Use as directed 15 mLs in the mouth or throat 4 (four) times daily. Mix 1:1 with maalox and swish and spit 4 times a day 100 mL 2   lidocaine -prilocaine  (EMLA ) cream Apply a quarter-sized amount to port a cath site and cover with plastic wrap 1 hour prior to infusion appointments 30 g 3   loratadine  (CLARITIN ) 5 MG chewable tablet Chew 5 mg by mouth daily.     montelukast  (SINGULAIR ) 10 MG tablet Take one 10mg  tab for 2 days before chemo (day 1 and day 2) 20 tablet 0   nitrofurantoin , macrocrystal-monohydrate, (MACROBID ) 100 MG capsule Take 1  capsule (100 mg total) by mouth 2 (two) times daily. 10 capsule 0   ondansetron  (ZOFRAN ) 8 MG tablet Take 1 tablet (8 mg total) by mouth every 8 (eight) hours as needed for nausea or vomiting. 20 tablet 0   PERTUZUMAB  IV Inject into the vein every 21 ( twenty-one) days.     predniSONE  (DELTASONE ) 20 MG tablet Take one 20mg  tab for 2 days before chemo (day 1 and day 2) 20 tablet 0   TRASTUZUMAB  IV Inject into the vein every 21 ( twenty-one) days.     No current facility-administered medications for this visit.    VITAL SIGNS: LMP 07/05/2012  There were no vitals filed for this visit.  Estimated body mass index is 15.87 kg/m as calculated from the following:   Height as of 11/27/22: 5' 3 (1.6 m).   Weight as of an earlier encounter on 01/16/23: 89 lb 9.6 oz (40.6 kg).  LABS: CBC:    Component Value Date/Time   WBC 7.2 01/13/2023 0833   WBC 23.9 (H) 10/31/2022 0739   HGB 12.0 01/13/2023 0833   HCT 35.9 (L)  01/13/2023 0833   PLT 183 01/13/2023 0833   MCV 92.8 01/13/2023 0833   NEUTROABS 5.6 01/13/2023 0833   LYMPHSABS 1.1 01/13/2023 0833   MONOABS 0.5 01/13/2023 0833   EOSABS 0.0 01/13/2023 0833   BASOSABS 0.0 01/13/2023 0833   Comprehensive Metabolic Panel:    Component Value Date/Time   NA 136 01/13/2023 0833   K 3.4 (L) 01/13/2023 0833   CL 100 01/13/2023 0833   CO2 26 01/13/2023 0833   BUN 17 01/13/2023 0833   CREATININE 0.55 01/13/2023 0833   GLUCOSE 101 (H) 01/13/2023 0833   CALCIUM 9.2 01/13/2023 0833   AST 17 01/13/2023 0833   ALT 13 01/13/2023 0833   ALKPHOS 65 01/13/2023 0833   BILITOT 0.5 01/13/2023 0833   PROT 6.5 01/13/2023 0833   ALBUMIN 4.2 01/13/2023 0833    RADIOGRAPHIC STUDIES: No results found.  PERFORMANCE STATUS (ECOG) : 1 - Symptomatic but completely ambulatory  Review of Systems Unless otherwise noted, a complete review of systems is negative.  Physical Exam General: NAD Cardiovascular: regular rate and rhythm Pulmonary: clear ant  fields Abdomen: soft, nontender, + bowel sounds GU: no suprapubic tenderness Extremities: no edema, no joint deformities Skin: no rashes Neurological: Weakness but otherwise nonfocal  IMPRESSION/PLAN: Breast cancer -on TCHP chemotherapy.  Patient had questions about stopping chemotherapy early with patient was strongly encouraged to continue regimen as directed by Dr. Melanee.  Weight loss-weight down 3 pounds over past 3 weeks to current weight of 89 pounds.  BMI 15.  Patient followed by dietitian.  Patient declined offer of trial of an appetite stimulant.  Neoplasm related pain -good improvement with acetaminophen   Patient expressed understanding and was in agreement with this plan. She also understands that She can call clinic at any time with any questions, concerns, or complaints.   Thank you for allowing me to participate in the care of this very pleasant patient.   Time Total: 20 minutes  Visit consisted of counseling and education dealing with the complex and emotionally intense issues of symptom management in the setting of serious illness.Greater than 50%  of this time was spent counseling and coordinating care related to the above assessment and plan.  Signed by: Fonda Mower, PhD, NP-C

## 2023-01-31 ENCOUNTER — Encounter: Payer: Self-pay | Admitting: Oncology

## 2023-01-31 MED FILL — Fosaprepitant Dimeglumine For IV Infusion 150 MG (Base Eq): INTRAVENOUS | Qty: 5 | Status: AC

## 2023-02-03 ENCOUNTER — Inpatient Hospital Stay: Payer: BLUE CROSS/BLUE SHIELD

## 2023-02-03 ENCOUNTER — Inpatient Hospital Stay (HOSPITAL_BASED_OUTPATIENT_CLINIC_OR_DEPARTMENT_OTHER): Payer: BLUE CROSS/BLUE SHIELD | Admitting: Oncology

## 2023-02-03 VITALS — BP 143/80 | HR 91 | Temp 97.5°F | Resp 16 | Wt 86.0 lb

## 2023-02-03 DIAGNOSIS — C50412 Malignant neoplasm of upper-outer quadrant of left female breast: Secondary | ICD-10-CM

## 2023-02-03 DIAGNOSIS — Z5111 Encounter for antineoplastic chemotherapy: Secondary | ICD-10-CM

## 2023-02-03 DIAGNOSIS — Z170 Estrogen receptor positive status [ER+]: Secondary | ICD-10-CM

## 2023-02-03 DIAGNOSIS — K521 Toxic gastroenteritis and colitis: Secondary | ICD-10-CM

## 2023-02-03 DIAGNOSIS — Z5112 Encounter for antineoplastic immunotherapy: Secondary | ICD-10-CM | POA: Diagnosis not present

## 2023-02-03 LAB — CMP (CANCER CENTER ONLY)
ALT: 12 U/L (ref 0–44)
AST: 14 U/L — ABNORMAL LOW (ref 15–41)
Albumin: 4.1 g/dL (ref 3.5–5.0)
Alkaline Phosphatase: 70 U/L (ref 38–126)
Anion gap: 9 (ref 5–15)
BUN: 18 mg/dL (ref 6–20)
CO2: 28 mmol/L (ref 22–32)
Calcium: 9.4 mg/dL (ref 8.9–10.3)
Chloride: 99 mmol/L (ref 98–111)
Creatinine: 0.54 mg/dL (ref 0.44–1.00)
GFR, Estimated: >60 mL/min (ref >60)
Glucose, Bld: 99 mg/dL (ref 70–99)
Potassium: 3.3 mmol/L — ABNORMAL LOW (ref 3.5–5.1)
Sodium: 136 mmol/L (ref 135–145)
Total Bilirubin: 0.4 mg/dL (ref 0.0–1.2)
Total Protein: 6.6 g/dL (ref 6.5–8.1)

## 2023-02-03 LAB — CBC WITH DIFFERENTIAL (CANCER CENTER ONLY)
Abs Immature Granulocytes: 0.04 10*3/uL (ref 0.00–0.07)
Basophils Absolute: 0 10*3/uL (ref 0.0–0.1)
Basophils Relative: 0 %
Eosinophils Absolute: 0 10*3/uL (ref 0.0–0.5)
Eosinophils Relative: 0 %
HCT: 35.1 % — ABNORMAL LOW (ref 36.0–46.0)
Hemoglobin: 11.9 g/dL — ABNORMAL LOW (ref 12.0–15.0)
Immature Granulocytes: 1 %
Lymphocytes Relative: 15 %
Lymphs Abs: 1.1 10*3/uL (ref 0.7–4.0)
MCH: 31.7 pg (ref 26.0–34.0)
MCHC: 33.9 g/dL (ref 30.0–36.0)
MCV: 93.6 fL (ref 80.0–100.0)
Monocytes Absolute: 0.6 10*3/uL (ref 0.1–1.0)
Monocytes Relative: 8 %
Neutro Abs: 5.2 10*3/uL (ref 1.7–7.7)
Neutrophils Relative %: 76 %
Platelet Count: 167 10*3/uL (ref 150–400)
RBC: 3.75 MIL/uL — ABNORMAL LOW (ref 3.87–5.11)
RDW: 15 % (ref 11.5–15.5)
WBC Count: 7 10*3/uL (ref 4.0–10.5)
nRBC: 0 % (ref 0.0–0.2)

## 2023-02-03 MED ORDER — SODIUM CHLORIDE 0.9 % IV SOLN
420.0000 mg | Freq: Once | INTRAVENOUS | Status: AC
  Administered 2023-02-03: 420 mg via INTRAVENOUS
  Filled 2023-02-03: qty 14

## 2023-02-03 MED ORDER — TRASTUZUMAB-ANNS CHEMO 150 MG IV SOLR
6.0000 mg/kg | Freq: Once | INTRAVENOUS | Status: AC
  Administered 2023-02-03: 252 mg via INTRAVENOUS
  Filled 2023-02-03: qty 12

## 2023-02-03 MED ORDER — CETIRIZINE HCL 10 MG/ML IV SOLN
10.0000 mg | Freq: Once | INTRAVENOUS | Status: AC
  Administered 2023-02-03: 10 mg via INTRAVENOUS
  Filled 2023-02-03: qty 1

## 2023-02-03 MED ORDER — ACETAMINOPHEN 325 MG PO TABS
650.0000 mg | ORAL_TABLET | Freq: Once | ORAL | Status: AC
  Administered 2023-02-03: 650 mg via ORAL
  Filled 2023-02-03: qty 2

## 2023-02-03 MED ORDER — PALONOSETRON HCL INJECTION 0.25 MG/5ML
0.2500 mg | Freq: Once | INTRAVENOUS | Status: AC
  Administered 2023-02-03: 0.25 mg via INTRAVENOUS
  Filled 2023-02-03: qty 5

## 2023-02-03 MED ORDER — SODIUM CHLORIDE 0.9 % IV SOLN
INTRAVENOUS | Status: DC
  Filled 2023-02-03: qty 250

## 2023-02-03 MED ORDER — SODIUM CHLORIDE 0.9 % IV SOLN
372.0000 mg | Freq: Once | INTRAVENOUS | Status: AC
  Administered 2023-02-03: 370 mg via INTRAVENOUS
  Filled 2023-02-03: qty 37

## 2023-02-03 MED ORDER — DEXAMETHASONE SODIUM PHOSPHATE 10 MG/ML IJ SOLN
10.0000 mg | Freq: Once | INTRAMUSCULAR | Status: AC
  Administered 2023-02-03: 10 mg via INTRAVENOUS
  Filled 2023-02-03: qty 1

## 2023-02-03 MED ORDER — FAMOTIDINE IN NACL 20-0.9 MG/50ML-% IV SOLN
20.0000 mg | Freq: Once | INTRAVENOUS | Status: AC
  Administered 2023-02-03: 20 mg via INTRAVENOUS
  Filled 2023-02-03: qty 50

## 2023-02-03 MED ORDER — SODIUM CHLORIDE 0.9 % IV SOLN
50.0000 mg/m2 | Freq: Once | INTRAVENOUS | Status: AC
  Administered 2023-02-03: 68 mg via INTRAVENOUS
  Filled 2023-02-03: qty 6.8

## 2023-02-03 MED ORDER — SODIUM CHLORIDE 0.9 % IV SOLN
150.0000 mg | Freq: Once | INTRAVENOUS | Status: AC
  Administered 2023-02-03: 150 mg via INTRAVENOUS
  Filled 2023-02-03: qty 150

## 2023-02-03 NOTE — Progress Notes (Signed)
Hematology/Oncology Consult note [facility]  Telephone:([phone] [phone]  Patient Care Team: Babs Sciara, MD as PCP - General (Family Medicine) Doreatha Massed, MD as Medical Oncologist (Medical Oncology) Therese Sarah, RN as Oncology Nurse Navigator (Medical Oncology) Creig Hines, MD as Consulting Physician (Oncology) Benita Gutter, RN as Oncology Nurse Navigator   Name of the patient: Melissa Rodriguez  [id number]  July 23, 1965   Date of visit: 02/03/23  Diagnosis- pathologic prognostic stage Ib invasive mammary carcinoma of the left breast T2 N1 M0 ER/PR positive HER2 positive   Chief complaint/ Reason for visit-on treatment assessment prior to cycle 6 of adjuvant TCHP chemotherapy  Heme/Onc history:  Patient is a 58 year old female with a prior history of lumpectomy in 2008 which showed a 7 mm stage I ER/PR positive HER2 negative left breast cancer.  She underwent lumpectomy and adjuvant radiation therapy but did not go through endocrine therapy.   She self palpated a left breast lump led to a diagnostic mammogram in July 2024.  Diagnostic mammogram showed 1.9 cm irregular hypoechoic mass 1 o'clock position 7 cm from the breast grade 3 ER 99% positive, PR 85% positive and HER2 equivocal by IHC and FISH positive.  Ki-67 35% there were calcifications noted in the right breast as well which were biopsy-proven benign.     She underwent genetic testing and was found to have BRCA2 mutation   Patient was seen by Dr. Ellin Saba and he recommended upfront surgery and consideration for adjuvant chemotherapy.  Patient underwent bilateral mastectomy on 08/15/2022.  No evidence of malignancy in the right breast.  There were 2 distinct masses noted in the left breast with dominant mass measured 2.4 x 2.3 x 1.6 cm grade 3 with negative margins.  There was another 1 cm mass where there was uptake of radiotracer dye with positive posterior lateral  anterior margin.  This was believed to be the sentinel lymph node although on pathology it states that there were no lymph nodes identified likely due to prior lumpectomy. mpT2.  She was staged as T2 N1 triple positive disease.   She was recommended adjuvant TCHP chemotherapy and received cycle 1 on 10/14/2022  Interval history-patient has been getting on and off diarrhea about 1-2 times a day.  She felt it was particularly worse with the last cycle.  She has been taking as needed Imodium.  She is concerned about her ongoing weight loss.  She has lost 6 pounds since the start of treatment in October 2024.  She has not picked up her prescription for Lomotil yet.  ECOG PS- 1 Pain scale- 0   Review of systems- Review of Systems  Constitutional:  Positive for malaise/fatigue. Negative for chills, fever and weight loss.  HENT:  Negative for congestion, ear discharge and nosebleeds.   Eyes:  Negative for blurred vision.  Respiratory:  Negative for cough, hemoptysis, sputum production, shortness of breath and wheezing.   Cardiovascular:  Negative for chest pain, palpitations, orthopnea and claudication.  Gastrointestinal:  Positive for diarrhea. Negative for abdominal pain, blood in stool, constipation, heartburn, melena, nausea and vomiting.  Genitourinary:  Negative for dysuria, flank pain, frequency, hematuria and urgency.  Musculoskeletal:  Negative for back pain, joint pain and myalgias.  Skin:  Negative for rash.  Neurological:  Negative for dizziness, tingling, focal weakness, seizures, weakness and headaches.  Endo/Heme/Allergies:  Does not bruise/bleed easily.  Psychiatric/Behavioral:  Negative for depression and suicidal ideas. The patient does not  have insomnia.       Allergies  Allergen Reactions   Benadryl [Diphenhydramine Hcl]     Jittery and Hallucinations   Diphenhydramine Hcl Anxiety    Other Reaction(s): Hallucination  Jittery and Hallucinations    Made her feel like she  was crawling the walls.   Hydrocodone Shortness Of Breath    Felt like she could not breathe-February 2020   Penicillins Hives and Rash    Did it involve swelling of the face/tongue/throat, SOB, or low BP? No  Did it involve sudden or severe rash/hives, skin peeling, or any reaction on the inside of your mouth or nose? No  Did you need to seek medical attention at a hospital or doctor's office? No  When did it last happen?        If all above answers are "NO", may proceed with cephalosporin use.  Any medicine related to PCN    Did it involve swelling of the face/tongue/throat, SOB, or low BP? No Did it involve sudden or severe rash/hives, skin peeling, or any reaction on the inside of your mouth or nose? No Did you need to seek medical attention at a hospital or doctor's office? No When did it last happen?       If all above answers are "NO", may proceed with cephalosporin use.   Ciprofloxacin Hives   Docetaxel Other (See Comments)    Tingling of lips, teeth hurting, hot tongue   Trastuzumab-Anns [Trastuzumab-Pkrb] Itching    Tingling and hot tongue and lips     Past Medical History:  Diagnosis Date   Adenocarcinoma of breast (HCC) 05/20/2011   Stage I (T1b N0), grade 1, well-differentiated adenocarcinoma of the medial lower quadrant of the left breast. It was a tubular carcinoma, status post lumpectomy on 03/13/2006, ER positive 79%, PR positive 92%, HER2/neu negative, Ki-67 marker low at 18%. Her cancer was 7 mm in size, and she had 4 sentinel nodes all negative along with her lumpectomy. She was treated with radiation therapy postoper   Complication of anesthesia    pt states her bladder did not wake up after one surgery. Went to ER, had a urinary catheter for 1 week   History of kidney stones    Iron deficiency    Iron deficiency anemia 05/20/2011   Secondary to GU bleeding.  Good absorber of PO iron   Personal history of radiation therapy      Past Surgical History:   Procedure Laterality Date   BREAST BIOPSY Left 07/16/2022   [place] LT BREAST BX W LOC DEV 1ST LESION IMG BX SPEC US GUIDE 07/16/2022 Edwin Cap, MD [facility]   BREAST BIOPSY Right 07/26/2022   MM RT BREAST BX W LOC DEV 1ST LESION IMAGE BX SPEC STEREO GUIDE 07/26/2022 [facility]   BREAST LUMPECTOMY Left    EXTRACORPOREAL SHOCK WAVE LITHOTRIPSY Left 02/26/2018   Procedure: EXTRACORPOREAL SHOCK WAVE LITHOTRIPSY (ESWL);  Surgeon: Malen Gauze, MD;  Location: [facility];  Service: Urology;  Laterality: Left;   LUMPECTOM LEFT BREAST  02/2006   LYMPH NODE BIOPSY LEFT  04/2006   MASTECTOMY W/ SENTINEL NODE BIOPSY Left 08/15/2022   Procedure: LEFT SIMPLE MASTECTOMY, LEFT SENTINEL LYMPH NODE MAPPING;  Surgeon: Harriette Bouillon, MD;  Location: [facility];  Service: General;  Laterality: Left;   PORTACATH PLACEMENT N/A 10/09/2022   Procedure: INSERTION PORT-A-CATH;  Surgeon: Harriette Bouillon, MD;  Location: [facility];  Service: General;  Laterality: N/A;   SIMPLE MASTECTOMY  WITH AXILLARY SENTINEL NODE BIOPSY Right 08/15/2022   Procedure: RIGHT RISK REDUCING MASTECTOMY;  Surgeon: Harriette Bouillon, MD;  Location: [facility];  Service: General;  Laterality: Right;    Social History   Socioeconomic History   Marital status: Married    Spouse name: Not on file   Number of children: Not on file   Years of education: Not on file   Highest education level: Some college, no degree  Occupational History   Not on file  Tobacco Use   Smoking status: Never   Smokeless tobacco: Never  Vaping Use   Vaping status: Never Used  Substance and Sexual Activity   Alcohol use: No   Drug use: No   Sexual activity: Yes    Birth control/protection: Post-menopausal  Other Topics Concern   Not on file  Social History Narrative   Not on file   Social Drivers of Health   Financial Resource Strain: Patient Declined (10/16/2022)   Overall Financial Resource Strain  (CARDIA)    Difficulty of Paying Living Expenses: Patient declined  Recent Concern: Physicist, medical Strain - High Risk (10/03/2022)   Overall Financial Resource Strain (CARDIA)    Difficulty of Paying Living Expenses: Hard  Food Insecurity: No Food Insecurity (11/27/2022)   Hunger Vital Sign    Worried About Running Out of Food in the Last Year: Never true    Ran Out of Food in the Last Year: Never true  Transportation Needs: No Transportation Needs (11/27/2022)   PRAPARE - Administrator, Civil Service (Medical): No    Lack of Transportation (Non-Medical): No  Physical Activity: Insufficiently Active (10/16/2022)   Exercise Vital Sign    Days of Exercise per Week: 1 day    Minutes of Exercise per Session: 20 min  Stress: No Stress Concern Present (10/16/2022)   [employer] of Occupational Health - Occupational Stress Questionnaire    Feeling of Stress : Not at all  Social Connections: Unknown (10/16/2022)   Social Connection and Isolation Panel [NHANES]    Frequency of Communication with Friends and Family: Patient declined    Frequency of Social Gatherings with Friends and Family: Patient declined    Attends Religious Services: More than 4 times per year    Active Member of [employer] or Organizations: Yes    Attends Engineer, structural: More than 4 times per year    Marital Status: Married  Catering manager Violence: Not At Risk (11/27/2022)   Humiliation, Afraid, Rape, and Kick questionnaire    Fear of Current or Ex-Partner: No    Emotionally Abused: No    Physically Abused: No    Sexually Abused: No    Family History  Problem Relation Age of Onset   Hypertension Mother    Hypertension Father    Heart disease Father 84       MI in his 64s   Cancer Maternal Aunt        unk type, possibly hip. dx >50   Breast cancer Paternal Aunt        dx 1s   Cancer Paternal Aunt        unknown, d. 50s   Prostate cancer Cousin        dx 74s-50s   Stomach  cancer Cousin        dx 40s-50s     Current Outpatient Medications:    aluminum-magnesium hydroxide 200-200 MG/5ML suspension, Take 15 mLs by mouth every 6 (six) hours as needed  for indigestion. Mix 1:1 with Lidocaine and swish and spit 4 times a day, Disp: 355 mL, Rfl: 2   Bioflavonoid Products (GRAPE SEED PO), Take 1 capsule by mouth daily., Disp: , Rfl:    Calcium Carbonate Antacid (TUMS PO), Take 1 tablet by mouth as needed. Reported on 05/22/2015, Disp: , Rfl:    CARBOPLATIN IV, Inject into the vein every 21 ( twenty-one) days., Disp: , Rfl:    DOCETAXEL IV, Inject into the vein every 21 ( twenty-one) days., Disp: , Rfl:    lidocaine (XYLOCAINE) 2 % solution, Use as directed 15 mLs in the mouth or throat 4 (four) times daily. Mix 1:1 with maalox and swish and spit 4 times a day, Disp: 100 mL, Rfl: 2   lidocaine-prilocaine (EMLA) cream, Apply a quarter-sized amount to port a cath site and cover with plastic wrap 1 hour prior to infusion appointments, Disp: 30 g, Rfl: 3   loratadine (CLARITIN) 5 MG chewable tablet, Chew 5 mg by mouth daily., Disp: , Rfl:    montelukast (SINGULAIR) 10 MG tablet, Take one 10mg  tab for 2 days before chemo (day 1 and day 2), Disp: 20 tablet, Rfl: 0   ondansetron (ZOFRAN) 8 MG tablet, Take 1 tablet (8 mg total) by mouth every 8 (eight) hours as needed for nausea or vomiting., Disp: 20 tablet, Rfl: 0   PERTUZUMAB IV, Inject into the vein every 21 ( twenty-one) days., Disp: , Rfl:    predniSONE (DELTASONE) 20 MG tablet, Take one 20mg  tab for 2 days before chemo (day 1 and day 2), Disp: 20 tablet, Rfl: 0   TRASTUZUMAB IV, Inject into the vein every 21 ( twenty-one) days., Disp: , Rfl:    cephALEXin (KEFLEX) 500 MG capsule, Take 1 capsule (500 mg total) by mouth 4 (four) times daily. (Patient not taking: Reported on 02/03/2023), Disp: 40 capsule, Rfl: 0   Cholecalciferol (VITAMIN D PO), Take 1,000 Units by mouth daily.  (Patient not taking: Reported on 02/03/2023), Disp: ,  Rfl:    diphenoxylate-atropine (LOMOTIL) 2.5-0.025 MG tablet, Take 1 tablet by mouth 4 (four) times daily as needed for diarrhea or loose stools. (Patient not taking: Reported on 02/03/2023), Disp: 60 tablet, Rfl: 0   nitrofurantoin, macrocrystal-monohydrate, (MACROBID) 100 MG capsule, Take 1 capsule (100 mg total) by mouth 2 (two) times daily. (Patient not taking: Reported on 02/03/2023), Disp: 10 capsule, Rfl: 0 No current facility-administered medications for this visit.  Facility-Administered Medications Ordered in Other Visits:    0.9 %  sodium chloride infusion, , Intravenous, Continuous, Creig Hines, MD, Last Rate: 10 mL/hr at 02/03/23 0931, New Bag at 02/03/23 0931   CARBOplatin (PARAPLATIN) 370 mg in sodium chloride 0.9 % 100 mL chemo infusion, 370 mg, Intravenous, Once, Creig Hines, MD   DOCEtaxel (TAXOTERE) 68 mg in sodium chloride 0.9 % 150 mL chemo infusion, 50 mg/m2 (Treatment Plan Recorded), Intravenous, Once, Creig Hines, MD   pertuzumab (PERJETA) 420 mg in sodium chloride 0.9 % 250 mL chemo infusion, 420 mg, Intravenous, Once, Creig Hines, MD   trastuzumab-anns [facility]) 252 mg in sodium chloride 0.9 % 250 mL chemo infusion, 6 mg/kg (Treatment Plan Recorded), Intravenous, Once, Creig Hines, MD, Last Rate: 524 mL/hr at 02/03/23 1045, 252 mg at 02/03/23 1045  Physical exam:  Vitals:   02/03/23 0854  BP: (!) 143/80  Pulse: 91  Resp: 16  Temp: (!) 97.5 F (36.4 C)  TempSrc: Tympanic  Weight: 86 lb (39 kg)  Physical Exam Cardiovascular:     Rate and Rhythm: Normal rate and regular rhythm.     Heart sounds: Normal heart sounds.  Pulmonary:     Effort: Pulmonary effort is normal.     Breath sounds: Normal breath sounds.  Abdominal:     General: Bowel sounds are normal.     Palpations: Abdomen is soft.  Skin:    General: Skin is warm and dry.  Neurological:     Mental Status: She is alert and oriented to person, place, and time.         Latest Ref Rng  & Units 02/03/2023    8:23 AM  CMP  Glucose 70 - 99 mg/dL 99   BUN 6 - 20 mg/dL 18   Creatinine 9.56 - 1.00 mg/dL 2.13   Sodium 086 - 578 mmol/L 136   Potassium 3.5 - 5.1 mmol/L 3.3   Chloride 98 - 111 mmol/L 99   CO2 22 - 32 mmol/L 28   Calcium 8.9 - 10.3 mg/dL 9.4   Total Protein 6.5 - 8.1 g/dL 6.6   Total Bilirubin 0.0 - 1.2 mg/dL 0.4   Alkaline Phos 38 - 126 U/L 70   AST 15 - 41 U/L 14   ALT 0 - 44 U/L 12       Latest Ref Rng & Units 02/03/2023    8:23 AM  CBC  WBC 4.0 - 10.5 K/uL 7.0   Hemoglobin 12.0 - 15.0 g/dL 46.9   Hematocrit 62.9 - 46.0 % 35.1   Platelets 150 - 400 K/uL 167      Assessment and plan- Patient is a 58 y.o. female with history of BRCA2 mutation and second left breast cancer stage Ib MP T2 N0 versus pT1 cN1 M0 ER/PR positive HER2 positive.  She is here for on treatment assessment prior to cycle 6 of adjuvant TCHP chemotherapy  Counts okay to proceed with cycle 6 of adjuvant TCHP chemotherapy today which will be her last chemo treatment.  Following this she will continue with maintenance Herceptin and Perjeta alone to complete 1 year of treatment.  I have asked her to pick up Lomotil prescription for her diarrhea.  If diarrhea continues to be an issue despite stopping chemotherapy I will consider dropping perjeta altogether and continuing with maintenance Herceptin alone.  I will see her back in 4 weeks time instead of 3 weeks on 03/05/2023 for next cycle of Herceptin and Perjeta.  She will also see GYN oncology on the same day to discuss surgical management of the pelvic mass.  I will also refer her to radiation oncology for discussion about postmastectomy radiation.  She has seen Dr. Karoline Caldwell in the past before and was recommended postmastectomy radiation but she would like to get treated here and [place].  Radiation timing will have to be decided based on when she gets her surgery for pelvic mass.  I will discuss endocrine therapy with her in 3 weeks as  well.  Chemo-induced diarrhea: Plan as per above   Visit Diagnosis 1. Malignant neoplasm of upper-outer quadrant of left breast in female, estrogen receptor positive (HCC)   2. Drug-induced diarrhea   3. Encounter for antineoplastic chemotherapy   4. Encounter for monoclonal antibody treatment for malignancy      Dr. Owens Shark, MD, MPH [facility] [phone] 02/03/2023 10:50 AM

## 2023-02-03 NOTE — Progress Notes (Signed)
Pt in for follow up and last chemo treatment today.  Reports good appetite but had multiple loose stools, only 3 days without any since last treatment.  Pt reports last loose stool was on Saturday and she took imodium.  Pt is concerned about having treatment today.

## 2023-02-03 NOTE — Patient Instructions (Addendum)
 [facility] - A DEPT OF [facility]  Discharge Instructions: Thank you for choosing [facility] to provide your oncology and hematology care.  If you have a lab appointment with the Cancer Center, please go directly to the Cancer Center and check in at the registration area.  Wear comfortable clothing and clothing appropriate for easy access to any Portacath or PICC line.   We strive to give you quality time with your provider. You may need to reschedule your appointment if you arrive late (15 or more minutes).  Arriving late affects you and other patients whose appointments are after yours.  Also, if you miss three or more appointments without notifying the office, you may be dismissed from the clinic at the provider's discretion.      For prescription refill requests, have your pharmacy contact our office and allow 72 hours for refills to be completed.    Today you received the following chemotherapy and/or immunotherapy agents Kanjinti, Perjeta, Taxotere & Carboplatin      To help prevent nausea and vomiting after your treatment, we encourage you to take your nausea medication as directed.  BELOW ARE SYMPTOMS THAT SHOULD BE REPORTED IMMEDIATELY: *FEVER GREATER THAN 100.4 F (38 C) OR HIGHER *CHILLS OR SWEATING *NAUSEA AND VOMITING THAT IS NOT CONTROLLED WITH YOUR NAUSEA MEDICATION *UNUSUAL SHORTNESS OF BREATH *UNUSUAL BRUISING OR BLEEDING *URINARY PROBLEMS (pain or burning when urinating, or frequent urination) *BOWEL PROBLEMS (unusual diarrhea, constipation, pain near the anus) TENDERNESS IN MOUTH AND THROAT WITH OR WITHOUT PRESENCE OF ULCERS (sore throat, sores in mouth, or a toothache) UNUSUAL RASH, SWELLING OR PAIN  UNUSUAL VAGINAL DISCHARGE OR ITCHING   Items with * indicate a potential emergency and should be followed up as soon as possible or go to the Emergency Department if any problems should occur.  Please show the  CHEMOTHERAPY ALERT CARD or IMMUNOTHERAPY ALERT CARD at check-in to the Emergency Department and triage nurse.  Should you have questions after your visit or need to cancel or reschedule your appointment, please contact [facility] - A DEPT OF [facility]  [phone] and follow the prompts.  Office hours are 8:00 a.m. to 4:30 p.m. Monday - Friday. Please note that voicemails left after 4:00 p.m. may not be returned until the following business day.  We are closed weekends and major holidays. You have access to a nurse at all times for urgent questions. Please call the main number to the clinic [phone] and follow the prompts.  For any non-urgent questions, you may also contact your provider using MyChart. We now offer e-Visits for anyone 44 and older to request care online for non-urgent symptoms. For details visit [email or web address].   Also download the MyChart app! Go to the app store, search "MyChart", open the app, select [place], and log in with your MyChart username and password.

## 2023-02-05 ENCOUNTER — Inpatient Hospital Stay: Payer: BLUE CROSS/BLUE SHIELD

## 2023-02-05 DIAGNOSIS — Z170 Estrogen receptor positive status [ER+]: Secondary | ICD-10-CM

## 2023-02-05 DIAGNOSIS — Z5112 Encounter for antineoplastic immunotherapy: Secondary | ICD-10-CM | POA: Diagnosis not present

## 2023-02-05 MED ORDER — PEGFILGRASTIM-CBQV 6 MG/0.6ML ~~LOC~~ SOSY
6.0000 mg | PREFILLED_SYRINGE | Freq: Once | SUBCUTANEOUS | Status: AC
  Administered 2023-02-05: 6 mg via SUBCUTANEOUS
  Filled 2023-02-05: qty 0.6

## 2023-02-06 ENCOUNTER — Encounter: Payer: Self-pay | Admitting: Oncology

## 2023-02-06 ENCOUNTER — Inpatient Hospital Stay: Payer: BLUE CROSS/BLUE SHIELD

## 2023-02-06 NOTE — Progress Notes (Signed)
Nutrition Follow-up:  Patient with left breast cancer, triple positive.  Received TCHP chemotherapy.    Spoke with patient via phone for nutrition follow-up.  Patient not feeling well due to chemotherapy on Monday and shot yesterday.  Reports constipation initially after chemo then diarrhea.  Last cycle diarrhea was really bad.  Has not picked up lomotil yet.  Had bowel movement this am.  Reports foods that are working well are chicken and rice, cheese crackers and soft cheese taco.  Does not like oral nutrition supplements milky or juice shakes.      Medications: reviewed  Labs: K 3.3   Anthropometrics:   Weight 86 lb on 1/20  Weight 92 lb 12/9 91 lb 4.8 oz on 11/20  UBW of 97 lb    NUTRITION DIAGNOSIS: Inadequate oral intake ongoing with treatment   INTERVENTION:  Discussed foods to choose with diarrhea Encouraged hydration Encouraged small frequent snacks of foods rich in protein.  Patient will pick up lomotil and know she can take it QID to help control diarrhea    MONITORING, EVALUATION, GOAL: weight trends, intake   NEXT VISIT: Wed, Feb 19 during infusion  Paco Cislo B. Freida Busman, RD, LDN Registered Dietitian [phone]

## 2023-02-07 ENCOUNTER — Other Ambulatory Visit: Payer: Self-pay | Admitting: Hematology

## 2023-02-10 ENCOUNTER — Encounter: Payer: Self-pay | Admitting: Oncology

## 2023-02-24 ENCOUNTER — Ambulatory Visit: Payer: BLUE CROSS/BLUE SHIELD | Admitting: Nurse Practitioner

## 2023-02-24 ENCOUNTER — Ambulatory Visit: Payer: BLUE CROSS/BLUE SHIELD

## 2023-02-27 ENCOUNTER — Encounter: Payer: Self-pay | Admitting: Oncology

## 2023-03-05 ENCOUNTER — Inpatient Hospital Stay: Payer: BLUE CROSS/BLUE SHIELD | Attending: Hematology | Admitting: Obstetrics and Gynecology

## 2023-03-05 ENCOUNTER — Encounter: Payer: Self-pay | Admitting: Oncology

## 2023-03-05 ENCOUNTER — Inpatient Hospital Stay: Payer: BLUE CROSS/BLUE SHIELD

## 2023-03-05 ENCOUNTER — Inpatient Hospital Stay (HOSPITAL_BASED_OUTPATIENT_CLINIC_OR_DEPARTMENT_OTHER): Payer: BLUE CROSS/BLUE SHIELD | Admitting: Oncology

## 2023-03-05 VITALS — BP 131/67 | HR 92 | Temp 98.0°F | Resp 18 | Wt 90.0 lb

## 2023-03-05 VITALS — BP 131/67 | HR 92 | Temp 98.0°F | Resp 18 | Wt 90.8 lb

## 2023-03-05 VITALS — BP 125/61 | HR 87 | Resp 16

## 2023-03-05 DIAGNOSIS — N838 Other noninflammatory disorders of ovary, fallopian tube and broad ligament: Secondary | ICD-10-CM

## 2023-03-05 DIAGNOSIS — R197 Diarrhea, unspecified: Secondary | ICD-10-CM | POA: Insufficient documentation

## 2023-03-05 DIAGNOSIS — Z1502 Genetic susceptibility to malignant neoplasm of ovary: Secondary | ICD-10-CM | POA: Insufficient documentation

## 2023-03-05 DIAGNOSIS — Z1721 Progesterone receptor positive status: Secondary | ICD-10-CM | POA: Diagnosis not present

## 2023-03-05 DIAGNOSIS — Z1501 Genetic susceptibility to malignant neoplasm of breast: Secondary | ICD-10-CM | POA: Diagnosis not present

## 2023-03-05 DIAGNOSIS — C50412 Malignant neoplasm of upper-outer quadrant of left female breast: Secondary | ICD-10-CM

## 2023-03-05 DIAGNOSIS — R109 Unspecified abdominal pain: Secondary | ICD-10-CM | POA: Insufficient documentation

## 2023-03-05 DIAGNOSIS — K521 Toxic gastroenteritis and colitis: Secondary | ICD-10-CM

## 2023-03-05 DIAGNOSIS — C50912 Malignant neoplasm of unspecified site of left female breast: Secondary | ICD-10-CM

## 2023-03-05 DIAGNOSIS — Z5112 Encounter for antineoplastic immunotherapy: Secondary | ICD-10-CM | POA: Diagnosis not present

## 2023-03-05 DIAGNOSIS — R140 Abdominal distension (gaseous): Secondary | ICD-10-CM | POA: Diagnosis not present

## 2023-03-05 DIAGNOSIS — Z5181 Encounter for therapeutic drug level monitoring: Secondary | ICD-10-CM | POA: Diagnosis not present

## 2023-03-05 DIAGNOSIS — E876 Hypokalemia: Secondary | ICD-10-CM | POA: Diagnosis not present

## 2023-03-05 DIAGNOSIS — Z170 Estrogen receptor positive status [ER+]: Secondary | ICD-10-CM | POA: Diagnosis not present

## 2023-03-05 DIAGNOSIS — Z1509 Genetic susceptibility to other malignant neoplasm: Secondary | ICD-10-CM | POA: Diagnosis not present

## 2023-03-05 DIAGNOSIS — M549 Dorsalgia, unspecified: Secondary | ICD-10-CM | POA: Diagnosis not present

## 2023-03-05 DIAGNOSIS — R978 Other abnormal tumor markers: Secondary | ICD-10-CM | POA: Diagnosis not present

## 2023-03-05 DIAGNOSIS — Z9013 Acquired absence of bilateral breasts and nipples: Secondary | ICD-10-CM | POA: Diagnosis not present

## 2023-03-05 DIAGNOSIS — D649 Anemia, unspecified: Secondary | ICD-10-CM | POA: Insufficient documentation

## 2023-03-05 DIAGNOSIS — D3912 Neoplasm of uncertain behavior of left ovary: Secondary | ICD-10-CM | POA: Diagnosis not present

## 2023-03-05 DIAGNOSIS — Z79899 Other long term (current) drug therapy: Secondary | ICD-10-CM

## 2023-03-05 DIAGNOSIS — Z148 Genetic carrier of other disease: Secondary | ICD-10-CM | POA: Insufficient documentation

## 2023-03-05 DIAGNOSIS — Z1731 Human epidermal growth factor receptor 2 positive status: Secondary | ICD-10-CM | POA: Diagnosis not present

## 2023-03-05 MED ORDER — FAMOTIDINE IN NACL 20-0.9 MG/50ML-% IV SOLN
20.0000 mg | Freq: Once | INTRAVENOUS | Status: AC
  Administered 2023-03-05: 20 mg via INTRAVENOUS
  Filled 2023-03-05: qty 50

## 2023-03-05 MED ORDER — ACETAMINOPHEN 325 MG PO TABS
650.0000 mg | ORAL_TABLET | Freq: Once | ORAL | Status: AC
  Administered 2023-03-05: 650 mg via ORAL
  Filled 2023-03-05: qty 2

## 2023-03-05 MED ORDER — CETIRIZINE HCL 10 MG/ML IV SOLN
10.0000 mg | Freq: Once | INTRAVENOUS | Status: AC
  Administered 2023-03-05: 10 mg via INTRAVENOUS
  Filled 2023-03-05: qty 1

## 2023-03-05 MED ORDER — TRASTUZUMAB-ANNS CHEMO 150 MG IV SOLR
6.0000 mg/kg | Freq: Once | INTRAVENOUS | Status: AC
  Administered 2023-03-05: 252 mg via INTRAVENOUS
  Filled 2023-03-05: qty 12

## 2023-03-05 MED ORDER — METHYLPREDNISOLONE SODIUM SUCC 125 MG IJ SOLR
125.0000 mg | Freq: Once | INTRAMUSCULAR | Status: AC
Start: 2023-03-05 — End: 2023-03-05
  Administered 2023-03-05: 125 mg via INTRAVENOUS
  Filled 2023-03-05: qty 2

## 2023-03-05 MED ORDER — HEPARIN SOD (PORK) LOCK FLUSH 100 UNIT/ML IV SOLN
500.0000 [IU] | Freq: Once | INTRAVENOUS | Status: AC | PRN
  Administered 2023-03-05: 500 [IU]
  Filled 2023-03-05: qty 5

## 2023-03-05 MED ORDER — PERTUZUMAB CHEMO INJECTION 420 MG/14ML
420.0000 mg | Freq: Once | INTRAVENOUS | Status: AC
  Administered 2023-03-05: 420 mg via INTRAVENOUS
  Filled 2023-03-05: qty 14

## 2023-03-05 MED ORDER — SODIUM CHLORIDE 0.9 % IV SOLN
INTRAVENOUS | Status: DC
Start: 2023-03-05 — End: 2023-03-05
  Filled 2023-03-05 (×2): qty 250

## 2023-03-05 NOTE — Progress Notes (Signed)
Nutrition Follow-up:  Patient with left breast cancer, triple positive.  Received TCHP chemotherapy.  Starting herceptin and perjeta today.    Met with patient during infusion.  Reports that she can tolerate cheesecake and has been eating a piece before bed each day.  Able to tolerate cheese sticks, cheese tacos, cheese and crackers.  Usually has peanut butter toast and green tea, then cheese crackers, then cheese taco between 6-9:30am.  Able to tolerate chicken and rice, canned peaches, green beans, steak.  Says that diarrhea is better.  Had loose stool this am following normal stool.  Has imodium and lomotil on hand    Medications: lomotil, imodium  Labs: reviewed  Anthropometrics:   Weight 90 lb 86 lb on 1/20 92 lb on 12/9 91 lb 4.8 oz on 11/20  UBW of 97 lb    NUTRITION DIAGNOSIS: Inadequate oral intake improved with treatment    INTERVENTION:  Continue imodium/lomotil to help control diarrhea Continue foods high in calories and protein as tolerated with diarrhea Continue to eat q few hours Wants to continue weight gain prior to surgery Recommend MVI daily with weight loss and planning surgery    MONITORING, EVALUATION, GOAL: weight trends, intake   NEXT VISIT: Tuesday, March 11 during infusion  Jaryiah Mehlman B. Freida Busman, RD, LDN Registered Dietitian [phone]

## 2023-03-05 NOTE — Progress Notes (Signed)
Gynecologic Oncology Consult Visit   Referring Provider: Dr. Smith Robert  Chief Complaint: Adnexal mass in BRCA2 mutation carrier  Subjective:  Melissa Rodriguez is a 58 y.o. female who is seen in consultation from Dr. Smith Robert for pelvic mass, on active chemotherapy for concurrent breast cancer, who returns to clinic for follow up.   She has completed her 6th cycle of neoadjuvant carboplatin-taxotere-herceptin-perjeta chemotherapy and started maintenance herceptin-perjeta today. She presents for discussion of surgery for pelvic mass. She has noticed some intermittent menstrual like cramping on left side where mass is located. Chemotherapy has been complicated by diarrhea.    Inhibin B192.2  Gynecologic Oncology History:  She has history of breast cancer, w/p lumpectomy then recurrence s/p mastectomy, now with new breast cancer and found to be brca2 mutation carrier, currently undergoing adjuvant chemotherapy after surgical excision with Dr. Smith Robert.  She presented to ER on 10/31/22 for pelvic pain and hematuria due to UTI. CT Abdomen Pelvis was performed which revealed heterogeneous mass in left adnexa measuring 8.5 cm. Ovaries not visualized. Endometrium measuring 5-6 mm. Positive for cystitis and ascending UTI. No hydronephrosis.   11/04/22- MRI Pelvis W WO Contrast IMPRESSION: 1. Large, lobulated left ovarian mass measuring 7.9 x 7.0 x 5.9 cm. This is heterogeneously T2 hypointense with irregular regions of internal T2 hyperintensity and poorly enhancing. 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 In retrospective review, this lesion was present on remote  prior CT dated 02/22/2018 and has significantly enlarged in size over the interval, demonstrating relatively indolent behavior. This is however nonetheless technically characterized as an O-RADS category 4 lesion and remains suspicious for solid malignancy. Recommend surgical  referral. 2. Small volume free fluid throughout the pelvis. 3. No evidence of lymphadenopathy or metastatic disease in the pelvis.  Menopause in early 42s, no HRT CA 125 - 11/11/22 13.7     Problem List: Patient Active Problem List   Diagnosis Date Noted   Drug-induced constipation 10/24/2022   Chemotherapy adverse reaction 10/24/2022   Genetic testing 08/07/2022   BRCA2 gene mutation positive 08/07/2022   Breast cancer of upper-outer quadrant of left female breast (HCC) 07/31/2022   Thrombocytopenia (HCC) 05/22/2013   Adenocarcinoma of breast (HCC) 05/20/2011   Iron deficiency anemia 05/20/2011    Past Medical History: Past Medical History:  Diagnosis Date   Adenocarcinoma of breast (HCC) 05/20/2011   Stage I (T1b N0), grade 1, well-differentiated adenocarcinoma of the medial lower quadrant of the left breast. It was a tubular carcinoma, status post lumpectomy on 03/13/2006, ER positive 79%, PR positive 92%, HER2/neu negative, Ki-67 marker low at 18%. Her cancer was 7 mm in size, and she had 4 sentinel nodes all negative along with her lumpectomy. She was treated with radiation therapy postoper   Complication of anesthesia    pt states her bladder did not wake up after one surgery. Went to ER, had a urinary catheter for 1 week   History of kidney stones    Iron deficiency    Iron deficiency anemia 05/20/2011   Secondary to GU bleeding.  Good absorber of PO iron   Personal history of radiation therapy     Past Surgical History: Past Surgical History:  Procedure Laterality Date   BREAST BIOPSY Left 07/16/2022   [place] LT BREAST BX W LOC DEV 1ST LESION IMG BX SPEC US GUIDE 07/16/2022 Edwin Cap, MD [facility]   BREAST BIOPSY Right 07/26/2022   MM RT BREAST BX W  LOC DEV 1ST LESION IMAGE BX SPEC STEREO GUIDE 07/26/2022 [facility]   BREAST LUMPECTOMY Left    EXTRACORPOREAL SHOCK WAVE LITHOTRIPSY Left 02/26/2018   Procedure: EXTRACORPOREAL SHOCK WAVE LITHOTRIPSY (ESWL);   Surgeon: Malen Gauze, MD;  Location: [facility];  Service: Urology;  Laterality: Left;   LUMPECTOM LEFT BREAST  02/2006   LYMPH NODE BIOPSY LEFT  04/2006   MASTECTOMY W/ SENTINEL NODE BIOPSY Left 08/15/2022   Procedure: LEFT SIMPLE MASTECTOMY, LEFT SENTINEL LYMPH NODE MAPPING;  Surgeon: Harriette Bouillon, MD;  Location: [facility];  Service: General;  Laterality: Left;   PORTACATH PLACEMENT N/A 10/09/2022   Procedure: INSERTION PORT-A-CATH;  Surgeon: Harriette Bouillon, MD;  Location: [facility];  Service: General;  Laterality: N/A;   SIMPLE MASTECTOMY WITH AXILLARY SENTINEL NODE BIOPSY Right 08/15/2022   Procedure: RIGHT RISK REDUCING MASTECTOMY;  Surgeon: Harriette Bouillon, MD;  Location: [facility];  Service: General;  Laterality: Right;    OB History:  OB History  Gravida Para Term Preterm AB Living  3 3 3   3   SAB IAB Ectopic Multiple Live Births          # Outcome Date GA Lbr Len/2nd Weight Sex Type Anes PTL Lv  3 Term           2 Term           1 Term             Family History: Family History  Problem Relation Age of Onset   Hypertension Mother    Hypertension Father    Heart disease Father 60       MI in his 30s   Cancer Maternal Aunt        unk type, possibly hip. dx >50   Breast cancer Paternal Aunt        dx 64s   Cancer Paternal Aunt        unknown, d. 36s   Prostate cancer Cousin        dx 68s-50s   Stomach cancer Cousin        dx 80s-50s    Social History: Social History   Socioeconomic History   Marital status: Married    Spouse name: Not on file   Number of children: Not on file   Years of education: Not on file   Highest education level: Some college, no degree  Occupational History   Not on file  Tobacco Use   Smoking status: Never   Smokeless tobacco: Never  Vaping Use   Vaping status: Never Used  Substance and Sexual Activity   Alcohol use: No   Drug use: No   Sexual activity: Yes    Birth  control/protection: Post-menopausal  Other Topics Concern   Not on file  Social History Narrative   Not on file   Social Drivers of Health   Financial Resource Strain: Patient Declined (10/16/2022)   Overall Financial Resource Strain (CARDIA)    Difficulty of Paying Living Expenses: Patient declined  Recent Concern: Physicist, medical Strain - High Risk (10/03/2022)   Overall Financial Resource Strain (CARDIA)    Difficulty of Paying Living Expenses: Hard  Food Insecurity: No Food Insecurity (11/27/2022)   Hunger Vital Sign    Worried About Running Out of Food in the Last Year: Never true    Ran Out of Food in the Last Year: Never true  Transportation Needs: No Transportation Needs (11/27/2022)   PRAPARE - Transportation  Lack of Transportation (Medical): No    Lack of Transportation (Non-Medical): No  Physical Activity: Insufficiently Active (10/16/2022)   Exercise Vital Sign    Days of Exercise per Week: 1 day    Minutes of Exercise per Session: 20 min  Stress: No Stress Concern Present (10/16/2022)   [employer] of Occupational Health - Occupational Stress Questionnaire    Feeling of Stress : Not at all  Social Connections: Unknown (10/16/2022)   Social Connection and Isolation Panel [NHANES]    Frequency of Communication with Friends and Family: Patient declined    Frequency of Social Gatherings with Friends and Family: Patient declined    Attends Religious Services: More than 4 times per year    Active Member of [employer] or Organizations: Yes    Attends Banker Meetings: More than 4 times per year    Marital Status: Married  Catering manager Violence: Not At Risk (11/27/2022)   Humiliation, Afraid, Rape, and Kick questionnaire    Fear of Current or Ex-Partner: No    Emotionally Abused: No    Physically Abused: No    Sexually Abused: No    Allergies: Allergies  Allergen Reactions   Benadryl [Diphenhydramine Hcl]     Jittery and Hallucinations    Diphenhydramine Hcl Anxiety    Other Reaction(s): Hallucination  Jittery and Hallucinations    Made her feel like she was crawling the walls.   Hydrocodone Shortness Of Breath    Felt like she could not breathe-February 2020   Penicillins Hives and Rash    Did it involve swelling of the face/tongue/throat, SOB, or low BP? No  Did it involve sudden or severe rash/hives, skin peeling, or any reaction on the inside of your mouth or nose? No  Did you need to seek medical attention at a hospital or doctor's office? No  When did it last happen?        If all above answers are "NO", may proceed with cephalosporin use.  Any medicine related to PCN    Did it involve swelling of the face/tongue/throat, SOB, or low BP? No Did it involve sudden or severe rash/hives, skin peeling, or any reaction on the inside of your mouth or nose? No Did you need to seek medical attention at a hospital or doctor's office? No When did it last happen?       If all above answers are "NO", may proceed with cephalosporin use.   Ciprofloxacin Hives   Docetaxel Other (See Comments)    Tingling of lips, teeth hurting, hot tongue   Trastuzumab-Anns [Trastuzumab-Pkrb] Itching    Tingling and hot tongue and lips    Current Medications: Current Outpatient Medications  Medication Sig Dispense Refill   aluminum-magnesium hydroxide 200-200 MG/5ML suspension Take 15 mLs by mouth every 6 (six) hours as needed for indigestion. Mix 1:1 with Lidocaine and swish and spit 4 times a day (Patient not taking: Reported on 03/05/2023) 355 mL 2   Bioflavonoid Products (GRAPE SEED PO) Take 1 capsule by mouth daily. (Patient not taking: Reported on 03/05/2023)     Calcium Carbonate Antacid (TUMS PO) Take 1 tablet by mouth as needed. Reported on 05/22/2015     CARBOPLATIN IV Inject into the vein every 21 ( twenty-one) days. (Patient not taking: Reported on 03/05/2023)     cephALEXin (KEFLEX) 500 MG capsule Take 1 capsule (500 mg total) by  mouth 4 (four) times daily. (Patient not taking: Reported on 03/05/2023) 40 capsule 0   Cholecalciferol (  VITAMIN D PO) Take 1,000 Units by mouth daily.     diphenoxylate-atropine (LOMOTIL) 2.5-0.025 MG tablet Take 1 tablet by mouth 4 (four) times daily as needed for diarrhea or loose stools. (Patient not taking: Reported on 03/05/2023) 60 tablet 0   DOCETAXEL IV Inject into the vein every 21 ( twenty-one) days. (Patient not taking: Reported on 03/05/2023)     lidocaine (XYLOCAINE) 2 % solution Use as directed 15 mLs in the mouth or throat 4 (four) times daily. Mix 1:1 with maalox and swish and spit 4 times a day (Patient not taking: Reported on 03/05/2023) 100 mL 2   lidocaine-prilocaine (EMLA) cream Apply a quarter-sized amount to port a cath site and cover with plastic wrap 1 hour prior to infusion appointments (Patient not taking: Reported on 03/05/2023) 30 g 3   loratadine (CLARITIN) 5 MG chewable tablet Chew 5 mg by mouth daily. (Patient not taking: Reported on 03/05/2023)     montelukast (SINGULAIR) 10 MG tablet Take one 10mg  tab for 2 days before chemo (day 1 and day 2) (Patient not taking: Reported on 03/05/2023) 20 tablet 0   nitrofurantoin, macrocrystal-monohydrate, (MACROBID) 100 MG capsule Take 1 capsule (100 mg total) by mouth 2 (two) times daily. (Patient not taking: Reported on 03/05/2023) 10 capsule 0   ondansetron (ZOFRAN) 8 MG tablet Take 1 tablet (8 mg total) by mouth every 8 (eight) hours as needed for nausea or vomiting. (Patient not taking: Reported on 03/05/2023) 20 tablet 0   PERTUZUMAB IV Inject into the vein every 21 ( twenty-one) days. (Patient not taking: Reported on 03/05/2023)     predniSONE (DELTASONE) 20 MG tablet Take one 20mg  tab for 2 days before chemo (day 1 and day 2) (Patient not taking: Reported on 03/05/2023) 20 tablet 0   TRASTUZUMAB IV Inject into the vein every 21 ( twenty-one) days. (Patient not taking: Reported on 03/05/2023)     No current facility-administered  medications for this visit.   Facility-Administered Medications Ordered in Other Visits  Medication Dose Route Frequency Provider Last Rate Last Admin   0.9 %  sodium chloride infusion   Intravenous Continuous Creig Hines, MD 10 mL/hr at 03/05/23 1043 New Bag at 03/05/23 1043   heparin lock flush 100 unit/mL  500 Units Intracatheter Once PRN Creig Hines, MD       pertuzumab (PERJETA) 420 mg in sodium chloride 0.9 % 250 mL chemo infusion  420 mg Intravenous Once Creig Hines, MD       trastuzumab-anns [facility]) 252 mg in sodium chloride 0.9 % 250 mL chemo infusion  6 mg/kg (Treatment Plan Recorded) Intravenous Once Creig Hines, MD 524 mL/hr at 03/05/23 1132 252 mg at 03/05/23 1132    Review of Systems General:  fatigue, weakness Skin: no complaints Eyes: no complaints HEENT: no complaints Breasts: no complaints Pulmonary: no complaints Cardiac: no complaints Gastrointestinal: abdominal pain at times with bloating and gaseous distention, diarhea Genitourinary/Sexual: no complaints Ob/Gyn: vulvar irritation Musculoskeletal: back pain Hematology: no complaints Neurologic/Psych: no complaints   Objective:  Physical Examination:  BP 131/67   Pulse 92   Temp 98 F (36.7 C)   Resp 18   Wt 90 lb (40.8 kg)   LMP 07/05/2012   BMI 15.94 kg/m     ECOG Performance Status: 1 - Symptomatic but completely ambulatory  GENERAL: Patient is a well appearing female in no acute distress HEENT:  Sclera clear. Anicteric NODES:  Negative axillary, supraclavicular, inguinal lymph node survery  LUNGS:  Clear to auscultation bilaterally.   HEART:  Regular rate and rhythm.  ABDOMEN:  Soft, nontender.  No masses or ascites EXTREMITIES:  No peripheral edema. Atraumatic. No cyanosis SKIN:  Clear with no obvious rashes or skin changes.  NEURO:  Nonfocal. Well oriented.  Appropriate affect.   Pelvic: Exam chaperoned by CMA EGBUS: no lesions Cervix: no lesions, nontender, mobile Vagina:  no lesions, no discharge or bleeding Uterus: Unable to palpate due to the mass  adnexa: large hard mass extending from right to left.  Overall size 10 cm at least limited mobility. Rectovaginal: confirmatory.  The mass impinges on the rectum.  There does not seem to be mucosal involvement  Lab Review Labs on site today:   Chemistry      Component Value Date/Time   NA 136 02/03/2023 0823   K 3.3 (L) 02/03/2023 0823   CL 99 02/03/2023 0823   CO2 28 02/03/2023 0823   BUN 18 02/03/2023 0823   CREATININE 0.54 02/03/2023 0823      Component Value Date/Time   CALCIUM 9.4 02/03/2023 0823   ALKPHOS 70 02/03/2023 0823   AST 14 (L) 02/03/2023 0823   ALT 12 02/03/2023 0823   BILITOT 0.4 02/03/2023 0823      Lab Results  Component Value Date   WBC 7.0 02/03/2023   HGB 11.9 (L) 02/03/2023   HCT 35.1 (L) 02/03/2023   MCV 93.6 02/03/2023   PLT 167 02/03/2023   Radiology 11/04/2022  CLINICAL DATA:  Left adnexal mass identified by prior CT   EXAM: MRI PELVIS WITHOUT AND WITH CONTRAST   TECHNIQUE: Multiplanar multisequence MR imaging of the pelvis was performed both before and after administration of intravenous contrast.   CONTRAST:  4mL GADAVIST GADOBUTROL 1 MMOL/ML IV SOLN   COMPARISON:  CT abdomen pelvis, 10/31/2022, CT abdomen pelvis, 02/22/2018   FINDINGS: Urinary Tract:  No abnormality visualized.   Bowel:  Unremarkable visualized pelvic bowel loops.   Vascular/Lymphatic: No pathologically enlarged lymph nodes. No significant vascular abnormality seen.   Reproductive: Normal uterus. Normal postmenopausal appearance of the right ovary. Large, lobulated left ovarian mass measuring 7.9 x 7.0 x 5.9 cm (series 4, image 18). This is heterogeneously T2 hypointense with irregular regions of internal T2 hyperintensity and poorly enhancing.   Other:  Small volume free fluid throughout the pelvis.   Musculoskeletal: No suspicious bone lesions identified.   IMPRESSION: 1.  Large, lobulated left ovarian mass measuring 7.9 x 7.0 x 5.9 cm. This is heterogeneously T2 hypointense with irregular regions of internal T2 hyperintensity and poorly enhancing. 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 In retrospective review, this lesion was present on remote prior CT dated 02/22/2018 and has significantly enlarged in size over the interval, demonstrating relatively indolent behavior. This is however nonetheless technically characterized as an O-RADS category 4 lesion and remains suspicious for solid malignancy. Recommend surgical referral. 2. Small volume free fluid throughout the pelvis. 3. No evidence of lymphadenopathy or metastatic disease in the pelvis.   These results will be called to the ordering clinician or representative by the Radiologist Assistant, and communication documented in the PACS or [employer].      Assessment:  Melissa Rodriguez is a 58 y.o. BRCA2 mutation carrier diagnosed with 8.5 cm solid left adnexal mass that was present in 2020 CT scan and has slowly enlarged to 8.5 cm. Normal CA125.  Radiologist comments that it is consistent with  fibrothecoma and feels very solid and hard on exam. Enlarging mass based on exam today.   Elevated Inhibin B  Hypokalemia, asymptomatic  Anemia, mild  Breast cancer on active treatment.   Plan:   Problem List Items Addressed This Visit       Other   BRCA2 gene mutation positive   Relevant Orders   CT CHEST ABDOMEN PELVIS W CONTRAST   Other Visit Diagnoses       Ovarian mass, left    -  Primary   Relevant Orders   CT CHEST ABDOMEN PELVIS W CONTRAST      I recommended CT C/A/P to assess disease status. Recheck inhibin B level given initial test was elevated and concern for granulosa tumor.  At her next visit we can discuss surgical options.  Plan for surgery at [facility] given the findings on exam and concern for  need to convert to laparotomy.  She is amendable to this plan.  Will try to save an OR date at [facility] for robotic hysterectomy and bilateral salpingo-oophorectomy and staging if needed.  Suggested return to clinic in 2 weeks.   Daughter is 94 and has the BRCA2 mutation.  I offered for her daughter to be seen by GYN oncology for further discussion of screening and canceling regarding risk reducing surgery.  There also some potential trial she may be interested in participating.  Will need to follow-up on her son to see if they have been tested.  I personally had a face to face interaction and evaluated the patient jointly with the NP, Ms. Consuello Masse.  I have reviewed her history and available records and have performed the key portions of the physical exam including lymph node survey, abdominal exam, pelvic exam with my findings confirming those documented above by the APP.  I have discussed the case with the APP and the patient.  I agree with the above documentation, assessment and plan which was fully formulated by me.  Counseling was completed by me.   I personally saw the patient and performed a substantive portion of this encounter in conjunction with the listed APP as documented above.  Raywood Wailes Leta Jungling, MD

## 2023-03-05 NOTE — Patient Instructions (Signed)
[facility] - A DEPT OF [facility]  Discharge Instructions: Thank you for choosing [facility] to provide your oncology and hematology care.  If you have a lab appointment with the Cancer Center, please go directly to the Cancer Center and check in at the registration area.  Wear comfortable clothing and clothing appropriate for easy access to any Portacath or PICC line.   We strive to give you quality time with your provider. You may need to reschedule your appointment if you arrive late (15 or more minutes).  Arriving late affects you and other patients whose appointments are after yours.  Also, if you miss three or more appointments without notifying the office, you may be dismissed from the clinic at the provider's discretion.      For prescription refill requests, have your pharmacy contact our office and allow 72 hours for refills to be completed.    Today you received the following chemotherapy and/or immunotherapy agents Kanjinti, Perjeta   To help prevent nausea and vomiting after your treatment, we encourage you to take your nausea medication as directed.  BELOW ARE SYMPTOMS THAT SHOULD BE REPORTED IMMEDIATELY: *FEVER GREATER THAN 100.4 F (38 C) OR HIGHER *CHILLS OR SWEATING *NAUSEA AND VOMITING THAT IS NOT CONTROLLED WITH YOUR NAUSEA MEDICATION *UNUSUAL SHORTNESS OF BREATH *UNUSUAL BRUISING OR BLEEDING *URINARY PROBLEMS (pain or burning when urinating, or frequent urination) *BOWEL PROBLEMS (unusual diarrhea, constipation, pain near the anus) TENDERNESS IN MOUTH AND THROAT WITH OR WITHOUT PRESENCE OF ULCERS (sore throat, sores in mouth, or a toothache) UNUSUAL RASH, SWELLING OR PAIN  UNUSUAL VAGINAL DISCHARGE OR ITCHING   Items with * indicate a potential emergency and should be followed up as soon as possible or go to the Emergency Department if any problems should occur.  Please show the CHEMOTHERAPY ALERT CARD or  IMMUNOTHERAPY ALERT CARD at check-in to the Emergency Department and triage nurse.  Should you have questions after your visit or need to cancel or reschedule your appointment, please contact [facility] - A DEPT OF [facility]  [phone] and follow the prompts.  Office hours are 8:00 a.m. to 4:30 p.m. Monday - Friday. Please note that voicemails left after 4:00 p.m. may not be returned until the following business day.  We are closed weekends and major holidays. You have access to a nurse at all times for urgent questions. Please call the main number to the clinic [phone] and follow the prompts.  For any non-urgent questions, you may also contact your provider using MyChart. We now offer e-Visits for anyone 40 and older to request care online for non-urgent symptoms. For details visit [email or web address].   Also download the MyChart app! Go to the app store, search "MyChart", open the app, select , and log in with your MyChart username and password.

## 2023-03-05 NOTE — Progress Notes (Signed)
Hematology/Oncology Consult note [facility]  [phone] [phone]  Patient Care Team: Babs Sciara, MD as PCP - General (Family Medicine) Doreatha Massed, MD as Medical Oncologist (Medical Oncology) Therese Sarah, RN as Oncology Nurse Navigator (Medical Oncology) Creig Hines, MD as Consulting Physician (Oncology) Benita Gutter, RN as Oncology Nurse Navigator   Name of the patient: Melissa Rodriguez  [id number]  [birth year]   Date of visit: 03/05/23  Diagnosis- pathologic prognostic stage Ib invasive mammary carcinoma of the left breast T2 N1 M0 ER/PR positive HER2 positive   Chief complaint/ Reason for visit-on treatment assessment prior to cycle 1 of maintenance Herceptin and Perjeta  Heme/Onc history: Patient is a 58 year old female with a prior history of lumpectomy in 2008 which showed a 7 mm stage I ER/PR positive HER2 negative left breast cancer.  She underwent lumpectomy and adjuvant radiation therapy but did not go through endocrine therapy.   She self palpated a left breast lump led to a diagnostic mammogram in July 2024.  Diagnostic mammogram showed 1.9 cm irregular hypoechoic mass 1 o'clock position 7 cm from the breast grade 3 ER 99% positive, PR 85% positive and HER2 equivocal by IHC and FISH positive.  Ki-67 35% there were calcifications noted in the right breast as well which were biopsy-proven benign.     She underwent genetic testing and was found to have BRCA2 mutation   Patient was seen by Dr. Ellin Saba and he recommended upfront surgery and consideration for adjuvant chemotherapy.  Patient underwent bilateral mastectomy on 08/15/2022.  No evidence of malignancy in the right breast.  There were 2 distinct masses noted in the left breast with dominant mass measured 2.4 x 2.3 x 1.6 cm grade 3 with negative margins.  There was another 1 cm mass where there was uptake of radiotracer dye with positive posterior  lateral anterior margin.  This was believed to be the sentinel lymph node although on pathology it states that there were no lymph nodes identified likely due to prior lumpectomy. mpT2.  She was staged as T2 N1 triple positive disease.   She was recommended adjuvant TCHP chemotherapy and received cycle 1 on 10/14/2022  Interval history-patient has 1-2 episodes of diarrhea every day or every other day.  She takes 2 doses of Imodium first thing in the morning when she has diarrhea which usually stops it or she may have 1 loose bowel movement subsequently in the day.  She usually skips a day following that and may have another diarrhea a day later.  She felt better after her treatment was delayed by 1 week this time.  She had regular formed stools in her fourth week of chemotherapy  ECOG PS- 1 Pain scale- 0   Review of systems- Review of Systems  Constitutional:  Positive for malaise/fatigue. Negative for chills, fever and weight loss.  HENT:  Negative for congestion, ear discharge and nosebleeds.   Eyes:  Negative for blurred vision.  Respiratory:  Negative for cough, hemoptysis, sputum production, shortness of breath and wheezing.   Cardiovascular:  Negative for chest pain, palpitations, orthopnea and claudication.  Gastrointestinal:  Positive for diarrhea. Negative for abdominal pain, blood in stool, constipation, heartburn, melena, nausea and vomiting.  Genitourinary:  Negative for dysuria, flank pain, frequency, hematuria and urgency.  Musculoskeletal:  Negative for back pain, joint pain and myalgias.  Skin:  Negative for rash.  Neurological:  Negative for dizziness, tingling, focal weakness, seizures, weakness and headaches.  Endo/Heme/Allergies:  Does not bruise/bleed easily.  Psychiatric/Behavioral:  Negative for depression and suicidal ideas. The patient does not have insomnia.       Allergies  Allergen Reactions   Benadryl [Diphenhydramine Hcl]     Jittery and Hallucinations    Diphenhydramine Hcl Anxiety    Other Reaction(s): Hallucination  Jittery and Hallucinations    Made her feel like she was crawling the walls.   Hydrocodone Shortness Of Breath    Felt like she could not breathe-February 2020   Penicillins Hives and Rash    Did it involve swelling of the face/tongue/throat, SOB, or low BP? No  Did it involve sudden or severe rash/hives, skin peeling, or any reaction on the inside of your mouth or nose? No  Did you need to seek medical attention at a hospital or doctor's office? No  When did it last happen?        If all above answers are "NO", may proceed with cephalosporin use.  Any medicine related to PCN    Did it involve swelling of the face/tongue/throat, SOB, or low BP? No Did it involve sudden or severe rash/hives, skin peeling, or any reaction on the inside of your mouth or nose? No Did you need to seek medical attention at a hospital or doctor's office? No When did it last happen?       If all above answers are "NO", may proceed with cephalosporin use.   Ciprofloxacin Hives   Docetaxel Other (See Comments)    Tingling of lips, teeth hurting, hot tongue   Trastuzumab-Anns [Trastuzumab-Pkrb] Itching    Tingling and hot tongue and lips     Past Medical History:  Diagnosis Date   Adenocarcinoma of breast (HCC) 05/20/2011   Stage I (T1b N0), grade 1, well-differentiated adenocarcinoma of the medial lower quadrant of the left breast. It was a tubular carcinoma, status post lumpectomy on 03/13/2006, ER positive 79%, PR positive 92%, HER2/neu negative, Ki-67 marker low at 18%. Her cancer was 7 mm in size, and she had 4 sentinel nodes all negative along with her lumpectomy. She was treated with radiation therapy postoper   Complication of anesthesia    pt states her bladder did not wake up after one surgery. Went to ER, had a urinary catheter for 1 week   History of kidney stones    Iron deficiency    Iron deficiency anemia 05/20/2011    Secondary to GU bleeding.  Good absorber of PO iron   Personal history of radiation therapy      Past Surgical History:  Procedure Laterality Date   BREAST BIOPSY Left 07/16/2022   [place] LT BREAST BX W LOC DEV 1ST LESION IMG BX SPEC US GUIDE 07/16/2022 Edwin Cap, MD [facility]   BREAST BIOPSY Right 07/26/2022   MM RT BREAST BX W LOC DEV 1ST LESION IMAGE BX SPEC STEREO GUIDE 07/26/2022 [facility]   BREAST LUMPECTOMY Left    EXTRACORPOREAL SHOCK WAVE LITHOTRIPSY Left 02/26/2018   Procedure: EXTRACORPOREAL SHOCK WAVE LITHOTRIPSY (ESWL);  Surgeon: Malen Gauze, MD;  Location: [facility];  Service: Urology;  Laterality: Left;   LUMPECTOM LEFT BREAST  02/2006   LYMPH NODE BIOPSY LEFT  04/2006   MASTECTOMY W/ SENTINEL NODE BIOPSY Left 08/15/2022   Procedure: LEFT SIMPLE MASTECTOMY, LEFT SENTINEL LYMPH NODE MAPPING;  Surgeon: Harriette Bouillon, MD;  Location: [facility];  Service: General;  Laterality: Left;   PORTACATH PLACEMENT N/A 10/09/2022   Procedure: INSERTION PORT-A-CATH;  Surgeon:  Harriette Bouillon, MD;  Location: [facility];  Service: General;  Laterality: N/A;   SIMPLE MASTECTOMY WITH AXILLARY SENTINEL NODE BIOPSY Right 08/15/2022   Procedure: RIGHT RISK REDUCING MASTECTOMY;  Surgeon: Harriette Bouillon, MD;  Location: [facility];  Service: General;  Laterality: Right;    Social History   Socioeconomic History   Marital status: Married    Spouse name: Not on file   Number of children: Not on file   Years of education: Not on file   Highest education level: Some college, no degree  Occupational History   Not on file  Tobacco Use   Smoking status: Never   Smokeless tobacco: Never  Vaping Use   Vaping status: Never Used  Substance and Sexual Activity   Alcohol use: No   Drug use: No   Sexual activity: Yes    Birth control/protection: Post-menopausal  Other Topics Concern   Not on file  Social History Narrative   Not on file    Social Drivers of Health   Financial Resource Strain: Patient Declined (10/16/2022)   Overall Financial Resource Strain (CARDIA)    Difficulty of Paying Living Expenses: Patient declined  Recent Concern: Physicist, medical Strain - High Risk (10/03/2022)   Overall Financial Resource Strain (CARDIA)    Difficulty of Paying Living Expenses: Hard  Food Insecurity: No Food Insecurity (11/27/2022)   Hunger Vital Sign    Worried About Running Out of Food in the Last Year: Never true    Ran Out of Food in the Last Year: Never true  Transportation Needs: No Transportation Needs (11/27/2022)   PRAPARE - Administrator, Civil Service (Medical): No    Lack of Transportation (Non-Medical): No  Physical Activity: Insufficiently Active (10/16/2022)   Exercise Vital Sign    Days of Exercise per Week: 1 day    Minutes of Exercise per Session: 20 min  Stress: No Stress Concern Present (10/16/2022)   [employer] of Occupational Health - Occupational Stress Questionnaire    Feeling of Stress : Not at all  Social Connections: Unknown (10/16/2022)   Social Connection and Isolation Panel [NHANES]    Frequency of Communication with Friends and Family: Patient declined    Frequency of Social Gatherings with Friends and Family: Patient declined    Attends Religious Services: More than 4 times per year    Active Member of [employer] or Organizations: Yes    Attends Engineer, structural: More than 4 times per year    Marital Status: Married  Catering manager Violence: Not At Risk (11/27/2022)   Humiliation, Afraid, Rape, and Kick questionnaire    Fear of Current or Ex-Partner: No    Emotionally Abused: No    Physically Abused: No    Sexually Abused: No    Family History  Problem Relation Age of Onset   Hypertension Mother    Hypertension Father    Heart disease Father 92       MI in his 89s   Cancer Maternal Aunt        unk type, possibly hip. dx >50   Breast cancer Paternal  Aunt        dx 75s   Cancer Paternal Aunt        unknown, d. 10s   Prostate cancer Cousin        dx 86s-50s   Stomach cancer Cousin        dx 40s-50s     Current Outpatient Medications:  aluminum-magnesium hydroxide 200-200 MG/5ML suspension, Take 15 mLs by mouth every 6 (six) hours as needed for indigestion. Mix 1:1 with Lidocaine and swish and spit 4 times a day, Disp: 355 mL, Rfl: 2   Bioflavonoid Products (GRAPE SEED PO), Take 1 capsule by mouth daily., Disp: , Rfl:    Calcium Carbonate Antacid (TUMS PO), Take 1 tablet by mouth as needed. Reported on 05/22/2015, Disp: , Rfl:    CARBOPLATIN IV, Inject into the vein every 21 ( twenty-one) days., Disp: , Rfl:    cephALEXin (KEFLEX) 500 MG capsule, Take 1 capsule (500 mg total) by mouth 4 (four) times daily. (Patient not taking: Reported on 02/03/2023), Disp: 40 capsule, Rfl: 0   Cholecalciferol (VITAMIN D PO), Take 1,000 Units by mouth daily.  (Patient not taking: Reported on 02/03/2023), Disp: , Rfl:    diphenoxylate-atropine (LOMOTIL) 2.5-0.025 MG tablet, Take 1 tablet by mouth 4 (four) times daily as needed for diarrhea or loose stools. (Patient not taking: Reported on 02/03/2023), Disp: 60 tablet, Rfl: 0   DOCETAXEL IV, Inject into the vein every 21 ( twenty-one) days., Disp: , Rfl:    lidocaine (XYLOCAINE) 2 % solution, Use as directed 15 mLs in the mouth or throat 4 (four) times daily. Mix 1:1 with maalox and swish and spit 4 times a day, Disp: 100 mL, Rfl: 2   lidocaine-prilocaine (EMLA) cream, Apply a quarter-sized amount to port a cath site and cover with plastic wrap 1 hour prior to infusion appointments, Disp: 30 g, Rfl: 3   loratadine (CLARITIN) 5 MG chewable tablet, Chew 5 mg by mouth daily., Disp: , Rfl:    montelukast (SINGULAIR) 10 MG tablet, Take one 10mg  tab for 2 days before chemo (day 1 and day 2), Disp: 20 tablet, Rfl: 0   nitrofurantoin, macrocrystal-monohydrate, (MACROBID) 100 MG capsule, Take 1 capsule (100 mg total) by  mouth 2 (two) times daily. (Patient not taking: Reported on 02/03/2023), Disp: 10 capsule, Rfl: 0   ondansetron (ZOFRAN) 8 MG tablet, Take 1 tablet (8 mg total) by mouth every 8 (eight) hours as needed for nausea or vomiting., Disp: 20 tablet, Rfl: 0   PERTUZUMAB IV, Inject into the vein every 21 ( twenty-one) days., Disp: , Rfl:    predniSONE (DELTASONE) 20 MG tablet, Take one 20mg  tab for 2 days before chemo (day 1 and day 2), Disp: 20 tablet, Rfl: 0   TRASTUZUMAB IV, Inject into the vein every 21 ( twenty-one) days., Disp: , Rfl:   Physical exam:  Vitals:   03/05/23 0856  BP: 131/67  Pulse: 92  Resp: 18  Temp: 98 F (36.7 C)  TempSrc: Tympanic  SpO2: 100%  Weight: 90 lb 12.8 oz (41.2 kg)   Physical Exam Constitutional:      Comments: She is thin and cachectic. No acute distress  Cardiovascular:     Rate and Rhythm: Normal rate and regular rhythm.     Heart sounds: Normal heart sounds.  Pulmonary:     Effort: Pulmonary effort is normal.     Breath sounds: Normal breath sounds.  Skin:    General: Skin is warm and dry.  Neurological:     Mental Status: She is alert and oriented to person, place, and time.         Latest Ref Rng & Units 02/03/2023    8:23 AM  CMP  Glucose 70 - 99 mg/dL 99   BUN 6 - 20 mg/dL 18   Creatinine 1.61 - 1.00 mg/dL  0.54   Sodium 135 - 145 mmol/L 136   Potassium 3.5 - 5.1 mmol/L 3.3   Chloride 98 - 111 mmol/L 99   CO2 22 - 32 mmol/L 28   Calcium 8.9 - 10.3 mg/dL 9.4   Total Protein 6.5 - 8.1 g/dL 6.6   Total Bilirubin 0.0 - 1.2 mg/dL 0.4   Alkaline Phos 38 - 126 U/L 70   AST 15 - 41 U/L 14   ALT 0 - 44 U/L 12       Latest Ref Rng & Units 02/03/2023    8:23 AM  CBC  WBC 4.0 - 10.5 K/uL 7.0   Hemoglobin 12.0 - 15.0 g/dL 16.1   Hematocrit 09.6 - 46.0 % 35.1   Platelets 150 - 400 K/uL 167      Assessment and plan- Patient is a 58 y.o. female with history of BRCA2 mutation and second left breast cancer stage Ib MP T2 N0 versus pT1 cN1 M0  ER/PR positive HER2 positive.  She is here for on treatment assessment prior to cycle 1 of maintenance Herceptin and Perjeta.  Drug-induced diarrhea: Continue as needed Imodium and Lomotil.  Diarrhea could be secondary to either Herceptin and Perjeta versus chemotherapy.  She is not getting any further chemotherapy at this time so we will see how her diarrhea does with first maintenance Herceptin and Perjeta.  If she continues to have significant diarrhea with this cycle as well I will consider dropping perjeta altogether and continuing Herceptin alone.  She will be seen by covering provider in 3 weeks and I will see her back in 6 weeks.  Echocardiogram in about 4 weeks.  She has gained 4 pounds as compared to her last cycle of chemotherapy.  Labs will be done every 6 weeks.  She is meeting with GYN oncology to discuss surgical management of pelvic mass especially in the setting of BRCA2 mutation.  We will time her next cycle of Herceptin and Perjeta based on surgical timing.  Patient would also benefit from adjuvant radiation therapy.  She has seen Dr. Karoline Caldwell in the past but wishes to get radiation here at [facility].  I am referring her to radiation oncology at this time.  I will discuss start of endocrine therapy down the line but I would like her to first finish her radiation treatment as well as go for her surgery before initiating that   Visit Diagnosis 1. Encounter for antineoplastic chemotherapy   2. Encounter for monoclonal antibody treatment for malignancy   3. Adenocarcinoma of left breast [facility])      Dr. Owens Shark, MD, MPH [facility] [phone] 03/05/2023 8:53 AM

## 2023-03-06 ENCOUNTER — Telehealth: Payer: Self-pay

## 2023-03-06 ENCOUNTER — Encounter: Payer: Self-pay | Admitting: Oncology

## 2023-03-06 NOTE — Telephone Encounter (Signed)
We have been unable to find a location on Melissa Rodriguez's new insurance list to have her echo performed. She has been asked to call her insurance to inquire where she can have this performed within her network and send a my chart message to Dr. Assunta Gambles team.

## 2023-03-07 ENCOUNTER — Telehealth: Payer: Self-pay

## 2023-03-07 NOTE — Telephone Encounter (Signed)
Faxed echo and CT order to [facility] centralized requested patient to have scans done at [facility] facility

## 2023-03-10 ENCOUNTER — Encounter: Payer: Self-pay | Admitting: Oncology

## 2023-03-11 ENCOUNTER — Telehealth: Payer: Self-pay | Admitting: *Deleted

## 2023-03-11 ENCOUNTER — Telehealth: Payer: Self-pay

## 2023-03-11 NOTE — Telephone Encounter (Signed)
 Raynelle Fanning called and left message that the auth for the ECHO has been done however the imagine for the scan  does not do ECHO and you will need to send it and the address [address] in [place]. The NPI [id number] and tax ID # [id number]. Raynelle Fanning says you can call her if you need anything at [phone].,

## 2023-03-11 NOTE — Telephone Encounter (Signed)
 Signed order for echo and CT CAP faxed to [facility], [phone] with confirmation of receipt. Authorization for Echo has been updated to reflect new location [facility] [address] [place] [address], [place] [phone].

## 2023-03-12 ENCOUNTER — Other Ambulatory Visit: Payer: BLUE CROSS/BLUE SHIELD

## 2023-03-16 ENCOUNTER — Encounter: Payer: Self-pay | Admitting: Internal Medicine

## 2023-03-16 NOTE — Progress Notes (Signed)
 Documentation  Patient called that she was drinking oral contrast. Half way through started having burning in mouth and itching on face. Stomach gurgling.  Denies sob, chest pain, itchy throat  Advised to discontinue drinking more contrast. Use benadryl or Claritin for itching.  She has received IV contrast in the past without any reactions in the past.

## 2023-03-17 ENCOUNTER — Encounter: Payer: Self-pay | Admitting: Nurse Practitioner

## 2023-03-17 ENCOUNTER — Ambulatory Visit: Payer: BLUE CROSS/BLUE SHIELD | Admitting: Oncology

## 2023-03-17 ENCOUNTER — Ambulatory Visit: Payer: BLUE CROSS/BLUE SHIELD

## 2023-03-18 ENCOUNTER — Other Ambulatory Visit: Payer: Self-pay

## 2023-03-18 DIAGNOSIS — C50412 Malignant neoplasm of upper-outer quadrant of left female breast: Secondary | ICD-10-CM

## 2023-03-19 ENCOUNTER — Encounter: Payer: Self-pay | Admitting: Radiation Oncology

## 2023-03-19 ENCOUNTER — Ambulatory Visit
Admission: RE | Admit: 2023-03-19 | Discharge: 2023-03-19 | Disposition: A | Discharge status: Home / Self Care | Payer: BLUE CROSS/BLUE SHIELD | Source: Ambulatory Visit | Attending: Radiation Oncology | Admitting: Radiation Oncology

## 2023-03-19 ENCOUNTER — Other Ambulatory Visit: Payer: Self-pay

## 2023-03-19 ENCOUNTER — Inpatient Hospital Stay: Payer: BLUE CROSS/BLUE SHIELD

## 2023-03-19 ENCOUNTER — Inpatient Hospital Stay
Admission: RE | Admit: 2023-03-19 | Discharge: 2023-03-19 | Disposition: A | Discharge status: Home / Self Care | Payer: Self-pay | Source: Ambulatory Visit | Attending: Obstetrics and Gynecology | Admitting: Obstetrics and Gynecology

## 2023-03-19 ENCOUNTER — Other Ambulatory Visit: Payer: Self-pay | Admitting: Lab

## 2023-03-19 ENCOUNTER — Inpatient Hospital Stay: Payer: BLUE CROSS/BLUE SHIELD | Attending: Hematology | Admitting: Obstetrics and Gynecology

## 2023-03-19 VITALS — BP 120/61 | HR 78 | Temp 98.0°F | Resp 14 | Ht 63.0 in | Wt 92.2 lb

## 2023-03-19 DIAGNOSIS — Z87442 Personal history of urinary calculi: Secondary | ICD-10-CM | POA: Insufficient documentation

## 2023-03-19 DIAGNOSIS — Z1509 Genetic susceptibility to other malignant neoplasm: Secondary | ICD-10-CM | POA: Insufficient documentation

## 2023-03-19 DIAGNOSIS — R1900 Intra-abdominal and pelvic swelling, mass and lump, unspecified site: Secondary | ICD-10-CM

## 2023-03-19 DIAGNOSIS — Z1501 Genetic susceptibility to malignant neoplasm of breast: Secondary | ICD-10-CM

## 2023-03-19 DIAGNOSIS — Z7189 Other specified counseling: Secondary | ICD-10-CM

## 2023-03-19 DIAGNOSIS — R1909 Other intra-abdominal and pelvic swelling, mass and lump: Secondary | ICD-10-CM | POA: Diagnosis not present

## 2023-03-19 DIAGNOSIS — Z148 Genetic carrier of other disease: Secondary | ICD-10-CM | POA: Insufficient documentation

## 2023-03-19 DIAGNOSIS — R180 Malignant ascites: Secondary | ICD-10-CM | POA: Insufficient documentation

## 2023-03-19 DIAGNOSIS — Z1731 Human epidermal growth factor receptor 2 positive status: Secondary | ICD-10-CM | POA: Insufficient documentation

## 2023-03-19 DIAGNOSIS — C50412 Malignant neoplasm of upper-outer quadrant of left female breast: Secondary | ICD-10-CM | POA: Diagnosis present

## 2023-03-19 DIAGNOSIS — Z170 Estrogen receptor positive status [ER+]: Secondary | ICD-10-CM | POA: Insufficient documentation

## 2023-03-19 DIAGNOSIS — N838 Other noninflammatory disorders of ovary, fallopian tube and broad ligament: Secondary | ICD-10-CM

## 2023-03-19 DIAGNOSIS — Z9013 Acquired absence of bilateral breasts and nipples: Secondary | ICD-10-CM | POA: Insufficient documentation

## 2023-03-19 DIAGNOSIS — D509 Iron deficiency anemia, unspecified: Secondary | ICD-10-CM | POA: Insufficient documentation

## 2023-03-19 DIAGNOSIS — D3912 Neoplasm of uncertain behavior of left ovary: Secondary | ICD-10-CM | POA: Insufficient documentation

## 2023-03-19 DIAGNOSIS — Z1721 Progesterone receptor positive status: Secondary | ICD-10-CM | POA: Insufficient documentation

## 2023-03-19 DIAGNOSIS — Z5112 Encounter for antineoplastic immunotherapy: Secondary | ICD-10-CM | POA: Insufficient documentation

## 2023-03-19 DIAGNOSIS — Z1502 Genetic susceptibility to malignant neoplasm of ovary: Secondary | ICD-10-CM | POA: Diagnosis not present

## 2023-03-19 DIAGNOSIS — C50411 Malignant neoplasm of upper-outer quadrant of right female breast: Secondary | ICD-10-CM

## 2023-03-19 DIAGNOSIS — R197 Diarrhea, unspecified: Secondary | ICD-10-CM | POA: Insufficient documentation

## 2023-03-19 DIAGNOSIS — K5900 Constipation, unspecified: Secondary | ICD-10-CM | POA: Insufficient documentation

## 2023-03-19 DIAGNOSIS — R978 Other abnormal tumor markers: Secondary | ICD-10-CM | POA: Insufficient documentation

## 2023-03-19 DIAGNOSIS — R102 Pelvic and perineal pain: Secondary | ICD-10-CM | POA: Insufficient documentation

## 2023-03-19 LAB — CBC WITH DIFFERENTIAL (CANCER CENTER ONLY)
Abs Immature Granulocytes: 0.02 10*3/uL (ref 0.00–0.07)
Basophils Absolute: 0 10*3/uL (ref 0.0–0.1)
Basophils Relative: 0 %
Eosinophils Absolute: 0.1 10*3/uL (ref 0.0–0.5)
Eosinophils Relative: 1 %
HCT: 36.7 % (ref 36.0–46.0)
Hemoglobin: 12.3 g/dL (ref 12.0–15.0)
Immature Granulocytes: 0 %
Lymphocytes Relative: 19 %
Lymphs Abs: 1.1 10*3/uL (ref 0.7–4.0)
MCH: 31.5 pg (ref 26.0–34.0)
MCHC: 33.5 g/dL (ref 30.0–36.0)
MCV: 94.1 fL (ref 80.0–100.0)
Monocytes Absolute: 0.3 10*3/uL (ref 0.1–1.0)
Monocytes Relative: 6 %
Neutro Abs: 4 10*3/uL (ref 1.7–7.7)
Neutrophils Relative %: 74 %
Platelet Count: 162 10*3/uL (ref 150–400)
RBC: 3.9 MIL/uL (ref 3.87–5.11)
RDW: 13.2 % (ref 11.5–15.5)
WBC Count: 5.4 10*3/uL (ref 4.0–10.5)
nRBC: 0 % (ref 0.0–0.2)

## 2023-03-19 LAB — CMP (CANCER CENTER ONLY)
ALT: 15 U/L (ref 0–44)
AST: 19 U/L (ref 15–41)
Albumin: 4.3 g/dL (ref 3.5–5.0)
Alkaline Phosphatase: 54 U/L (ref 38–126)
Anion gap: 8 (ref 5–15)
BUN: 17 mg/dL (ref 6–20)
CO2: 26 mmol/L (ref 22–32)
Calcium: 8.9 mg/dL (ref 8.9–10.3)
Chloride: 100 mmol/L (ref 98–111)
Creatinine: 0.55 mg/dL (ref 0.44–1.00)
GFR, Estimated: >60 mL/min (ref >60)
Glucose, Bld: 101 mg/dL — ABNORMAL HIGH (ref 70–99)
Potassium: 3.7 mmol/L (ref 3.5–5.1)
Sodium: 134 mmol/L — ABNORMAL LOW (ref 135–145)
Total Bilirubin: 0.7 mg/dL (ref 0.0–1.2)
Total Protein: 6.4 g/dL — ABNORMAL LOW (ref 6.5–8.1)

## 2023-03-19 NOTE — Progress Notes (Signed)
 Gynecologic Oncology Consult Visit   Referring Provider: Dr. Smith Robert  Chief Complaint: Adnexal mass in BRCA2 mutation carrier  Subjective:  Melissa Rodriguez is a 58 y.o. female who is seen in consultation from Dr. Smith Robert for pelvic mass, on active chemotherapy for concurrent breast cancer, who returns to clinic for follow up.   She has completed her 6th cycle of neoadjuvant carboplatin-taxotere-herceptin-perjeta chemotherapy and started maintenance herceptin-perjeta 03/05/2023.   12/02/2022 Inhibin B192.2  Inhibin B, CA125, CBC, and CMP drawn today.   CT scan demonstrated 8.8 cm left-sided complex mass and small amount of pelvic fluid as well as concerning findings for omental carcinomatosis.  She still has persistent diarrhea and constipation but that has improved.  She also has pelvic pain and like a menstrual cramp that occurs occasionally.  She is very interested in proceeding with surgery.  Dr. Aggie Cosier also spoke to her about axillary node radiation for adjuvant therapy and she is scheduled for her next cycle of maintenance herceptin-perjeta on 03/25/2023.     03/17/23- CT C/A/P    Gynecologic Oncology History:  She has history of breast cancer, w/p lumpectomy then recurrence s/p mastectomy, now with new breast cancer and found to be brca2 mutation carrier, currently undergoing adjuvant chemotherapy after surgical excision with Dr. Smith Robert.  She presented to ER on 10/31/22 for pelvic pain and hematuria due to UTI. CT Abdomen Pelvis was performed which revealed heterogeneous mass in left adnexa measuring 8.5 cm. Ovaries not visualized. Endometrium measuring 5-6 mm. Positive for cystitis and ascending UTI. No hydronephrosis.   11/04/22- MRI Pelvis W WO Contrast IMPRESSION: 1. Large, lobulated left ovarian mass measuring 7.9 x 7.0 x 5.9 cm. This is heterogeneously T2 hypointense with irregular regions of internal T2 hyperintensity and poorly enhancing. Findings are consistent with a primary ovarian  neoplasm, appearance generally suggestive of a fibrothecoma spectrum lesion. In retrospective review, this lesion was present on remote  prior CT dated 02/22/2018 and has significantly enlarged in size over the interval, demonstrating relatively indolent behavior. This is however nonetheless technically characterized as an O-RADS category 4 lesion and remains suspicious for solid malignancy. Recommend surgical referral. 2. Small volume free fluid throughout the pelvis. 3. No evidence of lymphadenopathy or metastatic disease in the pelvis.  Menopause in early 55s, no HRT CA 125 - 11/11/22 13.7     Problem List: Patient Active Problem List   Diagnosis Date Noted   Drug-induced constipation 10/24/2022   Chemotherapy adverse reaction 10/24/2022   Genetic testing 08/07/2022   BRCA2 gene mutation positive 08/07/2022   Breast cancer of upper-outer quadrant of left female breast (HCC) 07/31/2022   Thrombocytopenia (HCC) 05/22/2013   Adenocarcinoma of breast (HCC) 05/20/2011   Iron deficiency anemia 05/20/2011    Past Medical History: Past Medical History:  Diagnosis Date   Adenocarcinoma of breast (HCC) 05/20/2011   Stage I (T1b N0), grade 1, well-differentiated adenocarcinoma of the medial lower quadrant of the left breast. It was a tubular carcinoma, status post lumpectomy on 03/13/2006, ER positive 79%, PR positive 92%, HER2/neu negative, Ki-67 marker low at 18%. Her cancer was 7 mm in size, and she had 4 sentinel nodes all negative along with her lumpectomy. She was treated with radiation therapy postoper   Complication of anesthesia    pt states her bladder did not wake up after one surgery. Went to ER, had a urinary catheter for 1 week   History of kidney stones    Iron deficiency    Iron deficiency anemia  05/20/2011   Secondary to GU bleeding.  Good absorber of PO iron   Personal history of radiation therapy     Past Surgical History: Past Surgical History:  Procedure Laterality  Date   BREAST BIOPSY Left 07/16/2022   [place] LT BREAST BX W LOC DEV 1ST LESION IMG BX SPEC US GUIDE 07/16/2022 Edwin Cap, MD [facility]   BREAST BIOPSY Right 07/26/2022   MM RT BREAST BX W LOC DEV 1ST LESION IMAGE BX SPEC STEREO GUIDE 07/26/2022 [facility]   BREAST LUMPECTOMY Left    EXTRACORPOREAL SHOCK WAVE LITHOTRIPSY Left 02/26/2018   Procedure: EXTRACORPOREAL SHOCK WAVE LITHOTRIPSY (ESWL);  Surgeon: Malen Gauze, MD;  Location: [facility];  Service: Urology;  Laterality: Left;   LUMPECTOM LEFT BREAST  02/2006   LYMPH NODE BIOPSY LEFT  04/2006   MASTECTOMY W/ SENTINEL NODE BIOPSY Left 08/15/2022   Procedure: LEFT SIMPLE MASTECTOMY, LEFT SENTINEL LYMPH NODE MAPPING;  Surgeon: Harriette Bouillon, MD;  Location: [facility];  Service: General;  Laterality: Left;   PORTACATH PLACEMENT N/A 10/09/2022   Procedure: INSERTION PORT-A-CATH;  Surgeon: Harriette Bouillon, MD;  Location: [facility];  Service: General;  Laterality: N/A;   SIMPLE MASTECTOMY WITH AXILLARY SENTINEL NODE BIOPSY Right 08/15/2022   Procedure: RIGHT RISK REDUCING MASTECTOMY;  Surgeon: Harriette Bouillon, MD;  Location: [facility];  Service: General;  Laterality: Right;    OB History:  OB History  Gravida Para Term Preterm AB Living  3 3 3   3   SAB IAB Ectopic Multiple Live Births          # Outcome Date GA Lbr Len/2nd Weight Sex Type Anes PTL Lv  3 Term           2 Term           1 Term             Family History: Family History  Problem Relation Age of Onset   Hypertension Mother    Hypertension Father    Heart disease Father 58       MI in his 25s   Cancer Maternal Aunt        unk type, possibly hip. dx >50   Breast cancer Paternal Aunt        dx 28s   Cancer Paternal Aunt        unknown, d. 58s   Prostate cancer Cousin        dx 72s-50s   Stomach cancer Cousin        dx 78s-50s    Social History: Social History   Socioeconomic History   Marital status:  Married    Spouse name: Not on file   Number of children: Not on file   Years of education: Not on file   Highest education level: Some college, no degree  Occupational History   Not on file  Tobacco Use   Smoking status: Never   Smokeless tobacco: Never  Vaping Use   Vaping status: Never Used  Substance and Sexual Activity   Alcohol use: No   Drug use: No   Sexual activity: Yes    Birth control/protection: Post-menopausal  Other Topics Concern   Not on file  Social History Narrative   Not on file   Social Drivers of Health   Financial Resource Strain: Patient Declined (10/16/2022)   Overall Financial Resource Strain (CARDIA)    Difficulty of Paying Living Expenses: Patient declined  Recent Concern: Financial  Resource Strain - High Risk (10/03/2022)   Overall Financial Resource Strain (CARDIA)    Difficulty of Paying Living Expenses: Hard  Food Insecurity: No Food Insecurity (11/27/2022)   Hunger Vital Sign    Worried About Running Out of Food in the Last Year: Never true    Ran Out of Food in the Last Year: Never true  Transportation Needs: No Transportation Needs (11/27/2022)   PRAPARE - Administrator, Civil Service (Medical): No    Lack of Transportation (Non-Medical): No  Physical Activity: Insufficiently Active (10/16/2022)   Exercise Vital Sign    Days of Exercise per Week: 1 day    Minutes of Exercise per Session: 20 min  Stress: No Stress Concern Present (10/16/2022)   [employer] of Occupational Health - Occupational Stress Questionnaire    Feeling of Stress : Not at all  Social Connections: Unknown (10/16/2022)   Social Connection and Isolation Panel [NHANES]    Frequency of Communication with Friends and Family: Patient declined    Frequency of Social Gatherings with Friends and Family: Patient declined    Attends Religious Services: More than 4 times per year    Active Member of [employer] or Organizations: Yes    Attends Banker  Meetings: More than 4 times per year    Marital Status: Married  Catering manager Violence: Not At Risk (11/27/2022)   Humiliation, Afraid, Rape, and Kick questionnaire    Fear of Current or Ex-Partner: No    Emotionally Abused: No    Physically Abused: No    Sexually Abused: No    Allergies: Allergies  Allergen Reactions   Benadryl [Diphenhydramine Hcl]     Jittery and Hallucinations   Diphenhydramine Hcl Anxiety    Other Reaction(s): Hallucination  Jittery and Hallucinations    Made her feel like she was crawling the walls.   Hydrocodone Shortness Of Breath    Felt like she could not breathe-February 2020   Penicillins Hives and Rash    Did it involve swelling of the face/tongue/throat, SOB, or low BP? No  Did it involve sudden or severe rash/hives, skin peeling, or any reaction on the inside of your mouth or nose? No  Did you need to seek medical attention at a hospital or doctor's office? No  When did it last happen?        If all above answers are "NO", may proceed with cephalosporin use.  Any medicine related to PCN    Did it involve swelling of the face/tongue/throat, SOB, or low BP? No Did it involve sudden or severe rash/hives, skin peeling, or any reaction on the inside of your mouth or nose? No Did you need to seek medical attention at a hospital or doctor's office? No When did it last happen?       If all above answers are "NO", may proceed with cephalosporin use.   Ciprofloxacin Hives   Docetaxel Other (See Comments)    Tingling of lips, teeth hurting, hot tongue   Trastuzumab-Anns [Trastuzumab-Pkrb] Itching    Tingling and hot tongue and lips    Current Medications: Current Outpatient Medications  Medication Sig Dispense Refill   aluminum-magnesium hydroxide 200-200 MG/5ML suspension Take 15 mLs by mouth every 6 (six) hours as needed for indigestion. Mix 1:1 with Lidocaine and swish and spit 4 times a day (Patient not taking: Reported on 03/05/2023) 355 mL  2   Bioflavonoid Products (GRAPE SEED PO) Take 1 capsule by mouth daily. (Patient not  taking: Reported on 03/05/2023)     Calcium Carbonate Antacid (TUMS PO) Take 1 tablet by mouth as needed. Reported on 05/22/2015     CARBOPLATIN IV Inject into the vein every 21 ( twenty-one) days. (Patient not taking: Reported on 03/05/2023)     cephALEXin (KEFLEX) 500 MG capsule Take 1 capsule (500 mg total) by mouth 4 (four) times daily. (Patient not taking: Reported on 03/05/2023) 40 capsule 0   Cholecalciferol (VITAMIN D PO) Take 1,000 Units by mouth daily.     diphenoxylate-atropine (LOMOTIL) 2.5-0.025 MG tablet Take 1 tablet by mouth 4 (four) times daily as needed for diarrhea or loose stools. (Patient not taking: Reported on 03/05/2023) 60 tablet 0   DOCETAXEL IV Inject into the vein every 21 ( twenty-one) days. (Patient not taking: Reported on 03/05/2023)     lidocaine (XYLOCAINE) 2 % solution Use as directed 15 mLs in the mouth or throat 4 (four) times daily. Mix 1:1 with maalox and swish and spit 4 times a day (Patient not taking: Reported on 03/05/2023) 100 mL 2   lidocaine-prilocaine (EMLA) cream Apply a quarter-sized amount to port a cath site and cover with plastic wrap 1 hour prior to infusion appointments (Patient not taking: Reported on 03/05/2023) 30 g 3   loratadine (CLARITIN) 5 MG chewable tablet Chew 5 mg by mouth daily. (Patient not taking: Reported on 03/05/2023)     montelukast (SINGULAIR) 10 MG tablet Take one 10mg  tab for 2 days before chemo (day 1 and day 2) (Patient not taking: Reported on 03/05/2023) 20 tablet 0   nitrofurantoin, macrocrystal-monohydrate, (MACROBID) 100 MG capsule Take 1 capsule (100 mg total) by mouth 2 (two) times daily. (Patient not taking: Reported on 03/05/2023) 10 capsule 0   ondansetron (ZOFRAN) 8 MG tablet Take 1 tablet (8 mg total) by mouth every 8 (eight) hours as needed for nausea or vomiting. (Patient not taking: Reported on 03/05/2023) 20 tablet 0   PERTUZUMAB IV Inject  into the vein every 21 ( twenty-one) days. (Patient not taking: Reported on 03/05/2023)     predniSONE (DELTASONE) 20 MG tablet Take one 20mg  tab for 2 days before chemo (day 1 and day 2) (Patient not taking: Reported on 03/05/2023) 20 tablet 0   TRASTUZUMAB IV Inject into the vein every 21 ( twenty-one) days. (Patient not taking: Reported on 03/05/2023)     No current facility-administered medications for this visit.    Review of Systems As per interval history.   Objective:  Physical Examination:  BP 120/61   Pulse 78   Temp 98 F (36.7 C)   Resp 14   Ht 5\' 3"  (1.6 m)   Wt 92 lb 3.2 oz (41.8 kg)   LMP 07/05/2012   BMI 16.33 kg/m     ECOG Performance Status: 1 - Symptomatic but completely ambulatory   Exam deferred from 03/05/2023 GENERAL: Patient is a well appearing female in no acute distress HEENT:  Sclera clear. Anicteric NODES:  Negative axillary, supraclavicular, inguinal lymph node survery LUNGS:  Clear to auscultation bilaterally.   HEART:  Regular rate and rhythm.  ABDOMEN:  Soft, nontender.  No masses or ascites EXTREMITIES:  No peripheral edema. Atraumatic. No cyanosis SKIN:  Clear with no obvious rashes or skin changes.  NEURO:  Nonfocal. Well oriented.  Appropriate affect.   Pelvic: Exam chaperoned by CMA EGBUS: no lesions Cervix: no lesions, nontender, mobile Vagina: no lesions, no discharge or bleeding Uterus: Unable to palpate due to the mass  adnexa:  large hard mass extending from right to left.  Overall size 10 cm at least limited mobility. Rectovaginal: confirmatory.  The mass impinges on the rectum.  There does not seem to be mucosal involvement  Lab Review Labs on site today:   Chemistry      Component Value Date/Time   NA 134 (L) 03/19/2023 1029   K 3.7 03/19/2023 1029   CL 100 03/19/2023 1029   CO2 26 03/19/2023 1029   BUN 17 03/19/2023 1029   CREATININE 0.55 03/19/2023 1029      Component Value Date/Time   CALCIUM 8.9 03/19/2023 1029    ALKPHOS 54 03/19/2023 1029   AST 19 03/19/2023 1029   ALT 15 03/19/2023 1029   BILITOT 0.7 03/19/2023 1029      Lab Results  Component Value Date   WBC 5.4 03/19/2023   HGB 12.3 03/19/2023   HCT 36.7 03/19/2023   MCV 94.1 03/19/2023   PLT 162 03/19/2023   Radiology 11/04/2022  CLINICAL DATA:  Left adnexal mass identified by prior CT   EXAM: MRI PELVIS WITHOUT AND WITH CONTRAST   TECHNIQUE: Multiplanar multisequence MR imaging of the pelvis was performed both before and after administration of intravenous contrast.   CONTRAST:  4mL GADAVIST GADOBUTROL 1 MMOL/ML IV SOLN   COMPARISON:  CT abdomen pelvis, 10/31/2022, CT abdomen pelvis, 02/22/2018   FINDINGS: Urinary Tract:  No abnormality visualized.   Bowel:  Unremarkable visualized pelvic bowel loops.   Vascular/Lymphatic: No pathologically enlarged lymph nodes. No significant vascular abnormality seen.   Reproductive: Normal uterus. Normal postmenopausal appearance of the right ovary. Large, lobulated left ovarian mass measuring 7.9 x 7.0 x 5.9 cm (series 4, image 18). This is heterogeneously T2 hypointense with irregular regions of internal T2 hyperintensity and poorly enhancing.   Other:  Small volume free fluid throughout the pelvis.   Musculoskeletal: No suspicious bone lesions identified.   IMPRESSION: 1. Large, lobulated left ovarian mass measuring 7.9 x 7.0 x 5.9 cm. This is heterogeneously T2 hypointense with irregular regions of internal T2 hyperintensity and poorly enhancing. 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 In retrospective review, this lesion was present on remote prior CT dated 02/22/2018 and has significantly enlarged in size over the interval, demonstrating relatively indolent behavior. This is however nonetheless technically characterized as an O-RADS category 4 lesion and remains suspicious for solid  malignancy. Recommend surgical referral. 2. Small volume free fluid throughout the pelvis. 3. No evidence of lymphadenopathy or metastatic disease in the pelvis.   These results will be called to the ordering clinician or representative by the Radiologist Assistant, and communication documented in the PACS or [employer].      Assessment:  Melissa Rodriguez is a 58 y.o. BRCA2 mutation carrier diagnosed with 8.5 cm solid left adnexal mass that was present in 2020 CT scan and has slowly enlarged to 8.5 cm. Normal CA125.  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 Enlarging mass based on exam with CT concerning for pelvic fluid and omental disease.   Elevated Inhibin B, repeat pending  Hypokalemia, resolved  Anemia, resolved  Breast cancer on active treatment.   Plan:   Problem List Items Addressed This Visit       Other   BRCA2 gene mutation positive   Breast cancer of upper-outer quadrant of left female breast (HCC) - Primary   Relevant Orders  CA 125   Other Visit Diagnoses       Ovarian mass         Counseling and coordination of care            Plan for surgery at [facility] given the findings on exam and concern for need to convert to laparotomy.  She is amendable to this plan.  April 10th OR date at [facility] for robotic hysterectomy and bilateral salpingo-oophorectomy and staging if needed. Even if frozen section is negative we will obtain omental biopsy given the imaging findings.  I also recommend ureteral stent placement in the OR prior to surgery.  I spoke to Dr. Aggie Cosier and to Dr. Smith Robert.  Plan to hold radiation therapy for breast cancer treatment until after her surgery.  Dr. Smith Robert will proceed with her next cycle of maintenance therapy but will hold maintenance during April.  I also recommended to the patient continue to increase her protein supplementation.  It is really challenging for her as she is lactose  intolerant and also has challenging symptoms with drinking juice.  But she will try adding the protein powder to her smoothies.  Recheck inhibin B level given initial test was elevated and concern for granulosa tumor.    CA125 pending.   Suggested return to clinic in 2-3 weeks postop.   Daughter is 43 and has the BRCA2 mutation.  We previously offered for her daughter to be seen by GYN oncology for further discussion of screening and canceling regarding risk reducing surgery.  There also some potential trial she may be interested in participating.  Will need to follow-up on her son to see if they have been tested.  I personally had a face to face interaction and evaluated the patient.  I have reviewed her history and available records and coordinated her care.  Counseling was completed by me.   Billing based on time of care and coordination of therapy with over 40 minutes for review of images, labs, care coordination with three physicians, and surgical planning.    Niyanna Asch Leta Jungling, MD

## 2023-03-19 NOTE — Progress Notes (Signed)
 NEW PATIENT EVALUATION  Name: Melissa Rodriguez  MRN: [id number]  Date:   03/19/2023     DOB: 1965-01-31   This 58 y.o. female patient presents to the clinic for initial evaluation of pathologic stage T2 NX M0 triple positive invasive mammary carcinoma of the left breast and patient previously undergoing radiation therapy for breast cancer.  REFERRING PHYSICIAN: Creig Hines, MD  CHIEF COMPLAINT:  Chief Complaint  Patient presents with   Consult    DIAGNOSIS: The encounter diagnosis was Malignant neoplasm of upper-outer quadrant of left breast in female, estrogen receptor positive (HCC).   PREVIOUS INVESTIGATIONS:  Pathology reports reviewed Clinical notes reviewed CT scans mammograms    HPI: Patient is an interesting 58 year old female with history of breast cancer in 2008 of the left breast showing a 7 mm stage I ER/PR positive HER2 negative left breast cancer.  She underwent lumpectomy and adjuvant radiation therapy but declined endocrine therapy.  She recently had a self palpated mass in the left breast diagnostic mammogram showed a 2 cm irregular hypoechoic mass 1 o'clock position 7 cm from the nipple.  Tumor was triple positive being ER/PR and HER2/neu overexpressed.  She was found on genetic testing to be BRCA2 mutation positive.  Patient underwent bilateral mastectomy right breast was free of malignancy.  There are 2 masses in the left breast largest measuring 2.4 x 2.3 x 1.6 cm with negative margins.  There was another 1 cm mass that was thought to be sentinel node although on pathology there was no lymph nodes identified.  She underwent TCHP chemotherapy and has completed those treatments.  She is now being treated with Herceptin and Perjeta.  She has been also noted to have a large lobulated left ovarian mass measuring 8 x 7 x 5.9 cm consistent with primary ovarian neoplasm.  She is seen by [facility] today and is planned for early April to have ovarian cancer surgery.  She does have  possibility of carcinomatosis.  She is doing well specifically denies any chest wall tenderness cough or bone pain.  PLANNED TREATMENT REGIMEN: Left chest wall and peripheral lymphatic radiation  PAST MEDICAL HISTORY:  has a past medical history of Adenocarcinoma of breast (HCC) (05/20/2011), Complication of anesthesia, History of kidney stones, Iron deficiency, Iron deficiency anemia (05/20/2011), and Personal history of radiation therapy.    PAST SURGICAL HISTORY:  Past Surgical History:  Procedure Laterality Date   BREAST BIOPSY Left 07/16/2022   [place] LT BREAST BX W LOC DEV 1ST LESION IMG BX SPEC US GUIDE 07/16/2022 Edwin Cap, MD [facility]   BREAST BIOPSY Right 07/26/2022   MM RT BREAST BX W LOC DEV 1ST LESION IMAGE BX SPEC STEREO GUIDE 07/26/2022 [facility]   BREAST LUMPECTOMY Left    EXTRACORPOREAL SHOCK WAVE LITHOTRIPSY Left 02/26/2018   Procedure: EXTRACORPOREAL SHOCK WAVE LITHOTRIPSY (ESWL);  Surgeon: Malen Gauze, MD;  Location: [facility];  Service: Urology;  Laterality: Left;   LUMPECTOM LEFT BREAST  02/2006   LYMPH NODE BIOPSY LEFT  04/2006   MASTECTOMY W/ SENTINEL NODE BIOPSY Left 08/15/2022   Procedure: LEFT SIMPLE MASTECTOMY, LEFT SENTINEL LYMPH NODE MAPPING;  Surgeon: Harriette Bouillon, MD;  Location: [facility];  Service: General;  Laterality: Left;   PORTACATH PLACEMENT N/A 10/09/2022   Procedure: INSERTION PORT-A-CATH;  Surgeon: Harriette Bouillon, MD;  Location: [facility];  Service: General;  Laterality: N/A;   SIMPLE MASTECTOMY WITH AXILLARY SENTINEL NODE BIOPSY Right 08/15/2022   Procedure: RIGHT RISK  REDUCING MASTECTOMY;  Surgeon: Harriette Bouillon, MD;  Location: [facility];  Service: General;  Laterality: Right;    FAMILY HISTORY: family history includes Breast cancer in her paternal aunt; Cancer in her maternal aunt and paternal aunt; Heart disease (age of onset: 80) in her father; Hypertension in her father and mother;  Prostate cancer in her cousin; Stomach cancer in her cousin.  SOCIAL HISTORY:  reports that she has never smoked. She has never used smokeless tobacco. She reports that she does not drink alcohol and does not use drugs.  ALLERGIES: Benadryl [diphenhydramine hcl], Diphenhydramine hcl, Hydrocodone, Penicillins, Ciprofloxacin, Docetaxel, and Trastuzumab-anns [trastuzumab-pkrb]  MEDICATIONS:  Current Outpatient Medications  Medication Sig Dispense Refill   aluminum-magnesium hydroxide 200-200 MG/5ML suspension Take 15 mLs by mouth every 6 (six) hours as needed for indigestion. Mix 1:1 with Lidocaine and swish and spit 4 times a day (Patient not taking: Reported on 03/05/2023) 355 mL 2   Bioflavonoid Products (GRAPE SEED PO) Take 1 capsule by mouth daily. (Patient not taking: Reported on 03/05/2023)     Calcium Carbonate Antacid (TUMS PO) Take 1 tablet by mouth as needed. Reported on 05/22/2015     CARBOPLATIN IV Inject into the vein every 21 ( twenty-one) days. (Patient not taking: Reported on 03/05/2023)     cephALEXin (KEFLEX) 500 MG capsule Take 1 capsule (500 mg total) by mouth 4 (four) times daily. (Patient not taking: Reported on 03/05/2023) 40 capsule 0   Cholecalciferol (VITAMIN D PO) Take 1,000 Units by mouth daily.     diphenoxylate-atropine (LOMOTIL) 2.5-0.025 MG tablet Take 1 tablet by mouth 4 (four) times daily as needed for diarrhea or loose stools. (Patient not taking: Reported on 03/05/2023) 60 tablet 0   DOCETAXEL IV Inject into the vein every 21 ( twenty-one) days. (Patient not taking: Reported on 03/05/2023)     lidocaine (XYLOCAINE) 2 % solution Use as directed 15 mLs in the mouth or throat 4 (four) times daily. Mix 1:1 with maalox and swish and spit 4 times a day (Patient not taking: Reported on 03/05/2023) 100 mL 2   lidocaine-prilocaine (EMLA) cream Apply a quarter-sized amount to port a cath site and cover with plastic wrap 1 hour prior to infusion appointments (Patient not taking:  Reported on 03/05/2023) 30 g 3   loratadine (CLARITIN) 5 MG chewable tablet Chew 5 mg by mouth daily. (Patient not taking: Reported on 03/05/2023)     montelukast (SINGULAIR) 10 MG tablet Take one 10mg  tab for 2 days before chemo (day 1 and day 2) (Patient not taking: Reported on 03/05/2023) 20 tablet 0   nitrofurantoin, macrocrystal-monohydrate, (MACROBID) 100 MG capsule Take 1 capsule (100 mg total) by mouth 2 (two) times daily. (Patient not taking: Reported on 03/05/2023) 10 capsule 0   ondansetron (ZOFRAN) 8 MG tablet Take 1 tablet (8 mg total) by mouth every 8 (eight) hours as needed for nausea or vomiting. (Patient not taking: Reported on 03/05/2023) 20 tablet 0   PERTUZUMAB IV Inject into the vein every 21 ( twenty-one) days. (Patient not taking: Reported on 03/05/2023)     predniSONE (DELTASONE) 20 MG tablet Take one 20mg  tab for 2 days before chemo (day 1 and day 2) (Patient not taking: Reported on 03/05/2023) 20 tablet 0   TRASTUZUMAB IV Inject into the vein every 21 ( twenty-one) days. (Patient not taking: Reported on 03/05/2023)     No current facility-administered medications for this encounter.    ECOG PERFORMANCE STATUS:  0 -  Asymptomatic  REVIEW OF SYSTEMS: Patient denies any weight loss, fatigue, weakness, fever, chills or night sweats. Patient denies any loss of vision, blurred vision. Patient denies any ringing  of the ears or hearing loss. No irregular heartbeat. Patient denies heart murmur or history of fainting. Patient denies any chest pain or pain radiating to her upper extremities. Patient denies any shortness of breath, difficulty breathing at night, cough or hemoptysis. Patient denies any swelling in the lower legs. Patient denies any nausea vomiting, vomiting of blood, or coffee ground material in the vomitus. Patient denies any stomach pain. Patient states has had normal bowel movements no significant constipation or diarrhea. Patient denies any dysuria, hematuria or significant  nocturia. Patient denies any problems walking, swelling in the joints or loss of balance. Patient denies any skin changes, loss of hair or loss of weight. Patient denies any excessive worrying or anxiety or significant depression. Patient denies any problems with insomnia. Patient denies excessive thirst, polyuria, polydipsia. Patient denies any swollen glands, patient denies easy bruising or easy bleeding. Patient denies any recent infections, allergies or URI. Patient "s visual fields have not changed significantly in recent time.   PHYSICAL EXAM: BP 120/61   Pulse 78   Temp 98 F (36.7 C) (Tympanic)   Resp 14   Ht 5\' 3"  (1.6 m)   Wt 92 lb 3.2 oz (41.8 kg)   LMP 07/05/2012   BMI 16.33 kg/m  Patient is status post bilateral mastectomies.  Chest wall is well-healed with no evidence of mass or nodularity.  No axillary or supraclavicular adenopathy is identified.  No lymphedema in the upper extremities is noted.  Well-developed well-nourished patient in NAD. HEENT reveals PERLA, EOMI, discs not visualized.  Oral cavity is clear. No oral mucosal lesions are identified. Neck is clear without evidence of cervical or supraclavicular adenopathy. Lungs are clear to A&P. Cardiac examination is essentially unremarkable with regular rate and rhythm without murmur rub or thrill. Abdomen is benign with no organomegaly or masses noted. Motor sensory and DTR levels are equal and symmetric in the upper and lower extremities. Cranial nerves II through XII are grossly intact. Proprioception is intact. No peripheral adenopathy or edema is identified. No motor or sensory levels are noted. Crude visual fields are within normal range.  LABORATORY DATA: Pathology reports reviewed    RADIOLOGY RESULTS: CT scans MRI scans mammograms all reviewed compatible with above-stated findings   IMPRESSION: Stage T2 NX M0 triple positive invasive mammary carcinoma the left breast which is recurrent disease in the left breast in  58 year old female also with ovarian cancer.  PLAN: This time I discussed the case both with Dr. Sonia Side and Dr. Smith Robert.  I will hold radiation till she has her ovarian surgery.  If she has advanced disease with carcinomatosis may decline radiation therapy will make that determination after pathology is reviewed.  If not I would treat her left chest wall and peripheral lymphatics.  She has a 60% or greater chance of having sentinel node involvement based on her tumor parameters and the moral [facility] sentinel lymph node prognostic nomogram.  Patient comprehends my recommendations well.  Of asked to see her back in the end of April for follow-up at which time we will make determination about radiation therapy for her breast cancer.  I would like to take this opportunity to thank you for allowing me to participate in the care of your patient.Carmina Miller, MD

## 2023-03-20 ENCOUNTER — Ambulatory Visit

## 2023-03-20 LAB — INHIBIN B: Inhibin B: 151.7 pg/mL

## 2023-03-20 LAB — CA 125: Cancer Antigen (CA) 125: 11.1 U/mL (ref 0.0–38.1)

## 2023-03-25 ENCOUNTER — Inpatient Hospital Stay: Payer: BLUE CROSS/BLUE SHIELD

## 2023-03-25 ENCOUNTER — Inpatient Hospital Stay (HOSPITAL_BASED_OUTPATIENT_CLINIC_OR_DEPARTMENT_OTHER): Payer: BLUE CROSS/BLUE SHIELD | Admitting: Nurse Practitioner

## 2023-03-25 ENCOUNTER — Encounter: Payer: Self-pay | Admitting: Nurse Practitioner

## 2023-03-25 VITALS — BP 139/62 | HR 79 | Temp 96.0°F | Resp 16

## 2023-03-25 VITALS — BP 128/52 | HR 94 | Temp 97.3°F | Resp 17

## 2023-03-25 DIAGNOSIS — Z170 Estrogen receptor positive status [ER+]: Secondary | ICD-10-CM | POA: Diagnosis not present

## 2023-03-25 DIAGNOSIS — Z1509 Genetic susceptibility to other malignant neoplasm: Secondary | ICD-10-CM

## 2023-03-25 DIAGNOSIS — Z1501 Genetic susceptibility to malignant neoplasm of breast: Secondary | ICD-10-CM | POA: Diagnosis not present

## 2023-03-25 DIAGNOSIS — Z5181 Encounter for therapeutic drug level monitoring: Secondary | ICD-10-CM | POA: Diagnosis not present

## 2023-03-25 DIAGNOSIS — Z9013 Acquired absence of bilateral breasts and nipples: Secondary | ICD-10-CM | POA: Insufficient documentation

## 2023-03-25 DIAGNOSIS — D3912 Neoplasm of uncertain behavior of left ovary: Secondary | ICD-10-CM | POA: Diagnosis not present

## 2023-03-25 DIAGNOSIS — R197 Diarrhea, unspecified: Secondary | ICD-10-CM | POA: Insufficient documentation

## 2023-03-25 DIAGNOSIS — R978 Other abnormal tumor markers: Secondary | ICD-10-CM | POA: Insufficient documentation

## 2023-03-25 DIAGNOSIS — R102 Pelvic and perineal pain: Secondary | ICD-10-CM | POA: Diagnosis not present

## 2023-03-25 DIAGNOSIS — Z5112 Encounter for antineoplastic immunotherapy: Secondary | ICD-10-CM | POA: Diagnosis present

## 2023-03-25 DIAGNOSIS — Z1731 Human epidermal growth factor receptor 2 positive status: Secondary | ICD-10-CM | POA: Diagnosis not present

## 2023-03-25 DIAGNOSIS — C50412 Malignant neoplasm of upper-outer quadrant of left female breast: Secondary | ICD-10-CM | POA: Insufficient documentation

## 2023-03-25 DIAGNOSIS — Z1502 Genetic susceptibility to malignant neoplasm of ovary: Secondary | ICD-10-CM | POA: Insufficient documentation

## 2023-03-25 DIAGNOSIS — Z79899 Other long term (current) drug therapy: Secondary | ICD-10-CM

## 2023-03-25 DIAGNOSIS — K5900 Constipation, unspecified: Secondary | ICD-10-CM | POA: Insufficient documentation

## 2023-03-25 DIAGNOSIS — Z1721 Progesterone receptor positive status: Secondary | ICD-10-CM | POA: Insufficient documentation

## 2023-03-25 DIAGNOSIS — K521 Toxic gastroenteritis and colitis: Secondary | ICD-10-CM

## 2023-03-25 DIAGNOSIS — Z148 Genetic carrier of other disease: Secondary | ICD-10-CM | POA: Diagnosis not present

## 2023-03-25 DIAGNOSIS — R1900 Intra-abdominal and pelvic swelling, mass and lump, unspecified site: Secondary | ICD-10-CM | POA: Diagnosis not present

## 2023-03-25 DIAGNOSIS — R180 Malignant ascites: Secondary | ICD-10-CM | POA: Insufficient documentation

## 2023-03-25 LAB — CBC WITH DIFFERENTIAL/PLATELET
Abs Immature Granulocytes: 0.01 10*3/uL (ref 0.00–0.07)
Basophils Absolute: 0 10*3/uL (ref 0.0–0.1)
Basophils Relative: 1 %
Eosinophils Absolute: 0 10*3/uL (ref 0.0–0.5)
Eosinophils Relative: 1 %
HCT: 36 % (ref 36.0–46.0)
Hemoglobin: 12 g/dL (ref 12.0–15.0)
Immature Granulocytes: 0 %
Lymphocytes Relative: 23 %
Lymphs Abs: 0.9 10*3/uL (ref 0.7–4.0)
MCH: 31.3 pg (ref 26.0–34.0)
MCHC: 33.3 g/dL (ref 30.0–36.0)
MCV: 94 fL (ref 80.0–100.0)
Monocytes Absolute: 0.3 10*3/uL (ref 0.1–1.0)
Monocytes Relative: 7 %
Neutro Abs: 2.7 10*3/uL (ref 1.7–7.7)
Neutrophils Relative %: 68 %
Platelets: 154 10*3/uL (ref 150–400)
RBC: 3.83 MIL/uL — ABNORMAL LOW (ref 3.87–5.11)
RDW: 13 % (ref 11.5–15.5)
WBC: 4 10*3/uL (ref 4.0–10.5)
nRBC: 0 % (ref 0.0–0.2)

## 2023-03-25 LAB — CMP (CANCER CENTER ONLY)
ALT: 15 U/L (ref 0–44)
AST: 16 U/L (ref 15–41)
Albumin: 4 g/dL (ref 3.5–5.0)
Alkaline Phosphatase: 47 U/L (ref 38–126)
Anion gap: 7 (ref 5–15)
BUN: 19 mg/dL (ref 6–20)
CO2: 28 mmol/L (ref 22–32)
Calcium: 9.1 mg/dL (ref 8.9–10.3)
Chloride: 104 mmol/L (ref 98–111)
Creatinine: 0.65 mg/dL (ref 0.44–1.00)
GFR, Estimated: >60 mL/min (ref >60)
Glucose, Bld: 100 mg/dL — ABNORMAL HIGH (ref 70–99)
Potassium: 3.7 mmol/L (ref 3.5–5.1)
Sodium: 139 mmol/L (ref 135–145)
Total Bilirubin: 0.6 mg/dL (ref 0.0–1.2)
Total Protein: 6.2 g/dL — ABNORMAL LOW (ref 6.5–8.1)

## 2023-03-25 MED ORDER — FAMOTIDINE IN NACL 20-0.9 MG/50ML-% IV SOLN
20.0000 mg | Freq: Once | INTRAVENOUS | Status: AC
  Administered 2023-03-25: 20 mg via INTRAVENOUS
  Filled 2023-03-25: qty 50

## 2023-03-25 MED ORDER — HEPARIN SOD (PORK) LOCK FLUSH 100 UNIT/ML IV SOLN
500.0000 [IU] | Freq: Once | INTRAVENOUS | Status: AC | PRN
  Administered 2023-03-25: 500 [IU]
  Filled 2023-03-25: qty 5

## 2023-03-25 MED ORDER — TRASTUZUMAB-ANNS CHEMO 150 MG IV SOLR
6.0000 mg/kg | Freq: Once | INTRAVENOUS | Status: AC
  Administered 2023-03-25: 252 mg via INTRAVENOUS
  Filled 2023-03-25: qty 12

## 2023-03-25 MED ORDER — CETIRIZINE HCL 10 MG/ML IV SOLN
10.0000 mg | Freq: Once | INTRAVENOUS | Status: AC
  Administered 2023-03-25: 10 mg via INTRAVENOUS
  Filled 2023-03-25: qty 1

## 2023-03-25 MED ORDER — METHYLPREDNISOLONE SODIUM SUCC 125 MG IJ SOLR
125.0000 mg | Freq: Once | INTRAMUSCULAR | Status: AC
  Administered 2023-03-25: 125 mg via INTRAVENOUS
  Filled 2023-03-25: qty 2

## 2023-03-25 MED ORDER — SODIUM CHLORIDE 0.9 % IV SOLN
420.0000 mg | Freq: Once | INTRAVENOUS | Status: AC
  Administered 2023-03-25: 420 mg via INTRAVENOUS
  Filled 2023-03-25: qty 14

## 2023-03-25 MED ORDER — SODIUM CHLORIDE 0.9 % IV SOLN
INTRAVENOUS | Status: DC
  Filled 2023-03-25: qty 250

## 2023-03-25 MED ORDER — ACETAMINOPHEN 325 MG PO TABS
650.0000 mg | ORAL_TABLET | Freq: Once | ORAL | Status: AC
  Administered 2023-03-25: 650 mg via ORAL
  Filled 2023-03-25: qty 2

## 2023-03-25 NOTE — Progress Notes (Signed)
 Hematology/Oncology Consult Note [facility]  Telephone:([phone] [phone]  Patient Care Team: Babs Sciara, MD as PCP - General (Family Medicine) Doreatha Massed, MD as Medical Oncologist (Medical Oncology) Therese Sarah, RN as Oncology Nurse Navigator (Medical Oncology) Creig Hines, MD as Consulting Physician (Oncology) Benita Gutter, RN as Oncology Nurse Navigator Carmina Miller, MD as Consulting Physician (Radiation Oncology)   Name of the patient: Melissa Rodriguez  [id number]  05/27/1965   Date of visit: 03/25/23  Diagnosis- pathologic prognostic stage Ib invasive mammary carcinoma of the left breast T2 N1 M0 ER/PR positive HER2 positive   Chief complaint/ Reason for visit-on treatment assessment prior to cycle 2 of maintenance Herceptin and Perjeta  Heme/Onc history: Patient presented as a 58 year old female with a prior history of lumpectomy in 2008 which showed a 7 mm stage I ER/PR positive HER2 negative left breast cancer.  She underwent lumpectomy and adjuvant radiation therapy but did not go through endocrine therapy.   She self palpated a left breast lump led to a diagnostic mammogram in July 2024.  Diagnostic mammogram showed 1.9 cm irregular hypoechoic mass 1 o'clock position 7 cm from the breast grade 3 ER 99% positive, PR 85% positive and HER2 equivocal by IHC and FISH positive.  Ki-67 35% there were calcifications noted in the right breast as well which were biopsy-proven benign.     She underwent genetic testing and was found to have BRCA2 mutation   Patient was seen by Dr. Ellin Saba and he recommended upfront surgery and consideration for adjuvant chemotherapy.  Patient underwent bilateral mastectomy on 08/15/2022.  No evidence of malignancy in the right breast.  There were 2 distinct masses noted in the left breast with dominant mass measured 2.4 x 2.3 x 1.6 cm grade 3 with negative margins.  There was another 1 cm mass  where there was uptake of radiotracer dye with positive posterior lateral anterior margin.  This was believed to be the sentinel lymph node although on pathology it states that there were no lymph nodes identified likely due to prior lumpectomy. mpT2.  She was staged as T2 N1 triple positive disease.   She was recommended adjuvant TCHP chemotherapy and received cycle 1 on 10/14/2022. She completed 5 cycles on 02/03/23 followed by maintenance herceptin perjeta started on 03/05/23.   Interval history- She returns to clinic for reevaluation and consideration of hercpetin-perjeta. She continues to recover from chemotherapy. Generally feels tired. Appetite is down but stable. Weight is stable. She has plans to undergo debulking surgery with gyn onc at [facility] later this month. She has some cramping pain but rates as mild. She has episodes of diarrhea following chemotherapy which improves with diarrhea.   ECOG PS- 1 Pain scale- 0  Review of systems- Review of Systems  Constitutional:  Positive for malaise/fatigue. Negative for chills, fever and weight loss.  HENT:  Negative for congestion, ear discharge and nosebleeds.   Eyes:  Negative for blurred vision.  Respiratory:  Negative for cough, hemoptysis, sputum production, shortness of breath and wheezing.   Cardiovascular:  Negative for chest pain, palpitations, orthopnea and claudication.  Gastrointestinal:  Positive for diarrhea. Negative for abdominal pain, blood in stool, constipation, heartburn, melena, nausea and vomiting.  Genitourinary:  Negative for dysuria, flank pain, frequency, hematuria and urgency.  Musculoskeletal:  Negative for back pain, joint pain and myalgias.  Skin:  Negative for rash.  Neurological:  Negative for dizziness, tingling, focal weakness, seizures, weakness and headaches.  Endo/Heme/Allergies:  Does not bruise/bleed easily.  Psychiatric/Behavioral:  Negative for depression and suicidal ideas. The patient does not have  insomnia.     Allergies  Allergen Reactions   Benadryl [Diphenhydramine Hcl]     Jittery and Hallucinations   Diphenhydramine Hcl Anxiety    Other Reaction(s): Hallucination  Jittery and Hallucinations    Made her feel like she was crawling the walls.   Hydrocodone Shortness Of Breath    Felt like she could not breathe-February 2020   Penicillins Hives and Rash    Did it involve swelling of the face/tongue/throat, SOB, or low BP? No  Did it involve sudden or severe rash/hives, skin peeling, or any reaction on the inside of your mouth or nose? No  Did you need to seek medical attention at a hospital or doctor's office? No  When did it last happen?        If all above answers are "NO", may proceed with cephalosporin use.  Any medicine related to PCN    Did it involve swelling of the face/tongue/throat, SOB, or low BP? No Did it involve sudden or severe rash/hives, skin peeling, or any reaction on the inside of your mouth or nose? No Did you need to seek medical attention at a hospital or doctor's office? No When did it last happen?       If all above answers are "NO", may proceed with cephalosporin use.   Ciprofloxacin Hives   Docetaxel Other (See Comments)    Tingling of lips, teeth hurting, hot tongue   Trastuzumab-Anns [Trastuzumab-Pkrb] Itching    Tingling and hot tongue and lips   Past Medical History:  Diagnosis Date   Adenocarcinoma of breast (HCC) 05/20/2011   Stage I (T1b N0), grade 1, well-differentiated adenocarcinoma of the medial lower quadrant of the left breast. It was a tubular carcinoma, status post lumpectomy on 03/13/2006, ER positive 79%, PR positive 92%, HER2/neu negative, Ki-67 marker low at 18%. Her cancer was 7 mm in size, and she had 4 sentinel nodes all negative along with her lumpectomy. She was treated with radiation therapy postoper   Complication of anesthesia    pt states her bladder did not wake up after one surgery. Went to ER, had a urinary  catheter for 1 week   History of kidney stones    Iron deficiency    Iron deficiency anemia 05/20/2011   Secondary to GU bleeding.  Good absorber of PO iron   Personal history of radiation therapy    Past Surgical History:  Procedure Laterality Date   BREAST BIOPSY Left 07/16/2022   [place] LT BREAST BX W LOC DEV 1ST LESION IMG BX SPEC US GUIDE 07/16/2022 Edwin Cap, MD [facility]   BREAST BIOPSY Right 07/26/2022   MM RT BREAST BX W LOC DEV 1ST LESION IMAGE BX SPEC STEREO GUIDE 07/26/2022 [facility]   BREAST LUMPECTOMY Left    EXTRACORPOREAL SHOCK WAVE LITHOTRIPSY Left 02/26/2018   Procedure: EXTRACORPOREAL SHOCK WAVE LITHOTRIPSY (ESWL);  Surgeon: Malen Gauze, MD;  Location: [facility];  Service: Urology;  Laterality: Left;   LUMPECTOM LEFT BREAST  02/2006   LYMPH NODE BIOPSY LEFT  04/2006   MASTECTOMY W/ SENTINEL NODE BIOPSY Left 08/15/2022   Procedure: LEFT SIMPLE MASTECTOMY, LEFT SENTINEL LYMPH NODE MAPPING;  Surgeon: Harriette Bouillon, MD;  Location: [facility];  Service: General;  Laterality: Left;   PORTACATH PLACEMENT N/A 10/09/2022   Procedure: INSERTION PORT-A-CATH;  Surgeon: Harriette Bouillon, MD;  Location: [facility]  [place];  Service: General;  Laterality: N/A;   SIMPLE MASTECTOMY WITH AXILLARY SENTINEL NODE BIOPSY Right 08/15/2022   Procedure: RIGHT RISK REDUCING MASTECTOMY;  Surgeon: Harriette Bouillon, MD;  Location: [facility];  Service: General;  Laterality: Right;   Social History   Socioeconomic History   Marital status: Married    Spouse name: Not on file   Number of children: Not on file   Years of education: Not on file   Highest education level: Some college, no degree  Occupational History   Not on file  Tobacco Use   Smoking status: Never   Smokeless tobacco: Never  Vaping Use   Vaping status: Never Used  Substance and Sexual Activity   Alcohol use: No   Drug use: No   Sexual activity: Yes    Birth  control/protection: Post-menopausal  Other Topics Concern   Not on file  Social History Narrative   Not on file   Social Drivers of Health   Financial Resource Strain: Patient Declined (10/16/2022)   Overall Financial Resource Strain (CARDIA)    Difficulty of Paying Living Expenses: Patient declined  Recent Concern: Physicist, medical Strain - High Risk (10/03/2022)   Overall Financial Resource Strain (CARDIA)    Difficulty of Paying Living Expenses: Hard  Food Insecurity: No Food Insecurity (11/27/2022)   Hunger Vital Sign    Worried About Running Out of Food in the Last Year: Never true    Ran Out of Food in the Last Year: Never true  Transportation Needs: No Transportation Needs (11/27/2022)   PRAPARE - Administrator, Civil Service (Medical): No    Lack of Transportation (Non-Medical): No  Physical Activity: Insufficiently Active (10/16/2022)   Exercise Vital Sign    Days of Exercise per Week: 1 day    Minutes of Exercise per Session: 20 min  Stress: No Stress Concern Present (10/16/2022)   [employer] of Occupational Health - Occupational Stress Questionnaire    Feeling of Stress : Not at all  Social Connections: Unknown (10/16/2022)   Social Connection and Isolation Panel [NHANES]    Frequency of Communication with Friends and Family: Patient declined    Frequency of Social Gatherings with Friends and Family: Patient declined    Attends Religious Services: More than 4 times per year    Active Member of [employer] or Organizations: Yes    Attends Engineer, structural: More than 4 times per year    Marital Status: Married  Catering manager Violence: Not At Risk (11/27/2022)   Humiliation, Afraid, Rape, and Kick questionnaire    Fear of Current or Ex-Partner: No    Emotionally Abused: No    Physically Abused: No    Sexually Abused: No   Family History  Problem Relation Age of Onset   Hypertension Mother    Hypertension Father    Heart disease Father  61       MI in his 51s   Cancer Maternal Aunt        unk type, possibly hip. dx >50   Breast cancer Paternal Aunt        dx 50s   Cancer Paternal Aunt        unknown, d. 23s   Prostate cancer Cousin        dx 63s-50s   Stomach cancer Cousin        dx 40s-50s    Current Outpatient Medications:    aluminum-magnesium hydroxide 200-200 MG/5ML suspension, Take  15 mLs by mouth every 6 (six) hours as needed for indigestion. Mix 1:1 with Lidocaine and swish and spit 4 times a day (Patient not taking: Reported on 03/25/2023), Disp: 355 mL, Rfl: 2   Bioflavonoid Products (GRAPE SEED PO), Take 1 capsule by mouth daily. (Patient not taking: Reported on 03/25/2023), Disp: , Rfl:    Calcium Carbonate Antacid (TUMS PO), Take 1 tablet by mouth as needed. Reported on 05/22/2015 (Patient not taking: Reported on 03/25/2023), Disp: , Rfl:    CARBOPLATIN IV, Inject into the vein every 21 ( twenty-one) days. (Patient not taking: Reported on 03/25/2023), Disp: , Rfl:    cephALEXin (KEFLEX) 500 MG capsule, Take 1 capsule (500 mg total) by mouth 4 (four) times daily. (Patient not taking: Reported on 03/25/2023), Disp: 40 capsule, Rfl: 0   Cholecalciferol (VITAMIN D PO), Take 1,000 Units by mouth daily. (Patient not taking: Reported on 03/25/2023), Disp: , Rfl:    diphenoxylate-atropine (LOMOTIL) 2.5-0.025 MG tablet, Take 1 tablet by mouth 4 (four) times daily as needed for diarrhea or loose stools. (Patient not taking: Reported on 03/25/2023), Disp: 60 tablet, Rfl: 0   DOCETAXEL IV, Inject into the vein every 21 ( twenty-one) days. (Patient not taking: Reported on 03/25/2023), Disp: , Rfl:    lidocaine (XYLOCAINE) 2 % solution, Use as directed 15 mLs in the mouth or throat 4 (four) times daily. Mix 1:1 with maalox and swish and spit 4 times a day (Patient not taking: Reported on 03/25/2023), Disp: 100 mL, Rfl: 2   lidocaine-prilocaine (EMLA) cream, Apply a quarter-sized amount to port a cath site and cover with plastic wrap 1  hour prior to infusion appointments (Patient not taking: Reported on 03/25/2023), Disp: 30 g, Rfl: 3   loratadine (CLARITIN) 5 MG chewable tablet, Chew 5 mg by mouth daily. (Patient not taking: Reported on 03/25/2023), Disp: , Rfl:    montelukast (SINGULAIR) 10 MG tablet, Take one 10mg  tab for 2 days before chemo (day 1 and day 2) (Patient not taking: Reported on 03/25/2023), Disp: 20 tablet, Rfl: 0   nitrofurantoin, macrocrystal-monohydrate, (MACROBID) 100 MG capsule, Take 1 capsule (100 mg total) by mouth 2 (two) times daily. (Patient not taking: Reported on 03/25/2023), Disp: 10 capsule, Rfl: 0   ondansetron (ZOFRAN) 8 MG tablet, Take 1 tablet (8 mg total) by mouth every 8 (eight) hours as needed for nausea or vomiting. (Patient not taking: Reported on 03/25/2023), Disp: 20 tablet, Rfl: 0   PERTUZUMAB IV, Inject into the vein every 21 ( twenty-one) days. (Patient not taking: Reported on 03/25/2023), Disp: , Rfl:    predniSONE (DELTASONE) 20 MG tablet, Take one 20mg  tab for 2 days before chemo (day 1 and day 2) (Patient not taking: Reported on 03/25/2023), Disp: 20 tablet, Rfl: 0   TRASTUZUMAB IV, Inject into the vein every 21 ( twenty-one) days. (Patient not taking: Reported on 03/25/2023), Disp: , Rfl:   Physical exam:  Vitals:   03/25/23 0839  BP: 139/62  Pulse: 79  Resp: 16  Temp: (!) 96 F (35.6 C)  TempSrc: Tympanic  SpO2: 100%   Physical Exam Constitutional:      Comments: She is thin and cachectic. No acute distress  Cardiovascular:     Rate and Rhythm: Normal rate and regular rhythm.     Heart sounds: Normal heart sounds.  Pulmonary:     Effort: Pulmonary effort is normal.     Breath sounds: Normal breath sounds.  Skin:    General: Skin is  warm and dry.  Neurological:     Mental Status: She is alert and oriented to person, place, and time.        Latest Ref Rng & Units 03/25/2023    8:28 AM  CMP  Glucose 70 - 99 mg/dL 045   BUN 6 - 20 mg/dL 19   Creatinine 4.09 - 1.00 mg/dL  8.11   Sodium 914 - 782 mmol/L 139   Potassium 3.5 - 5.1 mmol/L 3.7   Chloride 98 - 111 mmol/L 104   CO2 22 - 32 mmol/L 28   Calcium 8.9 - 10.3 mg/dL 9.1   Total Protein 6.5 - 8.1 g/dL 6.2   Total Bilirubin 0.0 - 1.2 mg/dL 0.6   Alkaline Phos 38 - 126 U/L 47   AST 15 - 41 U/L 16   ALT 0 - 44 U/L 15       Latest Ref Rng & Units 03/25/2023    8:28 AM  CBC  WBC 4.0 - 10.5 K/uL 4.0   Hemoglobin 12.0 - 15.0 g/dL 95.6   Hematocrit 21.3 - 46.0 % 36.0   Platelets 150 - 400 K/uL 154    Imaging: Interval CT C/A/P w contrast from [facility] was reviewed. She has seen gyn onc.    Assessment and plan- Patient is a 58 y.o. female   History of BRCA2 mutation and second left breast cancer stage Ib MP T2 N0 versus pT1 cN1 M0 ER/PR positive HER2 positive.  She is here for on treatment assessment prior to cycle 2 of maintenance Herceptin and Perjeta. Labs reviewed and acceptable for treatment. She will also benefit from adjuvant radiation therapy. She was evaluated by Dr Karoline Caldwell in the past but wishes to receive radiation at [facility]. Plan to hold radiation for breast cancer until after her surgery. She will also benefit from endocrine therapy which we will discuss after she completes gyn surgery and radiation.  Drug-induced diarrhea: Continue as needed Imodium and Lomotil.  Diarrhea could be secondary to either Herceptin and Perjeta versus prior chemotherapy. It has improved but not yet resolved.  Will monitor closely. Plan to repeat echo later this week (performed at [facility]). Weight is stable.  Pelvis Mass- s/p gyn onc evaluation for surgical debulking in setting of brca2 mutation. Mass has been slowly enlarging since 2020 and now measures 8.8 cm solid left adnexal mass, possible omental disease with elevated inhibin B. Plan is for surgery with Dr Johnnette Litter on 04/08/23 at [facility]. We will push out her next cycle of chemotherapy to 04/23/23 to allow her to recover from surgery. She is scheduled  to see Dr Sonia Side at [facility] on 04/01/23 for pre-op.   Disposition:  Echo asap Cancel 4/2 appts and infusion F/u tbd- la    Visit Diagnosis 1. Encounter for monoclonal antibody treatment for malignancy   2. Encounter for monitoring cardiotoxic drug therapy   3. BRCA2 gene mutation positive   4. Pelvic mass in female   5. Drug-induced diarrhea    Consuello Masse, DNP, AGNP-C, [facility] at [facility] [phone] (clinic) 03/25/2023

## 2023-03-25 NOTE — Patient Instructions (Signed)
 [facility] - A DEPT OF [facility]  Discharge Instructions: Thank you for choosing [facility] to provide your oncology and hematology care.  If you have a lab appointment with the Cancer Center, please go directly to the Cancer Center and check in at the registration area.  Wear comfortable clothing and clothing appropriate for easy access to any Portacath or PICC line.   We strive to give you quality time with your provider. You may need to reschedule your appointment if you arrive late (15 or more minutes).  Arriving late affects you and other patients whose appointments are after yours.  Also, if you miss three or more appointments without notifying the office, you may be dismissed from the clinic at the provider's discretion.      For prescription refill requests, have your pharmacy contact our office and allow 72 hours for refills to be completed.    Today you received the following chemotherapy and/or immunotherapy agents kanjinti and perjeta      To help prevent nausea and vomiting after your treatment, we encourage you to take your nausea medication as directed.  BELOW ARE SYMPTOMS THAT SHOULD BE REPORTED IMMEDIATELY: *FEVER GREATER THAN 100.4 F (38 C) OR HIGHER *CHILLS OR SWEATING *NAUSEA AND VOMITING THAT IS NOT CONTROLLED WITH YOUR NAUSEA MEDICATION *UNUSUAL SHORTNESS OF BREATH *UNUSUAL BRUISING OR BLEEDING *URINARY PROBLEMS (pain or burning when urinating, or frequent urination) *BOWEL PROBLEMS (unusual diarrhea, constipation, pain near the anus) TENDERNESS IN MOUTH AND THROAT WITH OR WITHOUT PRESENCE OF ULCERS (sore throat, sores in mouth, or a toothache) UNUSUAL RASH, SWELLING OR PAIN  UNUSUAL VAGINAL DISCHARGE OR ITCHING   Items with * indicate a potential emergency and should be followed up as soon as possible or go to the Emergency Department if any problems should occur.  Please show the CHEMOTHERAPY ALERT CARD or  IMMUNOTHERAPY ALERT CARD at check-in to the Emergency Department and triage nurse.  Should you have questions after your visit or need to cancel or reschedule your appointment, please contact [facility] - A DEPT OF [facility]  [phone] and follow the prompts.  Office hours are 8:00 a.m. to 4:30 p.m. Monday - Friday. Please note that voicemails left after 4:00 p.m. may not be returned until the following business day.  We are closed weekends and major holidays. You have access to a nurse at all times for urgent questions. Please call the main number to the clinic [phone] and follow the prompts.  For any non-urgent questions, you may also contact your provider using MyChart. We now offer e-Visits for anyone 58 and older to request care online for non-urgent symptoms. For details visit [email or web address].   Also download the MyChart app! Go to the app store, search "MyChart", open the app, select , and log in with your MyChart username and password.

## 2023-03-25 NOTE — Progress Notes (Signed)
 Nutrition Follow-up:   Patient with left breast cancer, triple positive.  Completed TCHP chemotherapy.  Receiving herceptin and perjeta.    Met with patient during infusion. Reports that appetite is better.  Has been drinking maca tea and manuka honey. Feels these have helped her diarrhea and stomach issues.  Has been able to add a few more foods to her diet.  Surgey planned for April 10th.      Medications: MVI  Labs: reviewed  Anthropometrics:   Weight 92 lb today per patient  92 lb 3.2 oz on 3/5 90 lb on 2/19 86 lb on 1/20 92 lb on 12/9 91 lb 4.8 oz on 11/20   NUTRITION DIAGNOSIS: Inadequate oral intake improving and weight stable   INTERVENTION:  Continue pushing calories and protein especially with upcoming surgery Continue MVI daily    MONITORING, EVALUATION, GOAL: weight trends, intake   NEXT VISIT: as needed following surgery  Carol Theys B. Freida Busman, RD, LDN Registered Dietitian [phone]

## 2023-03-27 ENCOUNTER — Encounter: Payer: Self-pay | Admitting: Oncology

## 2023-03-28 ENCOUNTER — Ambulatory Visit: Payer: BLUE CROSS/BLUE SHIELD

## 2023-04-01 ENCOUNTER — Other Ambulatory Visit: Payer: Self-pay | Admitting: Oncology

## 2023-04-01 ENCOUNTER — Encounter: Payer: Self-pay | Admitting: Oncology

## 2023-04-01 DIAGNOSIS — C50412 Malignant neoplasm of upper-outer quadrant of left female breast: Secondary | ICD-10-CM

## 2023-04-01 NOTE — Telephone Encounter (Signed)
 Chemo deferred by 1 week

## 2023-04-04 ENCOUNTER — Telehealth: Payer: Self-pay | Admitting: *Deleted

## 2023-04-04 NOTE — Telephone Encounter (Signed)
 She wanted to ask for ECHO for 03/15/23. I called her back and said that she did not get ECHO on that day

## 2023-04-16 ENCOUNTER — Ambulatory Visit: Payer: BLUE CROSS/BLUE SHIELD

## 2023-04-16 ENCOUNTER — Ambulatory Visit: Payer: BLUE CROSS/BLUE SHIELD | Admitting: Oncology

## 2023-04-21 ENCOUNTER — Ambulatory Visit

## 2023-04-23 ENCOUNTER — Inpatient Hospital Stay (HOSPITAL_BASED_OUTPATIENT_CLINIC_OR_DEPARTMENT_OTHER): Admitting: Oncology

## 2023-04-23 ENCOUNTER — Encounter: Payer: Self-pay | Admitting: Oncology

## 2023-04-23 ENCOUNTER — Inpatient Hospital Stay: Attending: Hematology

## 2023-04-23 VITALS — BP 118/58 | HR 78 | Temp 96.9°F | Resp 19

## 2023-04-23 VITALS — BP 122/58 | HR 76 | Temp 98.7°F | Resp 18 | Ht 63.0 in | Wt 90.7 lb

## 2023-04-23 DIAGNOSIS — Z170 Estrogen receptor positive status [ER+]: Secondary | ICD-10-CM | POA: Diagnosis not present

## 2023-04-23 DIAGNOSIS — Z1501 Genetic susceptibility to malignant neoplasm of breast: Secondary | ICD-10-CM | POA: Diagnosis not present

## 2023-04-23 DIAGNOSIS — Z90722 Acquired absence of ovaries, bilateral: Secondary | ICD-10-CM | POA: Insufficient documentation

## 2023-04-23 DIAGNOSIS — D3912 Neoplasm of uncertain behavior of left ovary: Secondary | ICD-10-CM | POA: Insufficient documentation

## 2023-04-23 DIAGNOSIS — C50412 Malignant neoplasm of upper-outer quadrant of left female breast: Secondary | ICD-10-CM

## 2023-04-23 DIAGNOSIS — R5383 Other fatigue: Secondary | ICD-10-CM | POA: Insufficient documentation

## 2023-04-23 DIAGNOSIS — R978 Other abnormal tumor markers: Secondary | ICD-10-CM | POA: Diagnosis not present

## 2023-04-23 DIAGNOSIS — Z9013 Acquired absence of bilateral breasts and nipples: Secondary | ICD-10-CM | POA: Diagnosis not present

## 2023-04-23 DIAGNOSIS — Z1721 Progesterone receptor positive status: Secondary | ICD-10-CM | POA: Diagnosis not present

## 2023-04-23 DIAGNOSIS — Z5112 Encounter for antineoplastic immunotherapy: Secondary | ICD-10-CM | POA: Insufficient documentation

## 2023-04-23 DIAGNOSIS — Z90710 Acquired absence of both cervix and uterus: Secondary | ICD-10-CM | POA: Insufficient documentation

## 2023-04-23 MED ORDER — FAMOTIDINE IN NACL 20-0.9 MG/50ML-% IV SOLN
20.0000 mg | Freq: Once | INTRAVENOUS | Status: AC
  Administered 2023-04-23: 20 mg via INTRAVENOUS
  Filled 2023-04-23: qty 50

## 2023-04-23 MED ORDER — SODIUM CHLORIDE 0.9 % IV SOLN
420.0000 mg | Freq: Once | INTRAVENOUS | Status: AC
  Administered 2023-04-23: 420 mg via INTRAVENOUS
  Filled 2023-04-23: qty 14

## 2023-04-23 MED ORDER — CETIRIZINE HCL 10 MG/ML IV SOLN
10.0000 mg | Freq: Once | INTRAVENOUS | Status: AC
  Administered 2023-04-23: 10 mg via INTRAVENOUS
  Filled 2023-04-23: qty 1

## 2023-04-23 MED ORDER — SODIUM CHLORIDE 0.9 % IV SOLN
INTRAVENOUS | Status: DC
Start: 2023-04-23 — End: 2023-04-23
  Filled 2023-04-23: qty 250

## 2023-04-23 MED ORDER — TRASTUZUMAB-ANNS CHEMO 150 MG IV SOLR
6.0000 mg/kg | Freq: Once | INTRAVENOUS | Status: AC
  Administered 2023-04-23: 252 mg via INTRAVENOUS
  Filled 2023-04-23: qty 12

## 2023-04-23 MED ORDER — METHYLPREDNISOLONE SODIUM SUCC 125 MG IJ SOLR
125.0000 mg | Freq: Once | INTRAMUSCULAR | Status: AC
Start: 2023-04-23 — End: 2023-04-23
  Administered 2023-04-23: 125 mg via INTRAVENOUS
  Filled 2023-04-23: qty 2

## 2023-04-23 MED ORDER — HEPARIN SOD (PORK) LOCK FLUSH 100 UNIT/ML IV SOLN
500.0000 [IU] | Freq: Once | INTRAVENOUS | Status: AC | PRN
  Administered 2023-04-23: 500 [IU]
  Filled 2023-04-23: qty 5

## 2023-04-23 MED ORDER — ACETAMINOPHEN 325 MG PO TABS
650.0000 mg | ORAL_TABLET | Freq: Once | ORAL | Status: AC
  Administered 2023-04-23: 650 mg via ORAL
  Filled 2023-04-23: qty 2

## 2023-04-23 NOTE — Patient Instructions (Signed)
 [facility] - A DEPT OF [facility]  Discharge Instructions: Thank you for choosing [facility] to provide your oncology and hematology care.  If you have a lab appointment with the Cancer Center, please go directly to the Cancer Center and check in at the registration area.  Wear comfortable clothing and clothing appropriate for easy access to any Portacath or PICC line.   We strive to give you quality time with your provider. You may need to reschedule your appointment if you arrive late (15 or more minutes).  Arriving late affects you and other patients whose appointments are after yours.  Also, if you miss three or more appointments without notifying the office, you may be dismissed from the clinic at the provider's discretion.      For prescription refill requests, have your pharmacy contact our office and allow 72 hours for refills to be completed.    Today you received the following chemotherapy and/or immunotherapy agents kanjinti and perjeta      To help prevent nausea and vomiting after your treatment, we encourage you to take your nausea medication as directed.  BELOW ARE SYMPTOMS THAT SHOULD BE REPORTED IMMEDIATELY: *FEVER GREATER THAN 100.4 F (38 C) OR HIGHER *CHILLS OR SWEATING *NAUSEA AND VOMITING THAT IS NOT CONTROLLED WITH YOUR NAUSEA MEDICATION *UNUSUAL SHORTNESS OF BREATH *UNUSUAL BRUISING OR BLEEDING *URINARY PROBLEMS (pain or burning when urinating, or frequent urination) *BOWEL PROBLEMS (unusual diarrhea, constipation, pain near the anus) TENDERNESS IN MOUTH AND THROAT WITH OR WITHOUT PRESENCE OF ULCERS (sore throat, sores in mouth, or a toothache) UNUSUAL RASH, SWELLING OR PAIN  UNUSUAL VAGINAL DISCHARGE OR ITCHING   Items with * indicate a potential emergency and should be followed up as soon as possible or go to the Emergency Department if any problems should occur.  Please show the CHEMOTHERAPY ALERT CARD or  IMMUNOTHERAPY ALERT CARD at check-in to the Emergency Department and triage nurse.  Should you have questions after your visit or need to cancel or reschedule your appointment, please contact [facility] - A DEPT OF [facility]  [phone] and follow the prompts.  Office hours are 8:00 a.m. to 4:30 p.m. Monday - Friday. Please note that voicemails left after 4:00 p.m. may not be returned until the following business day.  We are closed weekends and major holidays. You have access to a nurse at all times for urgent questions. Please call the main number to the clinic [phone] and follow the prompts.  For any non-urgent questions, you may also contact your provider using MyChart. We now offer e-Visits for anyone 58 and older to request care online for non-urgent symptoms. For details visit [email or web address].   Also download the MyChart app! Go to the app store, search "MyChart", open the app, select , and log in with your MyChart username and password.

## 2023-04-23 NOTE — Progress Notes (Signed)
 Hematology/Oncology Consult note [facility]  Telephone:([phone] [phone]  Patient Care Team: Babs Sciara, MD as PCP - General (Family Medicine) Doreatha Massed, MD as Medical Oncologist (Medical Oncology) Therese Sarah, RN as Oncology Nurse Navigator (Medical Oncology) Creig Hines, MD as Consulting Physician (Oncology) Benita Gutter, RN as Oncology Nurse Navigator Carmina Miller, MD as Consulting Physician (Radiation Oncology)   Name of the patient: Melissa Rodriguez  [id number]  1965-09-26   Date of visit: 04/23/23  Diagnosis- pathologic prognostic stage Ib invasive mammary carcinoma of the left breast T2 N1 M0 ER/PR positive HER2 positive   Chief complaint/ Reason for visit-on treatment assessment prior to cycle 3 of maintenance Herceptin and Perjeta  Heme/Onc history: Patient is a 58 year old female with a prior history of lumpectomy in 2008 which showed a 7 mm stage I ER/PR positive HER2 negative left breast cancer.  She underwent lumpectomy and adjuvant radiation therapy but did not go through endocrine therapy.   She self palpated a left breast lump led to a diagnostic mammogram in July 2024.  Diagnostic mammogram showed 1.9 cm irregular hypoechoic mass 1 o'clock position 7 cm from the breast grade 3 ER 99% positive, PR 85% positive and HER2 equivocal by IHC and FISH positive.  Ki-67 35% there were calcifications noted in the right breast as well which were biopsy-proven benign.     She underwent genetic testing and was found to have BRCA2 mutation   Patient was seen by Dr. Ellin Saba and he recommended upfront surgery and consideration for adjuvant chemotherapy.  Patient underwent bilateral mastectomy on 08/15/2022.  No evidence of malignancy in the right breast.  There were 2 distinct masses noted in the left breast with dominant mass measured 2.4 x 2.3 x 1.6 cm grade 3 with negative margins.  There was another 1 cm mass where  there was uptake of radiotracer dye with positive posterior lateral anterior margin.  This was believed to be the sentinel lymph node although on pathology it states that there were no lymph nodes identified likely due to prior lumpectomy. mpT2.  She was staged as T2 N1 triple positive disease.   She was recommended adjuvant TCHP chemotherapy and received cycle 1 on 10/14/2022. Patient found to have 7.9 cm lobulated left ovarian mass which was resected at [facility] in March 2025.  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  No pathologic diagnosis was noted in the uterus contralateral ovary and fallopian tube.    Interval history-patient is recovering well from her surgery and denies any complaints at this time.  Appetite is getting better.  She has not had any significant diarrhea in the last couple of weeks.  ECOG PS- 1 Pain scale- 0   Review of systems- Review of Systems  Constitutional:  Positive for malaise/fatigue. Negative for chills, fever and weight loss.  HENT:  Negative for congestion, ear discharge and nosebleeds.   Eyes:  Negative for blurred vision.  Respiratory:  Negative for cough, hemoptysis, sputum production, shortness of breath and wheezing.   Cardiovascular:  Negative for chest pain, palpitations, orthopnea and claudication.  Gastrointestinal:  Negative for abdominal pain, blood in stool, constipation, diarrhea, heartburn, melena, nausea and vomiting.  Genitourinary:  Negative for dysuria, flank pain, frequency, hematuria and urgency.  Musculoskeletal:  Negative for back pain, joint pain and myalgias.  Skin:  Negative for rash.  Neurological:  Negative for dizziness, tingling, focal weakness, seizures,  weakness and headaches.  Endo/Heme/Allergies:  Does not bruise/bleed easily.  Psychiatric/Behavioral:  Negative for depression and suicidal ideas. The patient does not have insomnia.       Allergies  Allergen Reactions    Benadryl [Diphenhydramine Hcl]     Jittery and Hallucinations   Diphenhydramine Hcl Anxiety    Other Reaction(s): Hallucination  Jittery and Hallucinations    Made her feel like she was crawling the walls.   Hydrocodone Shortness Of Breath    Felt like she could not breathe-February 2020   Penicillins Hives and Rash    Did it involve swelling of the face/tongue/throat, SOB, or low BP? No  Did it involve sudden or severe rash/hives, skin peeling, or any reaction on the inside of your mouth or nose? No  Did you need to seek medical attention at a hospital or doctor's office? No  When did it last happen?        If all above answers are "NO", may proceed with cephalosporin use.  Any medicine related to PCN    Did it involve swelling of the face/tongue/throat, SOB, or low BP? No Did it involve sudden or severe rash/hives, skin peeling, or any reaction on the inside of your mouth or nose? No Did you need to seek medical attention at a hospital or doctor's office? No When did it last happen?       If all above answers are "NO", may proceed with cephalosporin use.   Ciprofloxacin Hives   Docetaxel Other (See Comments)    Tingling of lips, teeth hurting, hot tongue   Trastuzumab-Anns [Trastuzumab-Pkrb] Itching    Tingling and hot tongue and lips     Past Medical History:  Diagnosis Date   Adenocarcinoma of breast (HCC) 05/20/2011   Stage I (T1b N0), grade 1, well-differentiated adenocarcinoma of the medial lower quadrant of the left breast. It was a tubular carcinoma, status post lumpectomy on 03/13/2006, ER positive 79%, PR positive 92%, HER2/neu negative, Ki-67 marker low at 18%. Her cancer was 7 mm in size, and she had 4 sentinel nodes all negative along with her lumpectomy. She was treated with radiation therapy postoper   Complication of anesthesia    pt states her bladder did not wake up after one surgery. Went to ER, had a urinary catheter for 1 week   History of kidney stones     Iron deficiency    Iron deficiency anemia 05/20/2011   Secondary to GU bleeding.  Good absorber of PO iron   Personal history of radiation therapy      Past Surgical History:  Procedure Laterality Date   BREAST BIOPSY Left 07/16/2022   [place] LT BREAST BX W LOC DEV 1ST LESION IMG BX SPEC US GUIDE 07/16/2022 Edwin Cap, MD [facility]   BREAST BIOPSY Right 07/26/2022   MM RT BREAST BX W LOC DEV 1ST LESION IMAGE BX SPEC STEREO GUIDE 07/26/2022 [facility]   BREAST LUMPECTOMY Left    EXTRACORPOREAL SHOCK WAVE LITHOTRIPSY Left 02/26/2018   Procedure: EXTRACORPOREAL SHOCK WAVE LITHOTRIPSY (ESWL);  Surgeon: Malen Gauze, MD;  Location: [facility];  Service: Urology;  Laterality: Left;   LUMPECTOM LEFT BREAST  02/2006   LYMPH NODE BIOPSY LEFT  04/2006   MASTECTOMY W/ SENTINEL NODE BIOPSY Left 08/15/2022   Procedure: LEFT SIMPLE MASTECTOMY, LEFT SENTINEL LYMPH NODE MAPPING;  Surgeon: Harriette Bouillon, MD;  Location: [facility];  Service: General;  Laterality: Left;   PORTACATH PLACEMENT N/A 10/09/2022   Procedure:  INSERTION PORT-A-CATH;  Surgeon: Harriette Bouillon, MD;  Location: [facility];  Service: General;  Laterality: N/A;   SIMPLE MASTECTOMY WITH AXILLARY SENTINEL NODE BIOPSY Right 08/15/2022   Procedure: RIGHT RISK REDUCING MASTECTOMY;  Surgeon: Harriette Bouillon, MD;  Location: [facility];  Service: General;  Laterality: Right;    Social History   Socioeconomic History   Marital status: Married    Spouse name: Not on file   Number of children: Not on file   Years of education: Not on file   Highest education level: Some college, no degree  Occupational History   Not on file  Tobacco Use   Smoking status: Never   Smokeless tobacco: Never  Vaping Use   Vaping status: Never Used  Substance and Sexual Activity   Alcohol use: No   Drug use: No   Sexual activity: Yes    Birth control/protection: Post-menopausal  Other Topics Concern    Not on file  Social History Narrative   Not on file   Social Drivers of Health   Financial Resource Strain: Patient Declined (10/16/2022)   Overall Financial Resource Strain (CARDIA)    Difficulty of Paying Living Expenses: Patient declined  Recent Concern: Physicist, medical Strain - High Risk (10/03/2022)   Overall Financial Resource Strain (CARDIA)    Difficulty of Paying Living Expenses: Hard  Food Insecurity: No Food Insecurity (11/27/2022)   Hunger Vital Sign    Worried About Running Out of Food in the Last Year: Never true    Ran Out of Food in the Last Year: Never true  Transportation Needs: No Transportation Needs (11/27/2022)   PRAPARE - Administrator, Civil Service (Medical): No    Lack of Transportation (Non-Medical): No  Physical Activity: Insufficiently Active (10/16/2022)   Exercise Vital Sign    Days of Exercise per Week: 1 day    Minutes of Exercise per Session: 20 min  Stress: No Stress Concern Present (10/16/2022)   [employer] of Occupational Health - Occupational Stress Questionnaire    Feeling of Stress : Not at all  Social Connections: Unknown (10/16/2022)   Social Connection and Isolation Panel [NHANES]    Frequency of Communication with Friends and Family: Patient declined    Frequency of Social Gatherings with Friends and Family: Patient declined    Attends Religious Services: More than 4 times per year    Active Member of [employer] or Organizations: Yes    Attends Engineer, structural: More than 4 times per year    Marital Status: Married  Catering manager Violence: Not At Risk (11/27/2022)   Humiliation, Afraid, Rape, and Kick questionnaire    Fear of Current or Ex-Partner: No    Emotionally Abused: No    Physically Abused: No    Sexually Abused: No    Family History  Problem Relation Age of Onset   Hypertension Mother    Hypertension Father    Heart disease Father 44       MI in his 21s   Cancer Maternal Aunt         unk type, possibly hip. dx >50   Breast cancer Paternal Aunt        dx 44s   Cancer Paternal Aunt        unknown, d. 9s   Prostate cancer Cousin        dx 3s-50s   Stomach cancer Cousin        dx 40s-50s  Current Outpatient Medications:    aluminum-magnesium hydroxide 200-200 MG/5ML suspension, Take 15 mLs by mouth every 6 (six) hours as needed for indigestion. Mix 1:1 with Lidocaine and swish and spit 4 times a day (Patient not taking: Reported on 03/25/2023), Disp: 355 mL, Rfl: 2   Bioflavonoid Products (GRAPE SEED PO), Take 1 capsule by mouth daily. (Patient not taking: Reported on 03/25/2023), Disp: , Rfl:    Calcium Carbonate Antacid (TUMS PO), Take 1 tablet by mouth as needed. Reported on 05/22/2015 (Patient not taking: Reported on 03/25/2023), Disp: , Rfl:    CARBOPLATIN IV, Inject into the vein every 21 ( twenty-one) days. (Patient not taking: Reported on 03/25/2023), Disp: , Rfl:    cephALEXin (KEFLEX) 500 MG capsule, Take 1 capsule (500 mg total) by mouth 4 (four) times daily. (Patient not taking: Reported on 03/25/2023), Disp: 40 capsule, Rfl: 0   Cholecalciferol (VITAMIN D PO), Take 1,000 Units by mouth daily. (Patient not taking: Reported on 03/25/2023), Disp: , Rfl:    diphenoxylate-atropine (LOMOTIL) 2.5-0.025 MG tablet, Take 1 tablet by mouth 4 (four) times daily as needed for diarrhea or loose stools. (Patient not taking: Reported on 03/25/2023), Disp: 60 tablet, Rfl: 0   DOCETAXEL IV, Inject into the vein every 21 ( twenty-one) days. (Patient not taking: Reported on 03/25/2023), Disp: , Rfl:    lidocaine (XYLOCAINE) 2 % solution, Use as directed 15 mLs in the mouth or throat 4 (four) times daily. Mix 1:1 with maalox and swish and spit 4 times a day (Patient not taking: Reported on 03/25/2023), Disp: 100 mL, Rfl: 2   lidocaine-prilocaine (EMLA) cream, Apply a quarter-sized amount to port a cath site and cover with plastic wrap 1 hour prior to infusion appointments (Patient not  taking: Reported on 03/25/2023), Disp: 30 g, Rfl: 3   loratadine (CLARITIN) 5 MG chewable tablet, Chew 5 mg by mouth daily. (Patient not taking: Reported on 03/25/2023), Disp: , Rfl:    montelukast (SINGULAIR) 10 MG tablet, Take one 10mg  tab for 2 days before chemo (day 1 and day 2) (Patient not taking: Reported on 03/25/2023), Disp: 20 tablet, Rfl: 0   Multiple Vitamin (MULTI-VITAMIN) tablet, Take 1 tablet by mouth daily., Disp: , Rfl:    nitrofurantoin, macrocrystal-monohydrate, (MACROBID) 100 MG capsule, Take 1 capsule (100 mg total) by mouth 2 (two) times daily. (Patient not taking: Reported on 03/25/2023), Disp: 10 capsule, Rfl: 0   ondansetron (ZOFRAN) 8 MG tablet, Take 1 tablet (8 mg total) by mouth every 8 (eight) hours as needed for nausea or vomiting. (Patient not taking: Reported on 03/25/2023), Disp: 20 tablet, Rfl: 0   PERTUZUMAB IV, Inject into the vein every 21 ( twenty-one) days. (Patient not taking: Reported on 03/25/2023), Disp: , Rfl:    predniSONE (DELTASONE) 20 MG tablet, Take one 20mg  tab for 2 days before chemo (day 1 and day 2) (Patient not taking: Reported on 03/25/2023), Disp: 20 tablet, Rfl: 0   TRASTUZUMAB IV, Inject into the vein every 21 ( twenty-one) days. (Patient not taking: Reported on 03/25/2023), Disp: , Rfl:  No current facility-administered medications for this visit.  Facility-Administered Medications Ordered in Other Visits:    0.9 %  sodium chloride infusion, , Intravenous, Continuous, Creig Hines, MD, Last Rate: 10 mL/hr at 04/23/23 1034, New Bag at 04/23/23 1034  Physical exam:  Vitals:   04/23/23 0931  BP: (!) 122/58  Pulse: 76  Resp: 18  Temp: 98.7 F (37.1 C)  TempSrc: Tympanic  SpO2: 100%  Weight: 90 lb 11.2 oz (41.1 kg)  Height: 5\' 3"  (1.6 m)   Physical Exam Cardiovascular:     Rate and Rhythm: Normal rate and regular rhythm.     Heart sounds: Normal heart sounds.  Pulmonary:     Effort: Pulmonary effort is normal.     Breath sounds: Normal  breath sounds.  Abdominal:     General: Bowel sounds are normal.     Palpations: Abdomen is soft.     Comments: Laparoscopy scars are healing well  Skin:    General: Skin is warm and dry.  Neurological:     Mental Status: She is alert and oriented to person, place, and time.      I have personally reviewed labs listed below:    Latest Ref Rng & Units 03/25/2023    8:28 AM  CMP  Glucose 70 - 99 mg/dL 782   BUN 6 - 20 mg/dL 19   Creatinine 9.56 - 1.00 mg/dL 2.13   Sodium 086 - 578 mmol/L 139   Potassium 3.5 - 5.1 mmol/L 3.7   Chloride 98 - 111 mmol/L 104   CO2 22 - 32 mmol/L 28   Calcium 8.9 - 10.3 mg/dL 9.1   Total Protein 6.5 - 8.1 g/dL 6.2   Total Bilirubin 0.0 - 1.2 mg/dL 0.6   Alkaline Phos 38 - 126 U/L 47   AST 15 - 41 U/L 16   ALT 0 - 44 U/L 15       Latest Ref Rng & Units 03/25/2023    8:28 AM  CBC  WBC 4.0 - 10.5 K/uL 4.0   Hemoglobin 12.0 - 15.0 g/dL 46.9   Hematocrit 62.9 - 46.0 % 36.0   Platelets 150 - 400 K/uL 154      Assessment and plan- Patient is a 58 y.o. female with history of BRCA2 mutation and second left breast cancer stage Ib MP T2 N0 versus pT1 cN1 M0 ER/PR positive HER2 positive.  She is here for on treatment assessment prior to cycle 3 of maintenance Herceptin and Perjeta  Okay to proceed with cycle 3 of Herceptin and Perjeta today.  I will see her back in 3 weeks for cycle 4.  She had a recent echocardiogram which showed normal EF of 67%.  Patient had resection of the left ovarian mass along with hysterectomy and bilateral salpingo-oophorectomy.  Left ovarian mass pathology showed cellular fibroma but no evidence of malignancy.  Patient will be meeting radiation oncology soon to start adjuvant radiation therapy.  I will plan to start endocrine therapy given that she has ER positive disease upon completion of radiation   Visit Diagnosis 1. Malignant neoplasm of upper-outer quadrant of left breast in female, estrogen receptor positive (HCC)    2. Encounter for monoclonal antibody treatment for malignancy      Dr. Owens Shark, MD, MPH [facility] [phone] 04/23/2023 1:04 PM

## 2023-05-07 ENCOUNTER — Encounter: Payer: Self-pay | Admitting: Oncology

## 2023-05-09 ENCOUNTER — Encounter: Payer: Self-pay | Admitting: Oncology

## 2023-05-12 ENCOUNTER — Encounter: Payer: Self-pay | Admitting: Oncology

## 2023-05-14 ENCOUNTER — Inpatient Hospital Stay

## 2023-05-14 ENCOUNTER — Encounter: Payer: Self-pay | Admitting: Oncology

## 2023-05-14 ENCOUNTER — Ambulatory Visit
Admission: RE | Admit: 2023-05-14 | Discharge: 2023-05-14 | Disposition: A | Discharge status: Home / Self Care | Source: Ambulatory Visit | Attending: Radiation Oncology | Admitting: Radiation Oncology

## 2023-05-14 ENCOUNTER — Inpatient Hospital Stay (HOSPITAL_BASED_OUTPATIENT_CLINIC_OR_DEPARTMENT_OTHER): Admitting: Oncology

## 2023-05-14 VITALS — BP 134/71 | HR 94 | Temp 97.1°F | Resp 16 | Wt 90.0 lb

## 2023-05-14 VITALS — BP 126/62 | HR 78

## 2023-05-14 DIAGNOSIS — C50412 Malignant neoplasm of upper-outer quadrant of left female breast: Secondary | ICD-10-CM

## 2023-05-14 DIAGNOSIS — K521 Toxic gastroenteritis and colitis: Secondary | ICD-10-CM | POA: Diagnosis not present

## 2023-05-14 DIAGNOSIS — Z5112 Encounter for antineoplastic immunotherapy: Secondary | ICD-10-CM

## 2023-05-14 DIAGNOSIS — Z170 Estrogen receptor positive status [ER+]: Secondary | ICD-10-CM

## 2023-05-14 LAB — CBC WITH DIFFERENTIAL/PLATELET
Abs Immature Granulocytes: 0.02 10*3/uL (ref 0.00–0.07)
Basophils Absolute: 0 10*3/uL (ref 0.0–0.1)
Basophils Relative: 0 %
Eosinophils Absolute: 0.1 10*3/uL (ref 0.0–0.5)
Eosinophils Relative: 2 %
HCT: 38.1 % (ref 36.0–46.0)
Hemoglobin: 12.7 g/dL (ref 12.0–15.0)
Immature Granulocytes: 0 %
Lymphocytes Relative: 27 %
Lymphs Abs: 1.3 10*3/uL (ref 0.7–4.0)
MCH: 30 pg (ref 26.0–34.0)
MCHC: 33.3 g/dL (ref 30.0–36.0)
MCV: 90.1 fL (ref 80.0–100.0)
Monocytes Absolute: 0.4 10*3/uL (ref 0.1–1.0)
Monocytes Relative: 8 %
Neutro Abs: 3 10*3/uL (ref 1.7–7.7)
Neutrophils Relative %: 63 %
Platelets: 167 10*3/uL (ref 150–400)
RBC: 4.23 MIL/uL (ref 3.87–5.11)
RDW: 12.8 % (ref 11.5–15.5)
WBC: 4.7 10*3/uL (ref 4.0–10.5)
nRBC: 0 % (ref 0.0–0.2)

## 2023-05-14 LAB — CMP (CANCER CENTER ONLY)
ALT: 21 U/L (ref 0–44)
AST: 20 U/L (ref 15–41)
Albumin: 4.2 g/dL (ref 3.5–5.0)
Alkaline Phosphatase: 58 U/L (ref 38–126)
Anion gap: 8 (ref 5–15)
BUN: 15 mg/dL (ref 6–20)
CO2: 27 mmol/L (ref 22–32)
Calcium: 9.4 mg/dL (ref 8.9–10.3)
Chloride: 103 mmol/L (ref 98–111)
Creatinine: 0.76 mg/dL (ref 0.44–1.00)
GFR, Estimated: >60 mL/min (ref >60)
Glucose, Bld: 102 mg/dL — ABNORMAL HIGH (ref 70–99)
Potassium: 3.8 mmol/L (ref 3.5–5.1)
Sodium: 138 mmol/L (ref 135–145)
Total Bilirubin: 0.4 mg/dL (ref 0.0–1.2)
Total Protein: 6.5 g/dL (ref 6.5–8.1)

## 2023-05-14 MED ORDER — HEPARIN SOD (PORK) LOCK FLUSH 100 UNIT/ML IV SOLN
500.0000 [IU] | Freq: Once | INTRAVENOUS | Status: AC | PRN
  Administered 2023-05-14: 500 [IU]
  Filled 2023-05-14: qty 5

## 2023-05-14 MED ORDER — ACETAMINOPHEN 325 MG PO TABS
650.0000 mg | ORAL_TABLET | Freq: Once | ORAL | Status: AC
  Administered 2023-05-14: 650 mg via ORAL
  Filled 2023-05-14: qty 2

## 2023-05-14 MED ORDER — METHYLPREDNISOLONE SODIUM SUCC 125 MG IJ SOLR
125.0000 mg | Freq: Once | INTRAMUSCULAR | Status: AC
  Administered 2023-05-14: 125 mg via INTRAVENOUS
  Filled 2023-05-14: qty 2

## 2023-05-14 MED ORDER — SODIUM CHLORIDE 0.9 % IV SOLN
INTRAVENOUS | Status: DC
  Filled 2023-05-14: qty 250

## 2023-05-14 MED ORDER — SODIUM CHLORIDE 0.9 % IV SOLN
6.0000 mg/kg | Freq: Once | INTRAVENOUS | Status: AC
  Administered 2023-05-14: 252 mg via INTRAVENOUS
  Filled 2023-05-14: qty 12

## 2023-05-14 MED ORDER — CETIRIZINE HCL 10 MG/ML IV SOLN
10.0000 mg | Freq: Once | INTRAVENOUS | Status: AC
  Administered 2023-05-14: 10 mg via INTRAVENOUS
  Filled 2023-05-14: qty 1

## 2023-05-14 MED ORDER — FAMOTIDINE IN NACL 20-0.9 MG/50ML-% IV SOLN
20.0000 mg | Freq: Once | INTRAVENOUS | Status: AC
  Administered 2023-05-14: 20 mg via INTRAVENOUS
  Filled 2023-05-14: qty 50

## 2023-05-14 NOTE — Progress Notes (Signed)
 Radiation Oncology Follow up Note  Name: Melissa Rodriguez   Date:   05/14/2023 MRN:  [id number] DOB: Jan 17, 1965    This 58 y.o. female presents to the clinic today for follow-up.  For new triple positive invasive mammary carcinoma of the left breast inpatient treatment treated back in 2008 for a 7 mm stage I ER/PR positive left breast cancer  REFERRING PROVIDER: Bennet Brasil, MD  HPI: Patient is a 58 year old female treated back in 2008 for 7 mm stage I ER/PR positive HER2 negative left breast cancer she underwent lumpectomy and adjuvant radiation therapy although declined endocrine therapy.  She now presented with a palpated mass in the left breast mammogram showed a 2 cm irregular hypoechoic mass 1 o'clock position 7 cm from the nipple.  Tumor was ER/PR positive and HER2/neu overexpressed.  She on genetic testing was BRCA2 mutation positive.  She underwent bilateral mastectomies no malignancy in right breast.  There were 2 separate masses in the left breast the largest measuring 2.4 x 2.3 x 1.6 cm.  There is a separate 1 cm mass thought to be a sentinel node although there was no lymphoid tissue on pathology.  For the large mass margins were clear although the smaller mass margin was positive posteriorly.  There is also DCIS present and margin was close at 0.1 mm there were no sentinel lymph nodes submitted.  Again tumor was ER/PR positive HER2/neu overexpressed.  Patient has been receiving TCHP chemotherapy.  They did find an 8 cm lobulated left ovarian mass which was resected at [facility] and final pathology showed no evidence of malignancy just a cellular fibroma.  She has completed cycle 9 of adjuvant Herceptin  and Perjeta .  She she has been having some GI issues and Perjeta  has been on hold.  She is seen today for consideration of left chest wall peripheral lymphatic radiation based on the poor prognostic factors including 2 separate masses no lymph nodes sampled and triple positive tumor.  She is  otherwise doing well specifically [place] chest wall tenderness cough or bone pain.  COMPLICATIONS OF TREATMENT: none  FOLLOW UP COMPLIANCE: keeps appointments   PHYSICAL EXAM:  LMP 07/05/2012  Patient status post bilateral mastectomies chest walls are clear.  No mass or nodularity in chest walls are noted.  No axillary or supraclavicular adenopathy is identified.  Well-developed well-nourished patient in NAD. HEENT reveals PERLA, EOMI, discs not visualized.  Oral cavity is clear. No oral mucosal lesions are identified. Neck is clear without evidence of cervical or supraclavicular adenopathy. Lungs are clear to A&P. Cardiac examination is essentially unremarkable with regular rate and rhythm without murmur rub or thrill. Abdomen is benign with no organomegaly or masses noted. Motor sensory and DTR levels are equal and symmetric in the upper and lower extremities. Cranial nerves II through XII are grossly intact. Proprioception is intact. No peripheral adenopathy or edema is identified. No motor or sensory levels are noted. Crude visual fields are within normal range.  RADIOLOGY RESULTS: CT scans of chest abdomen pelvis reviewed compatible with above-stated findings  PLAN: This time elect go ahead with chest wall and peripheral lymphatic radiation therapy would plan delivering 50 Gray over 5 weeks to both areas boosting her scar another 1000 cGy using electron based on her close posterior margin.  We used deep inspiration breath-hold technique based on her prior radiation.  And our attempt to spare the heart from as much radiation exposure as possible.  Risks and benefits of treatment also include possible  development of lymphedema although low risk.  Skin reaction fatigue alteration of blood counts possible inclusion of superficial lung all were discussed in detail with the patient.  I personally set up and ordered CT simulation for next week.  She comprehends my recommendations well.  I would like to  take this opportunity to thank you for allowing me to participate in the care of your patient.Glenis Langdon, MD

## 2023-05-14 NOTE — Progress Notes (Signed)
 Nutrition Follow-up:  Patient with left breast cancer, triple positive.  Completed TCHP chemotherapy.  Receiving herceptin  only today, holding perjeta . S/p surgery, no malignancy.  Met with patient during infusion.  Reports that stomach has been gassy.  Able to tolerate cheese and chicken.  The last week before coming to treatment able to eat more variety of foods.  Able to tolerate cottage cheese and some fruit, cheese stick and crackers, cashew butter, green beans, steak, rice, potatoes, zucchini.      Medications: reviewed  Labs: reviewed  Anthropometrics:   Weight 90 lb today, stable  92 lb on 3/11 92 lb 3.2 oz on 3/5 86 lb on 1/20 92 lb on 12/9 91 lb 4.8 oz on 11/20  Goal is to gain to 110 lb   NUTRITION DIAGNOSIS:  Inadequate oral intake improving    INTERVENTION:  Discussed trying cottage cheese and cheese quesadilla.   Continue to push high calories and protein to meet goal of weight gain    MONITORING, EVALUATION, GOAL: weight trends, intake   NEXT VISIT: during treatment  Marylynn Rigdon B. Zollie Hipp, CSO, LDN Registered Dietitian [phone]

## 2023-05-14 NOTE — Patient Instructions (Signed)
 [facility] - A DEPT OF [facility]  Discharge Instructions: Thank you for choosing [facility] to provide your oncology and hematology care.  If you have a lab appointment with the Cancer Center, please go directly to the Cancer Center and check in at the registration area.  Wear comfortable clothing and clothing appropriate for easy access to any Portacath or PICC line.   We strive to give you quality time with your provider. You may need to reschedule your appointment if you arrive late (15 or more minutes).  Arriving late affects you and other patients whose appointments are after yours.  Also, if you miss three or more appointments without notifying the office, you may be dismissed from the clinic at the provider's discretion.      For prescription refill requests, have your pharmacy contact our office and allow 72 hours for refills to be completed.    Today you received the following chemotherapy and/or immunotherapy agents Kanjinti      To help prevent nausea and vomiting after your treatment, we encourage you to take your nausea medication as directed.  BELOW ARE SYMPTOMS THAT SHOULD BE REPORTED IMMEDIATELY: *FEVER GREATER THAN 100.4 F (38 C) OR HIGHER *CHILLS OR SWEATING *NAUSEA AND VOMITING THAT IS NOT CONTROLLED WITH YOUR NAUSEA MEDICATION *UNUSUAL SHORTNESS OF BREATH *UNUSUAL BRUISING OR BLEEDING *URINARY PROBLEMS (pain or burning when urinating, or frequent urination) *BOWEL PROBLEMS (unusual diarrhea, constipation, pain near the anus) TENDERNESS IN MOUTH AND THROAT WITH OR WITHOUT PRESENCE OF ULCERS (sore throat, sores in mouth, or a toothache) UNUSUAL RASH, SWELLING OR PAIN  UNUSUAL VAGINAL DISCHARGE OR ITCHING   Items with * indicate a potential emergency and should be followed up as soon as possible or go to the Emergency Department if any problems should occur.  Please show the CHEMOTHERAPY ALERT CARD or IMMUNOTHERAPY  ALERT CARD at check-in to the Emergency Department and triage nurse.  Should you have questions after your visit or need to cancel or reschedule your appointment, please contact [facility] - A DEPT OF [facility]  [phone] and follow the prompts.  Office hours are 8:00 a.m. to 4:30 p.m. Monday - Friday. Please note that voicemails left after 4:00 p.m. may not be returned until the following business day.  We are closed weekends and major holidays. You have access to a nurse at all times for urgent questions. Please call the main number to the clinic [phone] and follow the prompts.  For any non-urgent questions, you may also contact your provider using MyChart. We now offer e-Visits for anyone 3 and older to request care online for non-urgent symptoms. For details visit [email or web address].   Also download the MyChart app! Go to the app store, search "MyChart", open the app, select [place], and log in with your MyChart username and password.

## 2023-05-14 NOTE — Progress Notes (Signed)
 Hematology/Oncology Consult note [facility]  [phone] [phone]  Patient Care Team: Bennet Brasil, MD as PCP - General (Family Medicine) Paulett Boros, MD as Medical Oncologist (Medical Oncology) Gerhard Knuckles, RN as Oncology Nurse Navigator (Medical Oncology) Avonne Boettcher, MD as Consulting Physician (Oncology) Rochell Chroman, RN as Oncology Nurse Navigator Glenis Langdon, MD as Consulting Physician (Radiation Oncology)   Name of the patient: Melissa Rodriguez  [id number]  08-08-1965   Date of visit: 05/14/23  Diagnosis-  pathologic prognostic stage Ib invasive mammary carcinoma of the left breast T2 N1 M0 ER/PR positive HER2 positive   Chief complaint/ Reason for visit-on treatment assessment prior to cycle 9 of adjuvant Herceptin  and Perjeta   Heme/Onc history: Patient is a 58 year old female with a prior history of lumpectomy in 2008 which showed a 7 mm stage I ER/PR positive HER2 negative left breast cancer.  She underwent lumpectomy and adjuvant radiation therapy but did not go through endocrine therapy.   She self palpated a left breast lump led to a diagnostic mammogram in July 2024.  Diagnostic mammogram showed 1.9 cm irregular hypoechoic mass 1 o'clock position 7 cm from the breast grade 3 ER 99% positive, PR 85% positive and HER2 equivocal by IHC and FISH positive.  Ki-67 35% there were calcifications noted in the right breast as well which were biopsy-proven benign.     She underwent genetic testing and was found to have BRCA2 mutation   Patient was seen by Dr. Cheree Cords and he recommended upfront surgery and consideration for adjuvant chemotherapy.  Patient underwent bilateral mastectomy on 08/15/2022.  No evidence of malignancy in the right breast.  There were 2 distinct masses noted in the left breast with dominant mass measured 2.4 x 2.3 x 1.6 cm grade 3 with negative margins.  There was another 1 cm mass where  there was uptake of radiotracer dye with positive posterior lateral anterior margin.  This was believed to be the sentinel lymph node although on pathology it states that there were no lymph nodes identified likely due to prior lumpectomy. mpT2.  She was staged as T2 N1 triple positive disease.   She was recommended adjuvant TCHP chemotherapy and received cycle 1 on 10/14/2022. Patient found to have 7.9 cm lobulated left ovarian mass which was resected at [facility] in March 2025.  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  No pathologic diagnosis was noted in the uterus contralateral ovary and fallopian tube.      Interval history-patient feels fatigued after her treatments and her bowel movements are erratic sometimes constipation and sometimes diarrhea.  She finds it difficult to eat foods which will not bother her stomach further.  On and off abdominal pain.  States that she is unable to gain weight because of her present regimen of treatment  ECOG PS- 1 Pain scale- 2   Review of systems- Review of Systems  Constitutional:  Positive for malaise/fatigue. Negative for chills, fever and weight loss.  HENT:  Negative for congestion, ear discharge and nosebleeds.   Eyes:  Negative for blurred vision.  Respiratory:  Negative for cough, hemoptysis, sputum production, shortness of breath and wheezing.   Cardiovascular:  Negative for chest pain, palpitations, orthopnea and claudication.  Gastrointestinal:  Positive for constipation and diarrhea. Negative for abdominal pain, blood in stool, heartburn, melena, nausea and vomiting.  Genitourinary:  Negative for dysuria, flank pain, frequency, hematuria and urgency.  Musculoskeletal:  Negative for back pain, joint pain and myalgias.  Skin:  Negative for rash.  Neurological:  Negative for dizziness, tingling, focal weakness, seizures, weakness and headaches.  Endo/Heme/Allergies:  Does not bruise/bleed  easily.  Psychiatric/Behavioral:  Negative for depression and suicidal ideas. The patient does not have insomnia.       Allergies  Allergen Reactions   Benadryl  [Diphenhydramine  Hcl]     Jittery and Hallucinations   Diphenhydramine  Hcl Anxiety    Other Reaction(s): Hallucination  Jittery and Hallucinations    Made her feel like she was crawling the walls.   Hydrocodone  Shortness Of Breath    Felt like she could not breathe-February 2020   Penicillins Hives and Rash    Did it involve swelling of the face/tongue/throat, SOB, or low BP? No  Did it involve sudden or severe rash/hives, skin peeling, or any reaction on the inside of your mouth or nose? No  Did you need to seek medical attention at a hospital or doctor's office? No  When did it last happen?        If all above answers are "NO", may proceed with cephalosporin use.  Any medicine related to PCN    Did it involve swelling of the face/tongue/throat, SOB, or low BP? No Did it involve sudden or severe rash/hives, skin peeling, or any reaction on the inside of your mouth or nose? No Did you need to seek medical attention at a hospital or doctor's office? No When did it last happen?       If all above answers are "NO", may proceed with cephalosporin use.   Ciprofloxacin Hives   Docetaxel  Other (See Comments)    Tingling of lips, teeth hurting, hot tongue   Trastuzumab -Anns [Trastuzumab -Pkrb] Itching    Tingling and hot tongue and lips     Past Medical History:  Diagnosis Date   Adenocarcinoma of breast (HCC) 05/20/2011   Stage I (T1b N0), grade 1, well-differentiated adenocarcinoma of the medial lower quadrant of the left breast. It was a tubular carcinoma, status post lumpectomy on 03/13/2006, ER positive 79%, PR positive 92%, HER2/neu negative, Ki-67 marker low at 18%. Her cancer was 7 mm in size, and she had 4 sentinel nodes all negative along with her lumpectomy. She was treated with radiation therapy postoper    Complication of anesthesia    pt states her bladder did not wake up after one surgery. Went to ER, had a urinary catheter for 1 week   History of kidney stones    Iron deficiency    Iron deficiency anemia 05/20/2011   Secondary to GU bleeding.  Good absorber of PO iron   Personal history of radiation therapy      Past Surgical History:  Procedure Laterality Date   BREAST BIOPSY Left 07/16/2022   US  LT BREAST BX W LOC DEV 1ST LESION IMG BX SPEC US  GUIDE 07/16/2022 Alger Infield, MD [facility]   BREAST BIOPSY Right 07/26/2022   MM RT BREAST BX W LOC DEV 1ST LESION IMAGE BX SPEC STEREO GUIDE 07/26/2022 [facility]   BREAST LUMPECTOMY Left    EXTRACORPOREAL SHOCK WAVE LITHOTRIPSY Left 02/26/2018   Procedure: EXTRACORPOREAL SHOCK WAVE LITHOTRIPSY (ESWL);  Surgeon: Marco Severs, MD;  Location: [facility];  Service: Urology;  Laterality: Left;   LUMPECTOM LEFT BREAST  02/2006   LYMPH NODE BIOPSY LEFT  04/2006   MASTECTOMY W/ SENTINEL NODE BIOPSY Left 08/15/2022   Procedure: LEFT SIMPLE MASTECTOMY, LEFT SENTINEL LYMPH NODE MAPPING;  Surgeon: Sim Dryer, MD;  Location: [facility];  Service: General;  Laterality: Left;   PORTACATH PLACEMENT N/A 10/09/2022   Procedure: INSERTION PORT-A-CATH;  Surgeon: Sim Dryer, MD;  Location: [facility];  Service: General;  Laterality: N/A;   SIMPLE MASTECTOMY WITH AXILLARY SENTINEL NODE BIOPSY Right 08/15/2022   Procedure: RIGHT RISK REDUCING MASTECTOMY;  Surgeon: Sim Dryer, MD;  Location: [facility];  Service: General;  Laterality: Right;    Social History   Socioeconomic History   Marital status: Married    Spouse name: Not on file   Number of children: Not on file   Years of education: Not on file   Highest education level: Some college, no degree  Occupational History   Not on file  Tobacco Use   Smoking status: Never   Smokeless tobacco: Never  Vaping Use   Vaping status: Never  Used  Substance and Sexual Activity   Alcohol use: No   Drug use: No   Sexual activity: Yes    Birth control/protection: Post-menopausal  Other Topics Concern   Not on file  Social History Narrative   Not on file   Social Drivers of Health   Financial Resource Strain: Patient Declined (10/16/2022)   Overall Financial Resource Strain (CARDIA)    Difficulty of Paying Living Expenses: Patient declined  Recent Concern: Physicist, medical Strain - High Risk (10/03/2022)   Overall Financial Resource Strain (CARDIA)    Difficulty of Paying Living Expenses: Hard  Food Insecurity: No Food Insecurity (11/27/2022)   Hunger Vital Sign    Worried About Running Out of Food in the Last Year: Never true    Ran Out of Food in the Last Year: Never true  Transportation Needs: No Transportation Needs (11/27/2022)   PRAPARE - Administrator, Civil Service (Medical): No    Lack of Transportation (Non-Medical): No  Physical Activity: Insufficiently Active (10/16/2022)   Exercise Vital Sign    Days of Exercise per Week: 1 day    Minutes of Exercise per Session: 20 min  Stress: No Stress Concern Present (10/16/2022)   [employer] of Occupational Health - Occupational Stress Questionnaire    Feeling of Stress : Not at all  Social Connections: Unknown (10/16/2022)   Social Connection and Isolation Panel [NHANES]    Frequency of Communication with Friends and Family: Patient declined    Frequency of Social Gatherings with Friends and Family: Patient declined    Attends Religious Services: More than 4 times per year    Active Member of [employer] or Organizations: Yes    Attends Engineer, structural: More than 4 times per year    Marital Status: Married  Catering manager Violence: Not At Risk (11/27/2022)   Humiliation, Afraid, Rape, and Kick questionnaire    Fear of Current or Ex-Partner: No    Emotionally Abused: No    Physically Abused: No    Sexually Abused: No    Family  History  Problem Relation Age of Onset   Hypertension Mother    Hypertension Father    Heart disease Father 42       MI in his 17s   Cancer Maternal Aunt        unk type, possibly hip. dx >50   Breast cancer Paternal Aunt        dx 43s   Cancer Paternal Aunt        unknown, d. 72s   Prostate cancer Cousin  dx 40s-50s   Stomach cancer Cousin        dx 40s-50s     Current Outpatient Medications:    aluminum -magnesium  hydroxide 200-200 MG/5ML suspension, Take 15 mLs by mouth every 6 (six) hours as needed for indigestion. Mix 1:1 with Lidocaine  and swish and spit 4 times a day (Patient not taking: Reported on 05/14/2023), Disp: 355 mL, Rfl: 2   Bioflavonoid Products (GRAPE SEED PO), Take 1 capsule by mouth daily. (Patient not taking: Reported on 03/25/2023), Disp: , Rfl:    Calcium Carbonate Antacid (TUMS PO), Take 1 tablet by mouth as needed. Reported on 05/22/2015 (Patient not taking: Reported on 03/25/2023), Disp: , Rfl:    CARBOPLATIN  IV, Inject into the vein every 21 ( twenty-one) days. (Patient not taking: Reported on 05/14/2023), Disp: , Rfl:    cephALEXin  (KEFLEX ) 500 MG capsule, Take 1 capsule (500 mg total) by mouth 4 (four) times daily. (Patient not taking: Reported on 05/14/2023), Disp: 40 capsule, Rfl: 0   Cholecalciferol (VITAMIN D  PO), Take 1,000 Units by mouth daily. (Patient not taking: Reported on 03/25/2023), Disp: , Rfl:    diphenoxylate -atropine  (LOMOTIL ) 2.5-0.025 MG tablet, Take 1 tablet by mouth 4 (four) times daily as needed for diarrhea or loose stools. (Patient not taking: Reported on 05/14/2023), Disp: 60 tablet, Rfl: 0   DOCETAXEL  IV, Inject into the vein every 21 ( twenty-one) days. (Patient not taking: Reported on 05/14/2023), Disp: , Rfl:    lidocaine  (XYLOCAINE ) 2 % solution, Use as directed 15 mLs in the mouth or throat 4 (four) times daily. Mix 1:1 with maalox and swish and spit 4 times a day (Patient not taking: Reported on 05/14/2023), Disp: 100 mL, Rfl: 2    lidocaine -prilocaine  (EMLA ) cream, Apply a quarter-sized amount to port a cath site and cover with plastic wrap 1 hour prior to infusion appointments (Patient not taking: Reported on 05/14/2023), Disp: 30 g, Rfl: 3   loratadine  (CLARITIN ) 5 MG chewable tablet, Chew 5 mg by mouth daily. (Patient not taking: Reported on 05/14/2023), Disp: , Rfl:    montelukast  (SINGULAIR ) 10 MG tablet, Take one 10mg  tab for 2 days before chemo (day 1 and day 2) (Patient not taking: Reported on 05/14/2023), Disp: 20 tablet, Rfl: 0   Multiple Vitamin (MULTI-VITAMIN) tablet, Take 1 tablet by mouth daily. (Patient not taking: Reported on 05/14/2023), Disp: , Rfl:    nitrofurantoin , macrocrystal-monohydrate, (MACROBID ) 100 MG capsule, Take 1 capsule (100 mg total) by mouth 2 (two) times daily. (Patient not taking: Reported on 05/14/2023), Disp: 10 capsule, Rfl: 0   ondansetron  (ZOFRAN ) 8 MG tablet, Take 1 tablet (8 mg total) by mouth every 8 (eight) hours as needed for nausea or vomiting. (Patient not taking: Reported on 05/14/2023), Disp: 20 tablet, Rfl: 0   PERTUZUMAB  IV, Inject into the vein every 21 ( twenty-one) days. (Patient not taking: Reported on 05/14/2023), Disp: , Rfl:    predniSONE  (DELTASONE ) 20 MG tablet, Take one 20mg  tab for 2 days before chemo (day 1 and day 2) (Patient not taking: Reported on 05/14/2023), Disp: 20 tablet, Rfl: 0   TRASTUZUMAB  IV, Inject into the vein every 21 ( twenty-one) days. (Patient not taking: Reported on 05/14/2023), Disp: , Rfl:  No current facility-administered medications for this visit.  Facility-Administered Medications Ordered in Other Visits:    0.9 %  sodium chloride  infusion, , Intravenous, Continuous, Avonne Boettcher, MD, Last Rate: 10 mL/hr at 05/14/23 0938, New Bag at 05/14/23 0938   heparin  lock flush  100 unit/mL, 500 Units, Intracatheter, Once PRN, Avonne Boettcher, MD   trastuzumab -anns (KANJINTI ) 252 mg in sodium chloride  0.9 % 250 mL chemo infusion, 6 mg/kg (Treatment Plan  Recorded), Intravenous, Once, Avonne Boettcher, MD  Physical exam:  Vitals:   05/14/23 0902  BP: 134/71  Pulse: 94  Resp: 16  Temp: (!) 97.1 F (36.2 C)  TempSrc: Tympanic  SpO2: 100%  Weight: 90 lb (40.8 kg)   Physical Exam Cardiovascular:     Rate and Rhythm: Normal rate and regular rhythm.     Heart sounds: Normal heart sounds.  Pulmonary:     Effort: Pulmonary effort is normal.     Breath sounds: Normal breath sounds.  Abdominal:     General: Bowel sounds are normal.     Palpations: Abdomen is soft.  Skin:    General: Skin is warm and dry.  Neurological:     Mental Status: She is alert and oriented to person, place, and time.      I have personally reviewed labs listed below:    Latest Ref Rng & Units 05/14/2023    8:51 AM  CMP  Glucose 70 - 99 mg/dL 829   BUN 6 - 20 mg/dL 15   Creatinine 5.62 - 1.00 mg/dL 1.30   Sodium 865 - 784 mmol/L 138   Potassium 3.5 - 5.1 mmol/L 3.8   Chloride 98 - 111 mmol/L 103   CO2 22 - 32 mmol/L 27   Calcium 8.9 - 10.3 mg/dL 9.4   Total Protein 6.5 - 8.1 g/dL 6.5   Total Bilirubin 0.0 - 1.2 mg/dL 0.4   Alkaline Phos 38 - 126 U/L 58   AST 15 - 41 U/L 20   ALT 0 - 44 U/L 21       Latest Ref Rng & Units 05/14/2023    8:51 AM  CBC  WBC 4.0 - 10.5 K/uL 4.7   Hemoglobin 12.0 - 15.0 g/dL 69.6   Hematocrit 29.5 - 46.0 % 38.1   Platelets 150 - 400 K/uL 167      Assessment and plan- Patient is a 58 y.o. female with history of BRCA2 mutation and second left breast cancer stage Ib MP T2 N0 versus pT1 cN1 M0 ER/PR positive HER2 positive.  She is here for on treatment assessment prior to cycle 9 of adjuvant Herceptin  and Perjeta   Given ongoing GI issues with her treatment I am holding off on giving her perjeta  today and only give her Herceptin .  We will see how she does with single agent therapy.  If she has significant improvement in her quality of life and abdominal symptoms we will continue with Herceptin  alone.  However if she does not  feel significantly different we will go back to doing both Herceptin  and Perjeta  in 3 weeks.  Plan is to complete up to 18 adjuvant cycles.   Patient is meeting with radiation oncology today and will be starting adjuvant radiation soon.  Upon completion of adjuvant radiation we will discuss endocrine therapy for ER positive breast cancer.    Visit Diagnosis 1. Malignant neoplasm of upper-outer quadrant of left breast in female, estrogen receptor positive (HCC)   2. Encounter for monoclonal antibody treatment for malignancy   3. Drug-induced diarrhea      Dr. Seretha Dance, MD, MPH [facility] [phone] 05/14/2023 10:31 AM

## 2023-05-19 ENCOUNTER — Ambulatory Visit
Admission: RE | Admit: 2023-05-19 | Discharge: 2023-05-19 | Disposition: A | Discharge status: Home / Self Care | Source: Ambulatory Visit | Attending: Radiation Oncology | Admitting: Radiation Oncology

## 2023-05-19 DIAGNOSIS — D509 Iron deficiency anemia, unspecified: Secondary | ICD-10-CM | POA: Diagnosis not present

## 2023-05-19 DIAGNOSIS — R1909 Other intra-abdominal and pelvic swelling, mass and lump: Secondary | ICD-10-CM | POA: Diagnosis not present

## 2023-05-19 DIAGNOSIS — Z1501 Genetic susceptibility to malignant neoplasm of breast: Secondary | ICD-10-CM | POA: Diagnosis not present

## 2023-05-19 DIAGNOSIS — Z170 Estrogen receptor positive status [ER+]: Secondary | ICD-10-CM | POA: Insufficient documentation

## 2023-05-19 DIAGNOSIS — Z87442 Personal history of urinary calculi: Secondary | ICD-10-CM | POA: Insufficient documentation

## 2023-05-19 DIAGNOSIS — C50412 Malignant neoplasm of upper-outer quadrant of left female breast: Secondary | ICD-10-CM | POA: Insufficient documentation

## 2023-05-21 ENCOUNTER — Encounter: Payer: Self-pay | Admitting: Obstetrics and Gynecology

## 2023-05-21 ENCOUNTER — Other Ambulatory Visit: Payer: Self-pay | Admitting: *Deleted

## 2023-05-21 ENCOUNTER — Inpatient Hospital Stay (HOSPITAL_BASED_OUTPATIENT_CLINIC_OR_DEPARTMENT_OTHER): Admitting: Obstetrics and Gynecology

## 2023-05-21 VITALS — BP 130/68 | HR 87 | Temp 98.6°F | Resp 20 | Wt 90.2 lb

## 2023-05-21 DIAGNOSIS — Z1501 Genetic susceptibility to malignant neoplasm of breast: Secondary | ICD-10-CM | POA: Insufficient documentation

## 2023-05-21 DIAGNOSIS — Z170 Estrogen receptor positive status [ER+]: Secondary | ICD-10-CM

## 2023-05-21 DIAGNOSIS — Z1721 Progesterone receptor positive status: Secondary | ICD-10-CM | POA: Insufficient documentation

## 2023-05-21 DIAGNOSIS — Z1502 Genetic susceptibility to malignant neoplasm of ovary: Secondary | ICD-10-CM

## 2023-05-21 DIAGNOSIS — Z90710 Acquired absence of both cervix and uterus: Secondary | ICD-10-CM | POA: Insufficient documentation

## 2023-05-21 DIAGNOSIS — C50412 Malignant neoplasm of upper-outer quadrant of left female breast: Secondary | ICD-10-CM | POA: Insufficient documentation

## 2023-05-21 DIAGNOSIS — Z1509 Genetic susceptibility to other malignant neoplasm: Secondary | ICD-10-CM

## 2023-05-21 DIAGNOSIS — Z9013 Acquired absence of bilateral breasts and nipples: Secondary | ICD-10-CM | POA: Insufficient documentation

## 2023-05-21 DIAGNOSIS — Z148 Genetic carrier of other disease: Secondary | ICD-10-CM

## 2023-05-21 DIAGNOSIS — D3912 Neoplasm of uncertain behavior of left ovary: Secondary | ICD-10-CM | POA: Insufficient documentation

## 2023-05-21 DIAGNOSIS — Z90722 Acquired absence of ovaries, bilateral: Secondary | ICD-10-CM | POA: Insufficient documentation

## 2023-05-21 DIAGNOSIS — Z5112 Encounter for antineoplastic immunotherapy: Secondary | ICD-10-CM | POA: Insufficient documentation

## 2023-05-21 DIAGNOSIS — Z800 Family history of malignant neoplasm of digestive organs: Secondary | ICD-10-CM | POA: Insufficient documentation

## 2023-05-21 NOTE — Progress Notes (Signed)
 Gynecologic Oncology Consult Visit   Referring Provider: Dr. Randy Buttery  Chief Complaint: Adnexal mass in BRCA2 mutation carrier, post op exam  Subjective:  PHUONG SELVAGGIO is a 58 y.o. female who is seen in consultation from Dr. Randy Buttery for pelvic mass, on active chemotherapy for concurrent breast cancer, who returns to clinic for follow up.   On 04/08/23 underwent TLH, BSO washings for ovarian mass and BRCA2 germline mutation.  Mass was an ovarian fibroma and pathology otherwise benign.  Returns to day for post op check.   A.  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 Negative for malignancy.  B. Uterus, right fallopian tube, and right ovary, total hysterectomy and salpingo-oophorectomy:  Uterus, 79 grams. Endometrium: Benign endometrial polyp (1.8 cm). Myometrium: Adenomyosis. Cervix: No pathologic diagnosis. Serosa: No pathologic diagnosis.  Fallopian tube: No pathologic diagnosis. Ovary: No pathologic diagnosis.  Note: The ovary and fallopian tube were entirely submitted for microscopic examination. The fallopian tube was processed according to the SEE-FIM protocol for patients at increased risk of adnexal cancer.  C. Left gutter peritoneum, biopsy:  Benign fibrous tissue.  D. Right gutter peritoneum, biopsy:  Benign fibrous tissue.   E. Omentum, omentectomy:   Benign fibroadipose tissue.   F. Left fallopian tube, salpingectomy:  Fallopian tube with no pathologic diagnosis.  Note: The fallopian tube was entirely submitted for microscopic examination. The fallopian tube was processed according to the SEE-FIM protocol for patients at increased risk of adnexal cancer.  Gynecologic Oncology History:  She has history of breast cancer, w/p lumpectomy then recurrence s/p mastectomy, now with new breast cancer and found to be brca2 mutation carrier, currently undergoing adjuvant chemotherapy after surgical excision with Dr. Randy Buttery.  She presented  to ER on 10/31/22 for pelvic pain and hematuria due to UTI. CT Abdomen Pelvis was performed which revealed heterogeneous mass in left adnexa measuring 8.5 cm. Ovaries not visualized. Endometrium measuring 5-6 mm. Positive for cystitis and ascending UTI. No hydronephrosis.   11/04/22- MRI Pelvis W WO Contrast IMPRESSION: 1. Large, lobulated left ovarian mass measuring 7.9 x 7.0 x 5.9 cm. This is heterogeneously T2 hypointense with irregular regions of internal T2 hyperintensity and poorly enhancing. 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 In retrospective review, this lesion was present on remote  prior CT dated 02/22/2018 and has significantly enlarged in size over the interval, demonstrating relatively indolent behavior. This is however nonetheless technically characterized as an O-RADS category 4 lesion and remains suspicious for solid malignancy. Recommend surgical referral. 2. Small volume free fluid throughout the pelvis. 3. No evidence of lymphadenopathy or metastatic disease in the pelvis.  Menopause in early 50s, no HRT CA 125 - 11/11/22 13.7   She has completed her 6th cycle of neoadjuvant carboplatin -taxotere -herceptin -perjeta  chemotherapy and started maintenance herceptin -perjeta  03/05/2023.   12/02/2022 Inhibin B192.2   CT scan demonstrated 8.8 cm left-sided complex mass and small amount of pelvic fluid as well as concerning findings for omental carcinomatosis.     Dr. Maida Sciara also spoke to her about axillary node radiation for adjuvant therapy and she had next cycle of maintenance herceptin -perjeta  on 03/25/2023.  03/17/23- CT C/A/P      Problem List: Patient Active Problem List   Diagnosis Date Noted   Drug-induced constipation 10/24/2022   Chemotherapy adverse reaction 10/24/2022   Genetic testing 08/07/2022   BRCA2 gene mutation positive 08/07/2022   Breast cancer of upper-outer quadrant of  left female breast (HCC) 07/31/2022   Thrombocytopenia (HCC) 05/22/2013   Adenocarcinoma of breast (HCC) 05/20/2011   Iron deficiency anemia 05/20/2011    Past Medical History: Past Medical History:  Diagnosis Date   Adenocarcinoma of breast (HCC) 05/20/2011   Stage I (T1b N0), grade 1, well-differentiated adenocarcinoma of the medial lower quadrant of the left breast. It was a tubular carcinoma, status post lumpectomy on 03/13/2006, ER positive 79%, PR positive 92%, HER2/neu negative, Ki-67 marker low at 18%. Her cancer was 7 mm in size, and she had 4 sentinel nodes all negative along with her lumpectomy. She was treated with radiation therapy postoper   Complication of anesthesia    pt states her bladder did not wake up after one surgery. Went to ER, had a urinary catheter for 1 week   History of kidney stones    Iron deficiency    Iron deficiency anemia 05/20/2011   Secondary to GU bleeding.  Good absorber of PO iron   Personal history of radiation therapy     Past Surgical History: Past Surgical History:  Procedure Laterality Date   BREAST BIOPSY Left 07/16/2022   US  LT BREAST BX W LOC DEV 1ST LESION IMG BX SPEC US  GUIDE 07/16/2022 Alger Infield, MD [facility]   BREAST BIOPSY Right 07/26/2022   MM RT BREAST BX W LOC DEV 1ST LESION IMAGE BX SPEC STEREO GUIDE 07/26/2022 [facility]   BREAST LUMPECTOMY Left    EXTRACORPOREAL SHOCK WAVE LITHOTRIPSY Left 02/26/2018   Procedure: EXTRACORPOREAL SHOCK WAVE LITHOTRIPSY (ESWL);  Surgeon: Marco Severs, MD;  Location: [facility];  Service: Urology;  Laterality: Left;   LUMPECTOM LEFT BREAST  02/2006   LYMPH NODE BIOPSY LEFT  04/2006   MASTECTOMY W/ SENTINEL NODE BIOPSY Left 08/15/2022   Procedure: LEFT SIMPLE MASTECTOMY, LEFT SENTINEL LYMPH NODE MAPPING;  Surgeon: Sim Dryer, MD;  Location: [facility];  Service: General;  Laterality: Left;   PORTACATH PLACEMENT N/A 10/09/2022   Procedure: INSERTION PORT-A-CATH;   Surgeon: Sim Dryer, MD;  Location: [facility];  Service: General;  Laterality: N/A;   SIMPLE MASTECTOMY WITH AXILLARY SENTINEL NODE BIOPSY Right 08/15/2022   Procedure: RIGHT RISK REDUCING MASTECTOMY;  Surgeon: Sim Dryer, MD;  Location: [facility];  Service: General;  Laterality: Right;    OB History:  OB History  Gravida Para Term Preterm AB Living  3 3 3   3   SAB IAB Ectopic Multiple Live Births          # Outcome Date GA Lbr Len/2nd Weight Sex Type Anes PTL Lv  3 Term           2 Term           1 Term             Family History: Family History  Problem Relation Age of Onset   Hypertension Mother    Hypertension Father    Heart disease Father 64       MI in his 25s   Cancer Maternal Aunt        unk type, possibly hip. dx >50   Breast cancer Paternal Aunt        dx 78s   Cancer Paternal Aunt        unknown, d. 58s   Prostate cancer Cousin        dx 31s-50s   Stomach cancer Cousin        dx 40s-50s  Social History: Social History   Socioeconomic History   Marital status: Married    Spouse name: Not on file   Number of children: Not on file   Years of education: Not on file   Highest education level: Some college, no degree  Occupational History   Not on file  Tobacco Use   Smoking status: Never   Smokeless tobacco: Never  Vaping Use   Vaping status: Never Used  Substance and Sexual Activity   Alcohol use: No   Drug use: No   Sexual activity: Yes    Birth control/protection: Post-menopausal  Other Topics Concern   Not on file  Social History Narrative   Not on file   Social Drivers of Health   Financial Resource Strain: Patient Declined (10/16/2022)   Overall Financial Resource Strain (CARDIA)    Difficulty of Paying Living Expenses: Patient declined  Recent Concern: Physicist, medical Strain - High Risk (10/03/2022)   Overall Financial Resource Strain (CARDIA)    Difficulty of Paying Living Expenses: Hard   Food Insecurity: No Food Insecurity (11/27/2022)   Hunger Vital Sign    Worried About Running Out of Food in the Last Year: Never true    Ran Out of Food in the Last Year: Never true  Transportation Needs: No Transportation Needs (11/27/2022)   PRAPARE - Administrator, Civil Service (Medical): No    Lack of Transportation (Non-Medical): No  Physical Activity: Insufficiently Active (10/16/2022)   Exercise Vital Sign    Days of Exercise per Week: 1 day    Minutes of Exercise per Session: 20 min  Stress: No Stress Concern Present (10/16/2022)   [employer] of Occupational Health - Occupational Stress Questionnaire    Feeling of Stress : Not at all  Social Connections: Unknown (10/16/2022)   Social Connection and Isolation Panel [NHANES]    Frequency of Communication with Friends and Family: Patient declined    Frequency of Social Gatherings with Friends and Family: Patient declined    Attends Religious Services: More than 4 times per year    Active Member of [employer] or Organizations: Yes    Attends Banker Meetings: More than 4 times per year    Marital Status: Married  Catering manager Violence: Not At Risk (11/27/2022)   Humiliation, Afraid, Rape, and Kick questionnaire    Fear of Current or Ex-Partner: No    Emotionally Abused: No    Physically Abused: No    Sexually Abused: No    Allergies: Allergies  Allergen Reactions   Benadryl  [Diphenhydramine  Hcl]     Jittery and Hallucinations   Diphenhydramine  Hcl Anxiety    Other Reaction(s): Hallucination  Jittery and Hallucinations    Made her feel like she was crawling the walls.   Hydrocodone  Shortness Of Breath    Felt like she could not breathe-February 2020   Penicillins Hives and Rash    Did it involve swelling of the face/tongue/throat, SOB, or low BP? No  Did it involve sudden or severe rash/hives, skin peeling, or any reaction on the inside of your mouth or nose? No  Did you need to seek  medical attention at a hospital or doctor's office? No  When did it last happen?        If all above answers are "NO", may proceed with cephalosporin use.  Any medicine related to PCN    Did it involve swelling of the face/tongue/throat, SOB, or low BP? No Did it involve sudden or  severe rash/hives, skin peeling, or any reaction on the inside of your mouth or nose? No Did you need to seek medical attention at a hospital or doctor's office? No When did it last happen?       If all above answers are "NO", may proceed with cephalosporin use.   Ciprofloxacin Hives   Docetaxel  Other (See Comments)    Tingling of lips, teeth hurting, hot tongue   Trastuzumab -Anns [Trastuzumab -Pkrb] Itching    Tingling and hot tongue and lips    Current Medications: Current Outpatient Medications  Medication Sig Dispense Refill   aluminum -magnesium  hydroxide 200-200 MG/5ML suspension Take 15 mLs by mouth every 6 (six) hours as needed for indigestion. Mix 1:1 with Lidocaine  and swish and spit 4 times a day (Patient not taking: Reported on 05/14/2023) 355 mL 2   Bioflavonoid Products (GRAPE SEED PO) Take 1 capsule by mouth daily. (Patient not taking: Reported on 03/25/2023)     Calcium Carbonate Antacid (TUMS PO) Take 1 tablet by mouth as needed. Reported on 05/22/2015 (Patient not taking: Reported on 03/25/2023)     CARBOPLATIN  IV Inject into the vein every 21 ( twenty-one) days. (Patient not taking: Reported on 05/14/2023)     cephALEXin  (KEFLEX ) 500 MG capsule Take 1 capsule (500 mg total) by mouth 4 (four) times daily. (Patient not taking: Reported on 05/14/2023) 40 capsule 0   Cholecalciferol (VITAMIN D  PO) Take 1,000 Units by mouth daily. (Patient not taking: Reported on 03/25/2023)     diphenoxylate -atropine  (LOMOTIL ) 2.5-0.025 MG tablet Take 1 tablet by mouth 4 (four) times daily as needed for diarrhea or loose stools. (Patient not taking: Reported on 05/14/2023) 60 tablet 0   DOCETAXEL  IV Inject into the vein every  21 ( twenty-one) days. (Patient not taking: Reported on 05/14/2023)     lidocaine  (XYLOCAINE ) 2 % solution Use as directed 15 mLs in the mouth or throat 4 (four) times daily. Mix 1:1 with maalox and swish and spit 4 times a day (Patient not taking: Reported on 05/14/2023) 100 mL 2   lidocaine -prilocaine  (EMLA ) cream Apply a quarter-sized amount to port a cath site and cover with plastic wrap 1 hour prior to infusion appointments (Patient not taking: Reported on 05/14/2023) 30 g 3   loratadine  (CLARITIN ) 5 MG chewable tablet Chew 5 mg by mouth daily. (Patient not taking: Reported on 05/14/2023)     montelukast  (SINGULAIR ) 10 MG tablet Take one 10mg  tab for 2 days before chemo (day 1 and day 2) (Patient not taking: Reported on 05/14/2023) 20 tablet 0   Multiple Vitamin (MULTI-VITAMIN) tablet Take 1 tablet by mouth daily. (Patient not taking: Reported on 05/14/2023)     nitrofurantoin , macrocrystal-monohydrate, (MACROBID ) 100 MG capsule Take 1 capsule (100 mg total) by mouth 2 (two) times daily. (Patient not taking: Reported on 05/14/2023) 10 capsule 0   ondansetron  (ZOFRAN ) 8 MG tablet Take 1 tablet (8 mg total) by mouth every 8 (eight) hours as needed for nausea or vomiting. (Patient not taking: Reported on 05/14/2023) 20 tablet 0   PERTUZUMAB  IV Inject into the vein every 21 ( twenty-one) days. (Patient not taking: Reported on 05/14/2023)     predniSONE  (DELTASONE ) 20 MG tablet Take one 20mg  tab for 2 days before chemo (day 1 and day 2) (Patient not taking: Reported on 05/14/2023) 20 tablet 0   TRASTUZUMAB  IV Inject into the vein every 21 ( twenty-one) days. (Patient not taking: Reported on 05/14/2023)     No current facility-administered medications for this  visit.    Review of Systems As per interval history.   Objective:  Physical Examination:  LMP 07/05/2012   Vitals:   05/21/23 1028  BP: 130/68  Pulse: 87  Resp: 20  Temp: 98.6 F (37 C)  SpO2: 100%   ECOG Performance Status: 1 - Symptomatic  but completely ambulatory  Exam deferred from 03/05/2023 GENERAL: Patient is a well appearing female in no acute distress HEENT:  Sclera clear. Anicteric NODES:  Negative axillary, supraclavicular, inguinal lymph node survery LUNGS:  Clear to auscultation bilaterally.   HEART:  Regular rate and rhythm.  ABDOMEN:  Soft, nontender.  No masses or ascites. Incisions healing well.  EXTREMITIES:  No peripheral edema. Atraumatic. No cyanosis SKIN:  Clear with no obvious rashes or skin changes.  NEURO:  Nonfocal. Well oriented.  Appropriate affect.  Pelvic: Exam chaperoned by CMA EGBUS: no lesions Vagina: no lesions, cuff healing well.   Bimanual: normal Rectovaginal: confirmatory.     Lab Review Labs on site today:   Chemistry      Component Value Date/Time   NA 138 05/14/2023 0851   K 3.8 05/14/2023 0851   CL 103 05/14/2023 0851   CO2 27 05/14/2023 0851   BUN 15 05/14/2023 0851   CREATININE 0.76 05/14/2023 0851      Component Value Date/Time   CALCIUM 9.4 05/14/2023 0851   ALKPHOS 58 05/14/2023 0851   AST 20 05/14/2023 0851   ALT 21 05/14/2023 0851   BILITOT 0.4 05/14/2023 0851      Lab Results  Component Value Date   WBC 4.7 05/14/2023   HGB 12.7 05/14/2023   HCT 38.1 05/14/2023   MCV 90.1 05/14/2023   PLT 167 05/14/2023   Radiology 11/04/2022  CLINICAL DATA:  Left adnexal mass identified by prior CT   EXAM: MRI PELVIS WITHOUT AND WITH CONTRAST   TECHNIQUE: Multiplanar multisequence MR imaging of the pelvis was performed both before and after administration of intravenous contrast.   CONTRAST:  4mL GADAVIST  GADOBUTROL  1 MMOL/ML IV SOLN   COMPARISON:  CT abdomen pelvis, 10/31/2022, CT abdomen pelvis, 02/22/2018   FINDINGS: Urinary Tract:  No abnormality visualized.   Bowel:  Unremarkable visualized pelvic bowel loops.   Vascular/Lymphatic: No pathologically enlarged lymph nodes. No significant vascular abnormality seen.   Reproductive: Normal uterus.  Normal postmenopausal appearance of the right ovary. Large, lobulated left ovarian mass measuring 7.9 x 7.0 x 5.9 cm (series 4, image 18). This is heterogeneously T2 hypointense with irregular regions of internal T2 hyperintensity and poorly enhancing.   Other:  Small volume free fluid throughout the pelvis.   Musculoskeletal: No suspicious bone lesions identified.   IMPRESSION: 1. Large, lobulated left ovarian mass measuring 7.9 x 7.0 x 5.9 cm. This is heterogeneously T2 hypointense with irregular regions of internal T2 hyperintensity and poorly enhancing. 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 In retrospective review, this lesion was present on remote prior CT dated 02/22/2018 and has significantly enlarged in size over the interval, demonstrating relatively indolent behavior. This is however nonetheless technically characterized as an O-RADS category 4 lesion and remains suspicious for solid malignancy. Recommend surgical referral. 2. Small volume free fluid throughout the pelvis. 3. No evidence of lymphadenopathy or metastatic disease in the pelvis.   These results will be called to the ordering clinician or representative by the Radiologist Assistant, and communication documented in the PACS or [employer].  Assessment:  MODUPE HURTADO  is a 58 y.o. BRCA2 mutation carrier diagnosed with 8.5 cm solid left adnexal mass that was present in 2020 CT scan and has slowly enlarged to 8.5 cm.  Normal CA125.  Elevated Inhibin B so suspect stromal ovarian tumor. .   On 04/08/23 underwent TLH, BSO washings for ovarian mass and BRCA2 germline mutation.  Mass was an ovarian fibroma and pathology and washings otherwise benign.  Normal post op exam today.   Hypokalemia, resolved  Anemia, resolved  Breast cancer on active treatment.   Plan:   Problem List Items Addressed This Visit       Other    BRCA2 gene mutation positive - Primary   She can resume normal activities.  In view of benign pathology, we can see her back in the clinic should the need arise in the future.  Dr. Randy Buttery will continue with Herceptin  maintenance therapy for breast cancer and Dr Chrystal will start breast radiation this week.   [place] BRCA2 testing has been done in the family.  Her brother is negative.  Daughter is 26 and has the BRCA2 mutation and is undergoing prophylactic mastectomy.   And she has twin daughters.    She removed a tick from her left inner thigh two days ago.  There is a red discoloration of about 2 cm around the area that is not growing since yesterday.  No evidence of infection.  Suggested she reach out to her PCP for advice on the need for any follow up.     Hermine Loots, MD

## 2023-05-22 ENCOUNTER — Ambulatory Visit: Payer: Self-pay | Admitting: *Deleted

## 2023-05-22 ENCOUNTER — Encounter: Payer: Self-pay | Admitting: Oncology

## 2023-05-22 ENCOUNTER — Ambulatory Visit: Admitting: Physician Assistant

## 2023-05-22 ENCOUNTER — Encounter: Payer: Self-pay | Admitting: Physician Assistant

## 2023-05-22 VITALS — BP 136/67 | HR 83 | Temp 98.4°F | Ht 63.0 in | Wt 89.6 lb

## 2023-05-22 DIAGNOSIS — W57XXXA Bitten or stung by nonvenomous insect and other nonvenomous arthropods, initial encounter: Secondary | ICD-10-CM

## 2023-05-22 DIAGNOSIS — S70362A Insect bite (nonvenomous), left thigh, initial encounter: Secondary | ICD-10-CM | POA: Diagnosis not present

## 2023-05-22 DIAGNOSIS — C50412 Malignant neoplasm of upper-outer quadrant of left female breast: Secondary | ICD-10-CM | POA: Diagnosis not present

## 2023-05-22 MED ORDER — DOXYCYCLINE HYCLATE 100 MG PO TABS
100.0000 mg | ORAL_TABLET | Freq: Two times a day (BID) | ORAL | 0 refills | Status: AC
Start: 2023-05-22 — End: 2023-05-29

## 2023-05-22 NOTE — Progress Notes (Signed)
   Acute Office Visit  Subjective:     Patient ID: Melissa Rodriguez, female    DOB: 1965-02-07, 58 y.o.   MRN: [id number]   Patient presents today for concerns of tick bite on her left inner thigh and a second insect bite on her left ankle. She reports noticing the tick Monday morning and assumes the tick attached Sunday evening while she was in the yard. She denies new rashes, fevers, joint pain, or neuro symptoms. Bite on her ankle is draining clear/yellow fluid. Patient starts radiation on Monday and currently receives Herceptin  treatments every 2 weeks.      Review of Systems  Constitutional:  Negative for chills, fever and malaise/fatigue.  Respiratory:  Negative for shortness of breath.   Cardiovascular:  Negative for chest pain and palpitations.  Musculoskeletal:  Negative for joint pain and myalgias.  Skin:  Negative for itching and rash.  Neurological:  Negative for sensory change, speech change and headaches.        Objective:     BP 136/67   Pulse 83   Temp 98.4 F (36.9 C)   Ht 5\' 3"  (1.6 m)   Wt 89 lb 9.6 oz (40.6 kg)   LMP 07/05/2012   SpO2 98%   BMI 15.87 kg/m   Physical Exam Constitutional:      Appearance: Normal appearance.  HENT:     Head: Normocephalic and atraumatic.     Nose: Nose normal.     Mouth/Throat:     Mouth: Mucous membranes are moist.     Pharynx: Oropharynx is clear.  Eyes:     Extraocular Movements: Extraocular movements intact.     Conjunctiva/sclera: Conjunctivae normal.  Cardiovascular:     Rate and Rhythm: Normal rate and regular rhythm.     Heart sounds: Normal heart sounds.  Pulmonary:     Effort: Pulmonary effort is normal.     Breath sounds: Normal breath sounds.  Skin:    General: Skin is warm and dry.     Findings: Lesion present.       Neurological:     General: No focal deficit present.     Mental Status: She is alert and oriented to person, place, and time.  Psychiatric:        Mood and Affect: Mood normal.         Behavior: Behavior normal.     No results found for any visits on 05/22/23.      Assessment & Plan:  Tick bite of left thigh, initial encounter Assessment & Plan: Patient presents today with 2 insect bites, one known to be caused by a tick. Patient sable today no fevers or signs of acute infection. Will treat with doxycycline x 1 week for tick bite and draining 2nd insect bite. Advised to take antibiotic with large glass of water and a meal. Patient advised to avoid direct sun exposure. Warning signs and precautions discussed.   Orders: -     Doxycycline Hyclate; Take 1 tablet (100 mg total) by mouth 2 (two) times daily for 7 days.  Dispense: 14 tablet; Refill: 0   Return if symptoms worsen or fail to improve.  Jearlean Mince [place] Gambone, PA-C

## 2023-05-22 NOTE — Telephone Encounter (Signed)
 Copied from CRM [id number]. Topic: Clinical - Red Word Triage >> May 22, 2023  8:21 AM Melissa Rodriguez wrote: Red Word that prompted transfer to Nurse Triage:  Tick bite on this past Monday on her left leg; she is going through cancer treatment; the area is red and discharging clear liquid; ring around it is dark red Reason for Disposition  [1] Scab is present AND [2] it drains pus or increases in size AND [3] not improved after applying antibiotic ointment for 2 days  Answer Assessment - Initial Assessment Questions 1. ATTACHED:  "Is the tick still on the skin?"  (e.g., yes, no, unsure)     I was bitten by a tick on my left leg.   I found it Monday morning.    It was a very tiny tick.   My leg was itching and I looked and found it.   It started oozing right away and has a dark red ring around it.    I'm going through cancer treatments.    I start radiation on Monday.    I've had 2 major surgeries.     2. ONSET - TICK STILL ATTACHED:  "How long do you think the tick has been on your skin?" (e.g., hours, days, unsure)  Note:  Is there a recent activity (camping, hiking) where the caller may have been exposed?     No    I cleaned it with peroxide and washed it with soap and water after I removed the tick.     I put honey on it last night. I have another bug bite lower down on my leg that is red and draining too.   I don't know what bit me.   I was out in freshly mowed yard. 3. ONSET - TICK NOT STILL ATTACHED: "If the tick has been removed, how long do you think the tick was attached before you removed it?" (e.g., 5 hours, 2 days). "When was this?"     No 4. LOCATION: "Where is the tick bite located?" (e.g., arm, leg)     Left leg 5. TYPE of TICK: "Is it a wood tick or a deer tick?" (e.g., deer tick, wood tick; unsure)     Tiny tick 6. SIZE of TICK: "How big is the tick?" (e.g., size of poppy seed, apple seed, watermelon seed; unsure) Note: Deer ticks can be the size of a poppy seed (nymph) or an apple seed  (adult).       Very tiny tick 7. ENGORGED: "Did the tick look flat or engorged (full, swollen)?" (e.g., flat, engorged; unsure)     No 8. OTHER SYMPTOMS: "Do you have any other symptoms?" (e.g., fever, rash, redness at bite area, red ring around bite)     The sites are red, draining clear fluid 9. PREGNANCY: "Is there any chance you are pregnant?" "When was your last menstrual period?"     N/A due to age  Protocols used: Tick Bite-A-AH  Chief Complaint: tick bite on left leg draining clear fluid and red around edges.   Has another insect bite on lower left leg with same symptoms but not sure what bit her there.   Pt is receiving cancer treatments.  Had double mastectomy and complete hysterectomy.   Has completed chemo and starting radiation on Monday. Symptoms: above Frequency: Bitten on Monday.   Tick bite has been draining since Monday and red. Pertinent Negatives: Patient denies Either bite getting better. Disposition: [] ED /[] Urgent Care (no appt availability  in office) / [x] Appointment(In office/virtual)/ []  [facility]/ [] Home Care/ [] Refused Recommended Disposition /[] [facility]/ []  Follow-up with PCP Additional Notes: Appt made for today with Jearlean Mince Grooms, PA-C at 10:20.

## 2023-05-22 NOTE — Assessment & Plan Note (Signed)
 Patient presents today with 2 insect bites, one known to be caused by a tick. Patient sable today no fevers or signs of acute infection. Will treat with doxycycline x 1 week for tick bite and draining 2nd insect bite. Advised to take antibiotic with large glass of water and a meal. Patient advised to avoid direct sun exposure. Warning signs and precautions discussed.

## 2023-05-22 NOTE — Telephone Encounter (Signed)
 Noted.

## 2023-05-23 NOTE — Telephone Encounter (Signed)
 Yeah patient can have port labs on may 21st instead of peripheral labs on 5/19

## 2023-05-26 ENCOUNTER — Ambulatory Visit
Admission: RE | Admit: 2023-05-26 | Discharge: 2023-05-26 | Disposition: A | Discharge status: Home / Self Care | Source: Ambulatory Visit | Attending: Radiation Oncology | Admitting: Radiation Oncology

## 2023-05-26 DIAGNOSIS — C50412 Malignant neoplasm of upper-outer quadrant of left female breast: Secondary | ICD-10-CM | POA: Diagnosis not present

## 2023-05-27 ENCOUNTER — Other Ambulatory Visit: Payer: Self-pay

## 2023-05-27 ENCOUNTER — Ambulatory Visit
Admission: RE | Admit: 2023-05-27 | Discharge: 2023-05-27 | Disposition: A | Discharge status: Home / Self Care | Source: Ambulatory Visit | Attending: Radiation Oncology | Admitting: Radiation Oncology

## 2023-05-27 DIAGNOSIS — C50412 Malignant neoplasm of upper-outer quadrant of left female breast: Secondary | ICD-10-CM | POA: Diagnosis not present

## 2023-05-27 LAB — RAD ONC ARIA SESSION SUMMARY
Course Elapsed Days: 0
Plan Fractions Treated to Date: 1
Plan Prescribed Dose Per Fraction: 2 Gy
Plan Total Fractions Prescribed: 25
Plan Total Prescribed Dose: 50 Gy
Reference Point Dosage Given to Date: 2 Gy
Reference Point Session Dosage Given: 2 Gy
Session Number: 1

## 2023-05-28 ENCOUNTER — Other Ambulatory Visit: Payer: Self-pay

## 2023-05-28 ENCOUNTER — Ambulatory Visit
Admission: RE | Admit: 2023-05-28 | Discharge: 2023-05-28 | Disposition: A | Discharge status: Home / Self Care | Source: Ambulatory Visit | Attending: Radiation Oncology | Admitting: Radiation Oncology

## 2023-05-28 DIAGNOSIS — C50412 Malignant neoplasm of upper-outer quadrant of left female breast: Secondary | ICD-10-CM | POA: Diagnosis not present

## 2023-05-28 LAB — RAD ONC ARIA SESSION SUMMARY
Course Elapsed Days: 1
Plan Fractions Treated to Date: 2
Plan Prescribed Dose Per Fraction: 2 Gy
Plan Total Fractions Prescribed: 25
Plan Total Prescribed Dose: 50 Gy
Reference Point Dosage Given to Date: 4 Gy
Reference Point Session Dosage Given: 2 Gy
Session Number: 2

## 2023-05-29 ENCOUNTER — Other Ambulatory Visit: Payer: Self-pay

## 2023-05-29 ENCOUNTER — Ambulatory Visit
Admission: RE | Admit: 2023-05-29 | Discharge: 2023-05-29 | Disposition: A | Discharge status: Home / Self Care | Source: Ambulatory Visit | Attending: Radiation Oncology | Admitting: Radiation Oncology

## 2023-05-29 DIAGNOSIS — C50412 Malignant neoplasm of upper-outer quadrant of left female breast: Secondary | ICD-10-CM | POA: Diagnosis not present

## 2023-05-29 LAB — RAD ONC ARIA SESSION SUMMARY
Course Elapsed Days: 2
Plan Fractions Treated to Date: 3
Plan Prescribed Dose Per Fraction: 2 Gy
Plan Total Fractions Prescribed: 25
Plan Total Prescribed Dose: 50 Gy
Reference Point Dosage Given to Date: 6 Gy
Reference Point Session Dosage Given: 2 Gy
Session Number: 3

## 2023-05-30 ENCOUNTER — Ambulatory Visit

## 2023-06-02 ENCOUNTER — Inpatient Hospital Stay

## 2023-06-02 ENCOUNTER — Other Ambulatory Visit: Payer: Self-pay

## 2023-06-02 ENCOUNTER — Encounter: Payer: Self-pay | Admitting: Oncology

## 2023-06-02 ENCOUNTER — Ambulatory Visit
Admission: RE | Admit: 2023-06-02 | Discharge: 2023-06-02 | Disposition: A | Discharge status: Home / Self Care | Source: Ambulatory Visit | Attending: Radiation Oncology | Admitting: Radiation Oncology

## 2023-06-02 DIAGNOSIS — C50412 Malignant neoplasm of upper-outer quadrant of left female breast: Secondary | ICD-10-CM | POA: Diagnosis not present

## 2023-06-02 LAB — RAD ONC ARIA SESSION SUMMARY
Course Elapsed Days: 6
Plan Fractions Treated to Date: 4
Plan Prescribed Dose Per Fraction: 2 Gy
Plan Total Fractions Prescribed: 25
Plan Total Prescribed Dose: 50 Gy
Reference Point Dosage Given to Date: 8 Gy
Reference Point Session Dosage Given: 2 Gy
Session Number: 4

## 2023-06-03 ENCOUNTER — Other Ambulatory Visit: Payer: Self-pay

## 2023-06-03 ENCOUNTER — Ambulatory Visit
Admission: RE | Admit: 2023-06-03 | Discharge: 2023-06-03 | Disposition: A | Discharge status: Home / Self Care | Source: Ambulatory Visit | Attending: Radiation Oncology | Admitting: Radiation Oncology

## 2023-06-03 DIAGNOSIS — C50412 Malignant neoplasm of upper-outer quadrant of left female breast: Secondary | ICD-10-CM | POA: Diagnosis not present

## 2023-06-03 LAB — RAD ONC ARIA SESSION SUMMARY
Course Elapsed Days: 7
Plan Fractions Treated to Date: 5
Plan Prescribed Dose Per Fraction: 2 Gy
Plan Total Fractions Prescribed: 25
Plan Total Prescribed Dose: 50 Gy
Reference Point Dosage Given to Date: 10 Gy
Reference Point Session Dosage Given: 2 Gy
Session Number: 5

## 2023-06-04 ENCOUNTER — Other Ambulatory Visit: Payer: Self-pay

## 2023-06-04 ENCOUNTER — Inpatient Hospital Stay

## 2023-06-04 ENCOUNTER — Ambulatory Visit
Admission: RE | Admit: 2023-06-04 | Discharge: 2023-06-04 | Disposition: A | Discharge status: Home / Self Care | Source: Ambulatory Visit | Attending: Radiation Oncology | Admitting: Radiation Oncology

## 2023-06-04 ENCOUNTER — Encounter: Payer: Self-pay | Admitting: Oncology

## 2023-06-04 ENCOUNTER — Inpatient Hospital Stay (HOSPITAL_BASED_OUTPATIENT_CLINIC_OR_DEPARTMENT_OTHER): Admitting: Oncology

## 2023-06-04 ENCOUNTER — Telehealth: Payer: Self-pay | Admitting: *Deleted

## 2023-06-04 VITALS — BP 117/52 | HR 74 | Temp 97.0°F | Resp 16

## 2023-06-04 VITALS — BP 150/63 | HR 88 | Temp 97.5°F | Resp 15 | Wt 91.0 lb

## 2023-06-04 DIAGNOSIS — Z170 Estrogen receptor positive status [ER+]: Secondary | ICD-10-CM

## 2023-06-04 DIAGNOSIS — C50412 Malignant neoplasm of upper-outer quadrant of left female breast: Secondary | ICD-10-CM

## 2023-06-04 DIAGNOSIS — Z5112 Encounter for antineoplastic immunotherapy: Secondary | ICD-10-CM

## 2023-06-04 LAB — CBC WITH DIFFERENTIAL (CANCER CENTER ONLY)
Abs Immature Granulocytes: 0.01 10*3/uL (ref 0.00–0.07)
Basophils Absolute: 0 10*3/uL (ref 0.0–0.1)
Basophils Relative: 1 %
Eosinophils Absolute: 0 10*3/uL (ref 0.0–0.5)
Eosinophils Relative: 1 %
HCT: 37.6 % (ref 36.0–46.0)
Hemoglobin: 12.6 g/dL (ref 12.0–15.0)
Immature Granulocytes: 0 %
Lymphocytes Relative: 24 %
Lymphs Abs: 1 10*3/uL (ref 0.7–4.0)
MCH: 29.3 pg (ref 26.0–34.0)
MCHC: 33.5 g/dL (ref 30.0–36.0)
MCV: 87.4 fL (ref 80.0–100.0)
Monocytes Absolute: 0.3 10*3/uL (ref 0.1–1.0)
Monocytes Relative: 8 %
Neutro Abs: 2.7 10*3/uL (ref 1.7–7.7)
Neutrophils Relative %: 66 %
Platelet Count: 167 10*3/uL (ref 150–400)
RBC: 4.3 MIL/uL (ref 3.87–5.11)
RDW: 13 % (ref 11.5–15.5)
WBC Count: 4.1 10*3/uL (ref 4.0–10.5)
nRBC: 0 % (ref 0.0–0.2)

## 2023-06-04 LAB — CMP (CANCER CENTER ONLY)
ALT: 19 U/L (ref 0–44)
AST: 20 U/L (ref 15–41)
Albumin: 3.7 g/dL (ref 3.5–5.0)
Alkaline Phosphatase: 55 U/L (ref 38–126)
Anion gap: 8 (ref 5–15)
BUN: 18 mg/dL (ref 6–20)
CO2: 28 mmol/L (ref 22–32)
Calcium: 9 mg/dL (ref 8.9–10.3)
Chloride: 101 mmol/L (ref 98–111)
Creatinine: 0.71 mg/dL (ref 0.44–1.00)
GFR, Estimated: >60 mL/min (ref >60)
Glucose, Bld: 96 mg/dL (ref 70–99)
Potassium: 3.6 mmol/L (ref 3.5–5.1)
Sodium: 137 mmol/L (ref 135–145)
Total Bilirubin: 0.8 mg/dL (ref 0.0–1.2)
Total Protein: 6.7 g/dL (ref 6.5–8.1)

## 2023-06-04 LAB — RAD ONC ARIA SESSION SUMMARY
Course Elapsed Days: 8
Plan Fractions Treated to Date: 6
Plan Prescribed Dose Per Fraction: 2 Gy
Plan Total Fractions Prescribed: 25
Plan Total Prescribed Dose: 50 Gy
Reference Point Dosage Given to Date: 12 Gy
Reference Point Session Dosage Given: 2 Gy
Session Number: 6

## 2023-06-04 MED ORDER — FAMOTIDINE IN NACL 20-0.9 MG/50ML-% IV SOLN
20.0000 mg | Freq: Once | INTRAVENOUS | Status: AC
  Administered 2023-06-04: 20 mg via INTRAVENOUS
  Filled 2023-06-04: qty 50

## 2023-06-04 MED ORDER — SODIUM CHLORIDE 0.9 % IV SOLN
INTRAVENOUS | Status: DC
  Filled 2023-06-04: qty 250

## 2023-06-04 MED ORDER — TRASTUZUMAB-ANNS CHEMO 150 MG IV SOLR
6.0000 mg/kg | Freq: Once | INTRAVENOUS | Status: AC
  Administered 2023-06-04: 252 mg via INTRAVENOUS
  Filled 2023-06-04: qty 12

## 2023-06-04 MED ORDER — METHYLPREDNISOLONE SODIUM SUCC 125 MG IJ SOLR
125.0000 mg | Freq: Once | INTRAMUSCULAR | Status: AC
  Administered 2023-06-04: 125 mg via INTRAVENOUS
  Filled 2023-06-04: qty 2

## 2023-06-04 MED ORDER — ACETAMINOPHEN 325 MG PO TABS
650.0000 mg | ORAL_TABLET | Freq: Once | ORAL | Status: AC
  Administered 2023-06-04: 650 mg via ORAL
  Filled 2023-06-04: qty 2

## 2023-06-04 MED ORDER — HEPARIN SOD (PORK) LOCK FLUSH 100 UNIT/ML IV SOLN
500.0000 [IU] | Freq: Once | INTRAVENOUS | Status: AC | PRN
  Administered 2023-06-04: 500 [IU]
  Filled 2023-06-04: qty 5

## 2023-06-04 NOTE — Telephone Encounter (Signed)
 Patient called and said that she got Herceptin  today and she is had to very runny diarrhea.  She did say that some of the premeds was taken off and she did not know if that is causing any problems.  Herceptin  can cause diarrhea.  The patient wants to have a call from the round team to see what else she needs to do because she is already taken two pills imodium.

## 2023-06-04 NOTE — Progress Notes (Signed)
 Hematology/Oncology Consult note [facility]  [phone] [phone]  Patient Care Team: Bennet Brasil, MD as PCP - General (Family Medicine) Paulett Boros, MD as Medical Oncologist (Medical Oncology) Gerhard Knuckles, RN as Oncology Nurse Navigator (Medical Oncology) Avonne Boettcher, MD as Consulting Physician (Oncology) Rochell Chroman, RN as Oncology Nurse Navigator Glenis Langdon, MD as Consulting Physician (Radiation Oncology)   Name of the patient: Melissa Rodriguez  [id number]  1965/04/01   Date of visit: 06/04/23  Diagnosis- pathologic prognostic stage Ib invasive mammary carcinoma of the left breast T2 N1 M0 ER/PR positive HER2 positive     Chief complaint/ Reason for visit-on treatment assessment prior to cycle 10 of adjuvant Herceptin   Heme/Onc history: Patient is a 58 year old female with a prior history of lumpectomy in 2008 which showed a 7 mm stage I ER/PR positive HER2 negative left breast cancer.  She underwent lumpectomy and adjuvant radiation therapy but did not go through endocrine therapy.   She self palpated a left breast lump led to a diagnostic mammogram in July 2024.  Diagnostic mammogram showed 1.9 cm irregular hypoechoic mass 1 o'clock position 7 cm from the breast grade 3 ER 99% positive, PR 85% positive and HER2 equivocal by IHC and FISH positive.  Ki-67 35% there were calcifications noted in the right breast as well which were biopsy-proven benign.     She underwent genetic testing and was found to have BRCA2 mutation   Patient was seen by Dr. Cheree Cords and he recommended upfront surgery and consideration for adjuvant chemotherapy.  Patient underwent bilateral mastectomy on 08/15/2022.  No evidence of malignancy in the right breast.  There were 2 distinct masses noted in the left breast with dominant mass measured 2.4 x 2.3 x 1.6 cm grade 3 with negative margins.  There was another 1 cm mass where there was  uptake of radiotracer dye with positive posterior lateral anterior margin.  This was believed to be the sentinel lymph node although on pathology it states that there were no lymph nodes identified likely due to prior lumpectomy. mpT2.  She was staged as T2 N1 triple positive disease.   Patient completed 6 cycles of adjuvant TCHP chemotherapy and is presently on maintenance Herceptin  and Perjeta .  Patient found to have 7.9 cm lobulated left ovarian mass which was resected at [facility] in March 2025.  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  No pathologic diagnosis was noted in the uterus contralateral ovary and fallopian tube.  Interval history-patient received only her Herceptin  with last cycle in felt that her quality of life is much improved.  She had more energy and diarrhea was not as bad.  She was able to eat better.  She would like to continue with Herceptin  alone at this time  ECOG PS- 0 Pain scale- 0   Review of systems- Review of Systems  Constitutional:  Negative for chills, fever, malaise/fatigue and weight loss.  HENT:  Negative for congestion, ear discharge and nosebleeds.   Eyes:  Negative for blurred vision.  Respiratory:  Negative for cough, hemoptysis, sputum production, shortness of breath and wheezing.   Cardiovascular:  Negative for chest pain, palpitations, orthopnea and claudication.  Gastrointestinal:  Negative for abdominal pain, blood in stool, constipation, diarrhea, heartburn, melena, nausea and vomiting.  Genitourinary:  Negative for dysuria, flank pain, frequency, hematuria and urgency.  Musculoskeletal:  Negative for back pain, joint pain and myalgias.  Skin:  Negative for rash.  Neurological:  Negative for dizziness, tingling, focal weakness, seizures, weakness and headaches.  Endo/Heme/Allergies:  Does not bruise/bleed easily.  Psychiatric/Behavioral:  Negative for depression and suicidal ideas. The patient  does not have insomnia.       Allergies  Allergen Reactions   Benadryl  [Diphenhydramine  Hcl]     Jittery and Hallucinations   Diphenhydramine  Hcl Anxiety    Other Reaction(s): Hallucination  Jittery and Hallucinations    Made her feel like she was crawling the walls.   Hydrocodone  Shortness Of Breath    Felt like she could not breathe-February 2020   Penicillins Hives and Rash    Did it involve swelling of the face/tongue/throat, SOB, or low BP? No  Did it involve sudden or severe rash/hives, skin peeling, or any reaction on the inside of your mouth or nose? No  Did you need to seek medical attention at a hospital or doctor's office? No  When did it last happen?        If all above answers are "NO", may proceed with cephalosporin use.  Any medicine related to PCN    Did it involve swelling of the face/tongue/throat, SOB, or low BP? No Did it involve sudden or severe rash/hives, skin peeling, or any reaction on the inside of your mouth or nose? No Did you need to seek medical attention at a hospital or doctor's office? No When did it last happen?       If all above answers are "NO", may proceed with cephalosporin use.   Ciprofloxacin Hives   Docetaxel  Other (See Comments)    Tingling of lips, teeth hurting, hot tongue   Trastuzumab -Anns [Trastuzumab -Pkrb] Itching    Tingling and hot tongue and lips     Past Medical History:  Diagnosis Date   Adenocarcinoma of breast (HCC) 05/20/2011   Stage I (T1b N0), grade 1, well-differentiated adenocarcinoma of the medial lower quadrant of the left breast. It was a tubular carcinoma, status post lumpectomy on 03/13/2006, ER positive 79%, PR positive 92%, HER2/neu negative, Ki-67 marker low at 18%. Her cancer was 7 mm in size, and she had 4 sentinel nodes all negative along with her lumpectomy. She was treated with radiation therapy postoper   Complication of anesthesia    pt states her bladder did not wake up after one surgery. Went to  ER, had a urinary catheter for 1 week   History of kidney stones    Iron deficiency    Iron deficiency anemia 05/20/2011   Secondary to GU bleeding.  Good absorber of PO iron   Personal history of radiation therapy      Past Surgical History:  Procedure Laterality Date   BREAST BIOPSY Left 07/16/2022   US  LT BREAST BX W LOC DEV 1ST LESION IMG BX SPEC US  GUIDE 07/16/2022 Alger Infield, MD [facility]   BREAST BIOPSY Right 07/26/2022   MM RT BREAST BX W LOC DEV 1ST LESION IMAGE BX SPEC STEREO GUIDE 07/26/2022 [facility]   BREAST LUMPECTOMY Left    EXTRACORPOREAL SHOCK WAVE LITHOTRIPSY Left 02/26/2018   Procedure: EXTRACORPOREAL SHOCK WAVE LITHOTRIPSY (ESWL);  Surgeon: Marco Severs, MD;  Location: [facility];  Service: Urology;  Laterality: Left;   LUMPECTOM LEFT BREAST  02/2006   LYMPH NODE BIOPSY LEFT  04/2006   MASTECTOMY W/ SENTINEL NODE BIOPSY Left 08/15/2022   Procedure: LEFT SIMPLE MASTECTOMY, LEFT SENTINEL LYMPH NODE MAPPING;  Surgeon: Sim Dryer, MD;  Location: [facility];  Service: General;  Laterality: Left;   PORTACATH PLACEMENT N/A 10/09/2022   Procedure: INSERTION PORT-A-CATH;  Surgeon: Sim Dryer, MD;  Location: [facility];  Service: General;  Laterality: N/A;   SIMPLE MASTECTOMY WITH AXILLARY SENTINEL NODE BIOPSY Right 08/15/2022   Procedure: RIGHT RISK REDUCING MASTECTOMY;  Surgeon: Sim Dryer, MD;  Location: [facility];  Service: General;  Laterality: Right;    Social History   Socioeconomic History   Marital status: Married    Spouse name: Not on file   Number of children: Not on file   Years of education: Not on file   Highest education level: Some college, no degree  Occupational History   Not on file  Tobacco Use   Smoking status: Never   Smokeless tobacco: Never  Vaping Use   Vaping status: Never Used  Substance and Sexual Activity   Alcohol use: No   Drug use: No   Sexual activity: Yes     Birth control/protection: Post-menopausal  Other Topics Concern   Not on file  Social History Narrative   Not on file   Social Drivers of Health   Financial Resource Strain: Patient Declined (10/16/2022)   Overall Financial Resource Strain (CARDIA)    Difficulty of Paying Living Expenses: Patient declined  Recent Concern: Physicist, medical Strain - High Risk (10/03/2022)   Overall Financial Resource Strain (CARDIA)    Difficulty of Paying Living Expenses: Hard  Food Insecurity: No Food Insecurity (11/27/2022)   Hunger Vital Sign    Worried About Running Out of Food in the Last Year: Never true    Ran Out of Food in the Last Year: Never true  Transportation Needs: No Transportation Needs (11/27/2022)   PRAPARE - Administrator, Civil Service (Medical): No    Lack of Transportation (Non-Medical): No  Physical Activity: Insufficiently Active (10/16/2022)   Exercise Vital Sign    Days of Exercise per Week: 1 day    Minutes of Exercise per Session: 20 min  Stress: No Stress Concern Present (10/16/2022)   [employer] of Occupational Health - Occupational Stress Questionnaire    Feeling of Stress : Not at all  Social Connections: Unknown (10/16/2022)   Social Connection and Isolation Panel [NHANES]    Frequency of Communication with Friends and Family: Patient declined    Frequency of Social Gatherings with Friends and Family: Patient declined    Attends Religious Services: More than 4 times per year    Active Member of [employer] or Organizations: Yes    Attends Engineer, structural: More than 4 times per year    Marital Status: Married  Catering manager Violence: Not At Risk (11/27/2022)   Humiliation, Afraid, Rape, and Kick questionnaire    Fear of Current or Ex-Partner: No    Emotionally Abused: No    Physically Abused: No    Sexually Abused: No    Family History  Problem Relation Age of Onset   Hypertension Mother    Hypertension Father    Heart disease  Father 66       MI in his 29s   Cancer Maternal Aunt        unk type, possibly hip. dx >50   Breast cancer Paternal Aunt        dx 87s   Cancer Paternal Aunt        unknown, d. 37s   Prostate cancer Cousin        dx 40s-50s   Stomach  cancer Cousin        dx 40s-50s     Current Outpatient Medications:    aluminum -magnesium  hydroxide 200-200 MG/5ML suspension, Take 15 mLs by mouth every 6 (six) hours as needed for indigestion. Mix 1:1 with Lidocaine  and swish and spit 4 times a day, Disp: 355 mL, Rfl: 2   Bioflavonoid Products (GRAPE SEED PO), Take 1 capsule by mouth daily., Disp: , Rfl:    Calcium Carbonate Antacid (TUMS PO), Take 1 tablet by mouth as needed. Reported on 05/22/2015, Disp: , Rfl:    CARBOPLATIN  IV, Inject into the vein every 21 ( twenty-one) days., Disp: , Rfl:    Cholecalciferol (VITAMIN D  PO), Take 1,000 Units by mouth daily., Disp: , Rfl:    diphenoxylate -atropine  (LOMOTIL ) 2.5-0.025 MG tablet, Take 1 tablet by mouth 4 (four) times daily as needed for diarrhea or loose stools., Disp: 60 tablet, Rfl: 0   DOCETAXEL  IV, Inject into the vein every 21 ( twenty-one) days., Disp: , Rfl:    lidocaine  (XYLOCAINE ) 2 % solution, Use as directed 15 mLs in the mouth or throat 4 (four) times daily. Mix 1:1 with maalox and swish and spit 4 times a day, Disp: 100 mL, Rfl: 2   lidocaine -prilocaine  (EMLA ) cream, Apply a quarter-sized amount to port a cath site and cover with plastic wrap 1 hour prior to infusion appointments, Disp: 30 g, Rfl: 3   loratadine  (CLARITIN ) 5 MG chewable tablet, Chew 5 mg by mouth daily., Disp: , Rfl:    montelukast  (SINGULAIR ) 10 MG tablet, Take one 10mg  tab for 2 days before chemo (day 1 and day 2), Disp: 20 tablet, Rfl: 0   Multiple Vitamin (MULTI-VITAMIN) tablet, Take 1 tablet by mouth daily., Disp: , Rfl:    ondansetron  (ZOFRAN ) 8 MG tablet, Take 1 tablet (8 mg total) by mouth every 8 (eight) hours as needed for nausea or vomiting., Disp: 20 tablet, Rfl: 0    PERTUZUMAB  IV, Inject into the vein every 21 ( twenty-one) days., Disp: , Rfl:    predniSONE  (DELTASONE ) 20 MG tablet, Take one 20mg  tab for 2 days before chemo (day 1 and day 2), Disp: 20 tablet, Rfl: 0   TRASTUZUMAB  IV, Inject into the vein every 21 ( twenty-one) days., Disp: , Rfl:   Physical exam:  Vitals:   06/04/23 0834  BP: (!) 150/63  Pulse: 88  Resp: 15  Temp: (!) 97.5 F (36.4 C)  TempSrc: Oral  SpO2: 100%  Weight: 91 lb (41.3 kg)   Physical Exam Cardiovascular:     Rate and Rhythm: Normal rate and regular rhythm.     Heart sounds: Normal heart sounds.  Pulmonary:     Effort: Pulmonary effort is normal.     Breath sounds: Normal breath sounds.  Abdominal:     General: Bowel sounds are normal.     Palpations: Abdomen is soft.  Skin:    General: Skin is warm and dry.  Neurological:     Mental Status: She is alert and oriented to person, place, and time.      I have personally reviewed labs listed below:    Latest Ref Rng & Units 05/14/2023    8:51 AM  CMP  Glucose 70 - 99 mg/dL 161   BUN 6 - 20 mg/dL 15   Creatinine 0.96 - 1.00 mg/dL 0.45   Sodium 409 - 811 mmol/L 138   Potassium 3.5 - 5.1 mmol/L 3.8   Chloride 98 - 111 mmol/L 103  CO2 22 - 32 mmol/L 27   Calcium 8.9 - 10.3 mg/dL 9.4   Total Protein 6.5 - 8.1 g/dL 6.5   Total Bilirubin 0.0 - 1.2 mg/dL 0.4   Alkaline Phos 38 - 126 U/L 58   AST 15 - 41 U/L 20   ALT 0 - 44 U/L 21       Latest Ref Rng & Units 05/14/2023    8:51 AM  CBC  WBC 4.0 - 10.5 K/uL 4.7   Hemoglobin 12.0 - 15.0 g/dL 41.6   Hematocrit 60.6 - 46.0 % 38.1   Platelets 150 - 400 K/uL 167      Assessment and plan- Patient is a 58 y.o. female with history of BRCA2 mutation and second left breast cancer stage Ib MP T2 N0 versus pT1 cN1 M0 ER/PR positive HER2 positive.  She is here for on treatment assessment prior to cycle 10 of adjuvant Herceptin   Patient received adjuvant Herceptin  and Perjeta  up until cycle 9.  However due to  ongoing diarrhea weight loss and poor quality of life she wanted to see how it would be without perjeta .  She seems to have tolerated single agent Herceptin  much better with improvement in her quality of life as well as oral intake and improvement in her diarrhea.  I am therefore holding off on giving her perjeta  today and she will continue with Herceptin  for the remainder of maintenance cycles.  I am also dropping off cetirizine  from her premedications as it is unclear as to which medicine she truly reacted to when she had TCHP chemotherapy.  I will see her back in 3 weeks for cycle 11.  She will be due for repeat echocardiogram in 4 to 5 weeks time.  She has started adjuvant radiation therapy at this time and I will discuss endocrine therapy with her upon completion of radiation   Visit Diagnosis 1. Encounter for monoclonal antibody treatment for malignancy   2. Malignant neoplasm of upper-outer quadrant of left breast in female, estrogen receptor positive (HCC)      Dr. Seretha Dance, MD, MPH [facility] [phone] 06/04/2023 8:21 AM

## 2023-06-04 NOTE — Telephone Encounter (Signed)
 We only took the cetrizine off but that should not cause diarrhea. We will see how she feels with this cycle

## 2023-06-04 NOTE — Progress Notes (Signed)
 Nutrition Follow-up:  Patient with left breast cancer, triple positive.  Completed TCHP chemotherapy.  Receiving herceptin  only at this time as perjeta  increased diarrhea.    Met with patient during infusion.  Reports that she had less diarrhea with herceptin  alone.  Able to add more foods to her diet (peas and carrots, pizza, cottage cheese, chicken salad with mayo, carrot and tomato juice).      Medications: reviewed  Labs: reviewed  Anthropometrics:   Weight 91 lb today  90 lb on 4/30 92 lb on 3/11 92 lb 3.2 oz on 3/5 86 lb on 1/20 92 lb on 12/9 91 lb 4.8 oz on 11/20  NUTRITION DIAGNOSIS: Inadequate oral intake improving    INTERVENTION:  Continue to increase calories and protein for weight gain.     MONITORING, EVALUATION, GOAL: weight trends, intake   NEXT VISIT: Wednesday, June 11 during infusion  Ronold Hardgrove B. Zollie Hipp, CSO, LDN Registered Dietitian [phone]

## 2023-06-05 ENCOUNTER — Ambulatory Visit

## 2023-06-05 ENCOUNTER — Encounter: Payer: Self-pay | Admitting: Oncology

## 2023-06-05 NOTE — Telephone Encounter (Signed)
 Per Dr. Randy Buttery "We only took the cetrizine off but that should not cause diarrhea. We will see how she feels with this cycle".  Outbound call to patient; informed of above.  Patient states she just had diarrhea twice yesterday and now all is fine. Says diarrhea usually starts 2 days after treatment, not the day off.  Patient also mentioned she just got her tooth extracted; advised to stay hydrated and if diarrhea returns to limit high fiber foods including raw fruits and vegetables, anything spicy or fried, whole grain products, nuts, popcorn, et Jackqueline Mason.  Advised for patient to contact clinic for any new and / or worsening symptoms.

## 2023-06-06 ENCOUNTER — Ambulatory Visit

## 2023-06-10 ENCOUNTER — Ambulatory Visit
Admission: RE | Admit: 2023-06-10 | Discharge: 2023-06-10 | Disposition: A | Discharge status: Home / Self Care | Source: Ambulatory Visit | Attending: Radiation Oncology | Admitting: Radiation Oncology

## 2023-06-10 ENCOUNTER — Other Ambulatory Visit: Payer: Self-pay

## 2023-06-10 DIAGNOSIS — C50412 Malignant neoplasm of upper-outer quadrant of left female breast: Secondary | ICD-10-CM | POA: Diagnosis not present

## 2023-06-10 LAB — RAD ONC ARIA SESSION SUMMARY
Course Elapsed Days: 14
Plan Fractions Treated to Date: 7
Plan Prescribed Dose Per Fraction: 2 Gy
Plan Total Fractions Prescribed: 25
Plan Total Prescribed Dose: 50 Gy
Reference Point Dosage Given to Date: 14 Gy
Reference Point Session Dosage Given: 2 Gy
Session Number: 7

## 2023-06-11 ENCOUNTER — Ambulatory Visit
Admission: RE | Admit: 2023-06-11 | Discharge: 2023-06-11 | Disposition: A | Discharge status: Home / Self Care | Source: Ambulatory Visit | Attending: Radiation Oncology | Admitting: Radiation Oncology

## 2023-06-11 ENCOUNTER — Other Ambulatory Visit: Payer: Self-pay

## 2023-06-11 DIAGNOSIS — C50412 Malignant neoplasm of upper-outer quadrant of left female breast: Secondary | ICD-10-CM | POA: Diagnosis not present

## 2023-06-11 LAB — RAD ONC ARIA SESSION SUMMARY
Course Elapsed Days: 15
Plan Fractions Treated to Date: 8
Plan Prescribed Dose Per Fraction: 2 Gy
Plan Total Fractions Prescribed: 25
Plan Total Prescribed Dose: 50 Gy
Reference Point Dosage Given to Date: 16 Gy
Reference Point Session Dosage Given: 2 Gy
Session Number: 8

## 2023-06-12 ENCOUNTER — Ambulatory Visit
Admission: RE | Admit: 2023-06-12 | Discharge: 2023-06-12 | Disposition: A | Discharge status: Home / Self Care | Source: Ambulatory Visit | Attending: Radiation Oncology | Admitting: Radiation Oncology

## 2023-06-12 ENCOUNTER — Other Ambulatory Visit: Payer: Self-pay

## 2023-06-12 ENCOUNTER — Ambulatory Visit

## 2023-06-12 DIAGNOSIS — C50412 Malignant neoplasm of upper-outer quadrant of left female breast: Secondary | ICD-10-CM | POA: Diagnosis not present

## 2023-06-12 LAB — RAD ONC ARIA SESSION SUMMARY
Course Elapsed Days: 16
Plan Fractions Treated to Date: 9
Plan Prescribed Dose Per Fraction: 2 Gy
Plan Total Fractions Prescribed: 25
Plan Total Prescribed Dose: 50 Gy
Reference Point Dosage Given to Date: 18 Gy
Reference Point Session Dosage Given: 2 Gy
Session Number: 9

## 2023-06-13 ENCOUNTER — Other Ambulatory Visit: Payer: Self-pay

## 2023-06-13 ENCOUNTER — Ambulatory Visit
Admission: RE | Admit: 2023-06-13 | Discharge: 2023-06-13 | Disposition: A | Discharge status: Home / Self Care | Source: Ambulatory Visit | Attending: Radiation Oncology | Admitting: Radiation Oncology

## 2023-06-13 DIAGNOSIS — C50412 Malignant neoplasm of upper-outer quadrant of left female breast: Secondary | ICD-10-CM | POA: Diagnosis not present

## 2023-06-13 LAB — RAD ONC ARIA SESSION SUMMARY
Course Elapsed Days: 17
Plan Fractions Treated to Date: 10
Plan Prescribed Dose Per Fraction: 2 Gy
Plan Total Fractions Prescribed: 25
Plan Total Prescribed Dose: 50 Gy
Reference Point Dosage Given to Date: 20 Gy
Reference Point Session Dosage Given: 2 Gy
Session Number: 10

## 2023-06-16 ENCOUNTER — Inpatient Hospital Stay

## 2023-06-16 ENCOUNTER — Other Ambulatory Visit: Payer: Self-pay

## 2023-06-16 ENCOUNTER — Ambulatory Visit
Admission: RE | Admit: 2023-06-16 | Discharge: 2023-06-16 | Disposition: A | Discharge status: Home / Self Care | Source: Ambulatory Visit | Attending: Radiation Oncology | Admitting: Radiation Oncology

## 2023-06-16 DIAGNOSIS — Z87442 Personal history of urinary calculi: Secondary | ICD-10-CM | POA: Diagnosis not present

## 2023-06-16 DIAGNOSIS — R1909 Other intra-abdominal and pelvic swelling, mass and lump: Secondary | ICD-10-CM | POA: Diagnosis not present

## 2023-06-16 DIAGNOSIS — D509 Iron deficiency anemia, unspecified: Secondary | ICD-10-CM | POA: Diagnosis not present

## 2023-06-16 DIAGNOSIS — C50412 Malignant neoplasm of upper-outer quadrant of left female breast: Secondary | ICD-10-CM | POA: Diagnosis present

## 2023-06-16 DIAGNOSIS — Z170 Estrogen receptor positive status [ER+]: Secondary | ICD-10-CM | POA: Diagnosis not present

## 2023-06-16 DIAGNOSIS — Z1501 Genetic susceptibility to malignant neoplasm of breast: Secondary | ICD-10-CM | POA: Diagnosis not present

## 2023-06-16 LAB — RAD ONC ARIA SESSION SUMMARY
Course Elapsed Days: 20
Plan Fractions Treated to Date: 11
Plan Prescribed Dose Per Fraction: 2 Gy
Plan Total Fractions Prescribed: 25
Plan Total Prescribed Dose: 50 Gy
Reference Point Dosage Given to Date: 22 Gy
Reference Point Session Dosage Given: 2 Gy
Session Number: 11

## 2023-06-17 ENCOUNTER — Encounter: Payer: Self-pay | Admitting: Oncology

## 2023-06-17 ENCOUNTER — Other Ambulatory Visit: Payer: Self-pay

## 2023-06-17 ENCOUNTER — Ambulatory Visit
Admission: RE | Admit: 2023-06-17 | Discharge: 2023-06-17 | Disposition: A | Discharge status: Home / Self Care | Source: Ambulatory Visit | Attending: Radiation Oncology | Admitting: Radiation Oncology

## 2023-06-17 DIAGNOSIS — C50412 Malignant neoplasm of upper-outer quadrant of left female breast: Secondary | ICD-10-CM | POA: Diagnosis not present

## 2023-06-17 LAB — RAD ONC ARIA SESSION SUMMARY
Course Elapsed Days: 21
Plan Fractions Treated to Date: 12
Plan Prescribed Dose Per Fraction: 2 Gy
Plan Total Fractions Prescribed: 25
Plan Total Prescribed Dose: 50 Gy
Reference Point Dosage Given to Date: 24 Gy
Reference Point Session Dosage Given: 2 Gy
Session Number: 12

## 2023-06-18 ENCOUNTER — Other Ambulatory Visit: Payer: Self-pay

## 2023-06-18 ENCOUNTER — Ambulatory Visit

## 2023-06-18 ENCOUNTER — Ambulatory Visit
Admission: RE | Admit: 2023-06-18 | Discharge: 2023-06-18 | Disposition: A | Discharge status: Home / Self Care | Source: Ambulatory Visit | Attending: Radiation Oncology | Admitting: Radiation Oncology

## 2023-06-18 DIAGNOSIS — C50412 Malignant neoplasm of upper-outer quadrant of left female breast: Secondary | ICD-10-CM | POA: Diagnosis not present

## 2023-06-18 LAB — RAD ONC ARIA SESSION SUMMARY
Course Elapsed Days: 22
Plan Fractions Treated to Date: 13
Plan Prescribed Dose Per Fraction: 2 Gy
Plan Total Fractions Prescribed: 25
Plan Total Prescribed Dose: 50 Gy
Reference Point Dosage Given to Date: 26 Gy
Reference Point Session Dosage Given: 2 Gy
Session Number: 13

## 2023-06-19 ENCOUNTER — Other Ambulatory Visit: Payer: Self-pay

## 2023-06-19 ENCOUNTER — Ambulatory Visit
Admission: RE | Admit: 2023-06-19 | Discharge: 2023-06-19 | Disposition: A | Discharge status: Home / Self Care | Source: Ambulatory Visit | Attending: Radiation Oncology | Admitting: Radiation Oncology

## 2023-06-19 DIAGNOSIS — C50412 Malignant neoplasm of upper-outer quadrant of left female breast: Secondary | ICD-10-CM | POA: Diagnosis not present

## 2023-06-19 LAB — RAD ONC ARIA SESSION SUMMARY
Course Elapsed Days: 23
Plan Fractions Treated to Date: 14
Plan Prescribed Dose Per Fraction: 2 Gy
Plan Total Fractions Prescribed: 25
Plan Total Prescribed Dose: 50 Gy
Reference Point Dosage Given to Date: 28 Gy
Reference Point Session Dosage Given: 2 Gy
Session Number: 14

## 2023-06-20 ENCOUNTER — Other Ambulatory Visit: Payer: Self-pay

## 2023-06-20 ENCOUNTER — Ambulatory Visit
Admission: RE | Admit: 2023-06-20 | Discharge: 2023-06-20 | Disposition: A | Discharge status: Home / Self Care | Source: Ambulatory Visit | Attending: Radiation Oncology | Admitting: Radiation Oncology

## 2023-06-20 DIAGNOSIS — C50412 Malignant neoplasm of upper-outer quadrant of left female breast: Secondary | ICD-10-CM | POA: Diagnosis not present

## 2023-06-20 LAB — RAD ONC ARIA SESSION SUMMARY
Course Elapsed Days: 24
Plan Fractions Treated to Date: 15
Plan Prescribed Dose Per Fraction: 2 Gy
Plan Total Fractions Prescribed: 25
Plan Total Prescribed Dose: 50 Gy
Reference Point Dosage Given to Date: 30 Gy
Reference Point Session Dosage Given: 2 Gy
Session Number: 15

## 2023-06-23 ENCOUNTER — Ambulatory Visit
Admission: RE | Admit: 2023-06-23 | Discharge: 2023-06-23 | Disposition: A | Discharge status: Home / Self Care | Source: Ambulatory Visit | Attending: Radiation Oncology | Admitting: Radiation Oncology

## 2023-06-23 ENCOUNTER — Other Ambulatory Visit: Payer: Self-pay

## 2023-06-23 DIAGNOSIS — C50412 Malignant neoplasm of upper-outer quadrant of left female breast: Secondary | ICD-10-CM | POA: Diagnosis not present

## 2023-06-23 LAB — RAD ONC ARIA SESSION SUMMARY
Course Elapsed Days: 27
Plan Fractions Treated to Date: 16
Plan Prescribed Dose Per Fraction: 2 Gy
Plan Total Fractions Prescribed: 25
Plan Total Prescribed Dose: 50 Gy
Reference Point Dosage Given to Date: 32 Gy
Reference Point Session Dosage Given: 2 Gy
Session Number: 16

## 2023-06-24 ENCOUNTER — Other Ambulatory Visit: Payer: Self-pay

## 2023-06-24 ENCOUNTER — Ambulatory Visit
Admission: RE | Admit: 2023-06-24 | Discharge: 2023-06-24 | Discharge status: Home / Self Care | Source: Ambulatory Visit | Attending: Radiation Oncology | Admitting: Radiation Oncology

## 2023-06-24 DIAGNOSIS — C50412 Malignant neoplasm of upper-outer quadrant of left female breast: Secondary | ICD-10-CM | POA: Diagnosis not present

## 2023-06-24 LAB — RAD ONC ARIA SESSION SUMMARY
Course Elapsed Days: 28
Plan Fractions Treated to Date: 17
Plan Prescribed Dose Per Fraction: 2 Gy
Plan Total Fractions Prescribed: 25
Plan Total Prescribed Dose: 50 Gy
Reference Point Dosage Given to Date: 34 Gy
Reference Point Session Dosage Given: 2 Gy
Session Number: 17

## 2023-06-25 ENCOUNTER — Encounter

## 2023-06-25 ENCOUNTER — Inpatient Hospital Stay

## 2023-06-25 ENCOUNTER — Encounter: Payer: Self-pay | Admitting: Oncology

## 2023-06-25 ENCOUNTER — Other Ambulatory Visit: Payer: Self-pay

## 2023-06-25 ENCOUNTER — Inpatient Hospital Stay (HOSPITAL_BASED_OUTPATIENT_CLINIC_OR_DEPARTMENT_OTHER): Admitting: Oncology

## 2023-06-25 ENCOUNTER — Ambulatory Visit
Admission: RE | Admit: 2023-06-25 | Discharge: 2023-06-25 | Disposition: A | Discharge status: Home / Self Care | Source: Ambulatory Visit | Attending: Radiation Oncology | Admitting: Radiation Oncology

## 2023-06-25 VITALS — BP 111/54 | HR 91 | Temp 96.9°F | Resp 18 | Ht 63.0 in | Wt 89.0 lb

## 2023-06-25 VITALS — BP 113/57 | HR 76 | Temp 96.9°F | Resp 19

## 2023-06-25 DIAGNOSIS — Z9013 Acquired absence of bilateral breasts and nipples: Secondary | ICD-10-CM | POA: Insufficient documentation

## 2023-06-25 DIAGNOSIS — C50412 Malignant neoplasm of upper-outer quadrant of left female breast: Secondary | ICD-10-CM | POA: Diagnosis not present

## 2023-06-25 DIAGNOSIS — R197 Diarrhea, unspecified: Secondary | ICD-10-CM | POA: Insufficient documentation

## 2023-06-25 DIAGNOSIS — Z5112 Encounter for antineoplastic immunotherapy: Secondary | ICD-10-CM | POA: Insufficient documentation

## 2023-06-25 DIAGNOSIS — Z170 Estrogen receptor positive status [ER+]: Secondary | ICD-10-CM

## 2023-06-25 DIAGNOSIS — Z1501 Genetic susceptibility to malignant neoplasm of breast: Secondary | ICD-10-CM | POA: Insufficient documentation

## 2023-06-25 DIAGNOSIS — Z90710 Acquired absence of both cervix and uterus: Secondary | ICD-10-CM | POA: Insufficient documentation

## 2023-06-25 DIAGNOSIS — Z1721 Progesterone receptor positive status: Secondary | ICD-10-CM | POA: Insufficient documentation

## 2023-06-25 DIAGNOSIS — Z800 Family history of malignant neoplasm of digestive organs: Secondary | ICD-10-CM | POA: Insufficient documentation

## 2023-06-25 DIAGNOSIS — D279 Benign neoplasm of unspecified ovary: Secondary | ICD-10-CM | POA: Insufficient documentation

## 2023-06-25 DIAGNOSIS — Z803 Family history of malignant neoplasm of breast: Secondary | ICD-10-CM | POA: Insufficient documentation

## 2023-06-25 DIAGNOSIS — Z90722 Acquired absence of ovaries, bilateral: Secondary | ICD-10-CM | POA: Insufficient documentation

## 2023-06-25 LAB — CBC WITH DIFFERENTIAL/PLATELET
Abs Immature Granulocytes: 0.02 10*3/uL (ref 0.00–0.07)
Basophils Absolute: 0 10*3/uL (ref 0.0–0.1)
Basophils Relative: 0 %
Eosinophils Absolute: 0 10*3/uL (ref 0.0–0.5)
Eosinophils Relative: 1 %
HCT: 36.5 % (ref 36.0–46.0)
Hemoglobin: 12.2 g/dL (ref 12.0–15.0)
Immature Granulocytes: 0 %
Lymphocytes Relative: 12 %
Lymphs Abs: 0.6 10*3/uL — ABNORMAL LOW (ref 0.7–4.0)
MCH: 29.4 pg (ref 26.0–34.0)
MCHC: 33.4 g/dL (ref 30.0–36.0)
MCV: 88 fL (ref 80.0–100.0)
Monocytes Absolute: 0.5 10*3/uL (ref 0.1–1.0)
Monocytes Relative: 8 %
Neutro Abs: 4.3 10*3/uL (ref 1.7–7.7)
Neutrophils Relative %: 79 %
Platelets: 156 10*3/uL (ref 150–400)
RBC: 4.15 MIL/uL (ref 3.87–5.11)
RDW: 13.2 % (ref 11.5–15.5)
WBC: 5.5 10*3/uL (ref 4.0–10.5)
nRBC: 0 % (ref 0.0–0.2)

## 2023-06-25 LAB — CMP (CANCER CENTER ONLY)
ALT: 20 U/L (ref 0–44)
AST: 19 U/L (ref 15–41)
Albumin: 4.1 g/dL (ref 3.5–5.0)
Alkaline Phosphatase: 63 U/L (ref 38–126)
Anion gap: 8 (ref 5–15)
BUN: 16 mg/dL (ref 6–20)
CO2: 26 mmol/L (ref 22–32)
Calcium: 8.9 mg/dL (ref 8.9–10.3)
Chloride: 103 mmol/L (ref 98–111)
Creatinine: 0.64 mg/dL (ref 0.44–1.00)
GFR, Estimated: >60 mL/min (ref >60)
Glucose, Bld: 97 mg/dL (ref 70–99)
Potassium: 3.4 mmol/L — ABNORMAL LOW (ref 3.5–5.1)
Sodium: 137 mmol/L (ref 135–145)
Total Bilirubin: 0.8 mg/dL (ref 0.0–1.2)
Total Protein: 6.6 g/dL (ref 6.5–8.1)

## 2023-06-25 LAB — RAD ONC ARIA SESSION SUMMARY
Course Elapsed Days: 29
Plan Fractions Treated to Date: 18
Plan Prescribed Dose Per Fraction: 2 Gy
Plan Total Fractions Prescribed: 25
Plan Total Prescribed Dose: 50 Gy
Reference Point Dosage Given to Date: 36 Gy
Reference Point Session Dosage Given: 2 Gy
Session Number: 18

## 2023-06-25 MED ORDER — SODIUM CHLORIDE 0.9% FLUSH
10.0000 mL | INTRAVENOUS | Status: AC | PRN
  Administered 2023-06-25: 10 mL
  Filled 2023-06-25: qty 10

## 2023-06-25 MED ORDER — ACETAMINOPHEN 325 MG PO TABS
650.0000 mg | ORAL_TABLET | Freq: Once | ORAL | Status: AC
  Administered 2023-06-25: 650 mg via ORAL
  Filled 2023-06-25: qty 2

## 2023-06-25 MED ORDER — CETIRIZINE HCL 10 MG/ML IV SOLN
10.0000 mg | Freq: Once | INTRAVENOUS | Status: AC
  Administered 2023-06-25: 10 mg via INTRAVENOUS
  Filled 2023-06-25: qty 1

## 2023-06-25 MED ORDER — METHYLPREDNISOLONE SODIUM SUCC 125 MG IJ SOLR
125.0000 mg | Freq: Once | INTRAMUSCULAR | Status: AC
  Administered 2023-06-25: 125 mg via INTRAVENOUS
  Filled 2023-06-25: qty 2

## 2023-06-25 MED ORDER — FAMOTIDINE IN NACL 20-0.9 MG/50ML-% IV SOLN
20.0000 mg | Freq: Once | INTRAVENOUS | Status: AC
  Administered 2023-06-25: 20 mg via INTRAVENOUS
  Filled 2023-06-25: qty 50

## 2023-06-25 MED ORDER — SODIUM CHLORIDE 0.9 % IV SOLN
INTRAVENOUS | Status: AC
  Filled 2023-06-25: qty 250

## 2023-06-25 MED ORDER — TRASTUZUMAB-ANNS CHEMO 150 MG IV SOLR
6.0000 mg/kg | Freq: Once | INTRAVENOUS | Status: AC
  Administered 2023-06-25: 252 mg via INTRAVENOUS
  Filled 2023-06-25: qty 12

## 2023-06-25 MED ORDER — HEPARIN SOD (PORK) LOCK FLUSH 100 UNIT/ML IV SOLN
500.0000 [IU] | Freq: Once | INTRAVENOUS | Status: AC | PRN
  Administered 2023-06-25: 500 [IU]
  Filled 2023-06-25: qty 5

## 2023-06-25 NOTE — Progress Notes (Signed)
 Nutrition Follow-up:  Patient with left breast, triple positive.  Completed TCHP chemotherapy.  Receiving herceptin  only at this time due to diarrhea from perjeta .  Met with patient during infusion.  Reports improved diarrhea with herceptin  alone.  Continues to try and add new foods to diet if stomach will allow.     Medications: reviewed  Labs: K 3.4  Anthropometrics:   Weight 89 lb  91 lb on 5/21 90 lb on 4/30 92 lb on 3/11 92 lb on 3/5 86 lb on 02/03/23   NUTRITION DIAGNOSIS: Inadequate oral intake stable   INTERVENTION:  Consider probiotic (kefir, yogurt, etc) to help with diarrhea.  Discuss with MD before taking pill form Continue to increase calories and protein    MONITORING, EVALUATION, GOAL: weight trends, intake   NEXT VISIT: July 23rd (Wednesday) during infusion  Melissa Mulroy B. Zollie Rodriguez, CSO, LDN Registered Dietitian [phone]

## 2023-06-25 NOTE — Progress Notes (Signed)
 Hematology/Oncology Consult note [facility]  [phone] [phone]  Patient Care Team: Bennet Brasil, MD as PCP - General (Family Medicine) Paulett Boros, MD as Medical Oncologist (Medical Oncology) Gerhard Knuckles, RN as Oncology Nurse Navigator (Medical Oncology) Avonne Boettcher, MD as Consulting Physician (Oncology) Rochell Chroman, RN as Oncology Nurse Navigator Glenis Langdon, MD as Consulting Physician (Radiation Oncology)   Name of the patient: Melissa Rodriguez  [id number]  Jan 18, 1965   Date of visit: 06/25/23  Diagnosis-  pathologic prognostic stage Ib invasive mammary carcinoma of the left breast T2 N1 M0 ER/PR positive HER2 positive     Chief complaint/ Reason for visit-on treatment assessment prior to cycle 11 of adjuvant Herceptin   Heme/Onc history: Patient is a 58 year old female with a prior history of lumpectomy in 2008 which showed a 7 mm stage I ER/PR positive HER2 negative left breast cancer.  She underwent lumpectomy and adjuvant radiation therapy but did not go through endocrine therapy.   She self palpated a left breast lump led to a diagnostic mammogram in July 2024.  Diagnostic mammogram showed 1.9 cm irregular hypoechoic mass 1 o'clock position 7 cm from the breast grade 3 ER 99% positive, PR 85% positive and HER2 equivocal by IHC and FISH positive.  Ki-67 35% there were calcifications noted in the right breast as well which were biopsy-proven benign.     She underwent genetic testing and was found to have BRCA2 mutation   Patient was seen by Dr. Cheree Cords and he recommended upfront surgery and consideration for adjuvant chemotherapy.  Patient underwent bilateral mastectomy on 08/15/2022.  No evidence of malignancy in the right breast.  There were 2 distinct masses noted in the left breast with dominant mass measured 2.4 x 2.3 x 1.6 cm grade 3 with negative margins.  There was another 1 cm mass where there was  uptake of radiotracer dye with positive posterior lateral anterior margin.  This was believed to be the sentinel lymph node although on pathology it states that there were no lymph nodes identified likely due to prior lumpectomy. mpT2.  She was staged as T2 N1 triple positive disease.   Patient completed 6 cycles of adjuvant TCHP chemotherapy and is presently on maintenance Herceptin  and Perjeta .  Patient found to have 7.9 cm lobulated left ovarian mass which was resected at [facility] in March 2025.  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  No pathologic diagnosis was noted in the uterus contralateral ovary and fallopian tube.  Interval history-she still has occasional diarrhea with Herceptin  but overall feels like she is tolerating single agent Herceptin  much better than she did with combination Herceptin  and Perjeta .  She is able to start back some of the old foods that she is not able to tolerate.  Weight has remained stable and her quality of life is improved  ECOG PS- 1 Pain scale- 0   Review of systems- Review of Systems  Constitutional:  Positive for malaise/fatigue. Negative for chills, fever and weight loss.  HENT:  Negative for congestion, ear discharge and nosebleeds.   Eyes:  Negative for blurred vision.  Respiratory:  Negative for cough, hemoptysis, sputum production, shortness of breath and wheezing.   Cardiovascular:  Negative for chest pain, palpitations, orthopnea and claudication.  Gastrointestinal:  Positive for diarrhea. Negative for abdominal pain, blood in stool, constipation, heartburn, melena, nausea and vomiting.  Genitourinary:  Negative for dysuria, flank pain,  frequency, hematuria and urgency.  Musculoskeletal:  Negative for back pain, joint pain and myalgias.  Skin:  Negative for rash.  Neurological:  Negative for dizziness, tingling, focal weakness, seizures, weakness and headaches.  Endo/Heme/Allergies:  Does  not bruise/bleed easily.  Psychiatric/Behavioral:  Negative for depression and suicidal ideas. The patient does not have insomnia.       Allergies  Allergen Reactions   Benadryl  [Diphenhydramine  Hcl]     Jittery and Hallucinations   Diphenhydramine  Hcl Anxiety    Other Reaction(s): Hallucination  Jittery and Hallucinations    Made her feel like she was crawling the walls.   Hydrocodone  Shortness Of Breath    Felt like she could not breathe-February 2020   Penicillins Hives and Rash    Did it involve swelling of the face/tongue/throat, SOB, or low BP? No  Did it involve sudden or severe rash/hives, skin peeling, or any reaction on the inside of your mouth or nose? No  Did you need to seek medical attention at a hospital or doctor's office? No  When did it last happen?        If all above answers are "NO", may proceed with cephalosporin use.  Any medicine related to PCN    Did it involve swelling of the face/tongue/throat, SOB, or low BP? No Did it involve sudden or severe rash/hives, skin peeling, or any reaction on the inside of your mouth or nose? No Did you need to seek medical attention at a hospital or doctor's office? No When did it last happen?       If all above answers are "NO", may proceed with cephalosporin use.   Ciprofloxacin Hives   Docetaxel  Other (See Comments)    Tingling of lips, teeth hurting, hot tongue   Doxycycline  Other (See Comments)    Mouth pain    Trastuzumab -Anns [Trastuzumab -Pkrb] Itching    Tingling and hot tongue and lips     Past Medical History:  Diagnosis Date   Adenocarcinoma of breast (HCC) 05/20/2011   Stage I (T1b N0), grade 1, well-differentiated adenocarcinoma of the medial lower quadrant of the left breast. It was a tubular carcinoma, status post lumpectomy on 03/13/2006, ER positive 79%, PR positive 92%, HER2/neu negative, Ki-67 marker low at 18%. Her cancer was 7 mm in size, and she had 4 sentinel nodes all negative along with her  lumpectomy. She was treated with radiation therapy postoper   Complication of anesthesia    pt states her bladder did not wake up after one surgery. Went to ER, had a urinary catheter for 1 week   History of kidney stones    Iron deficiency    Iron deficiency anemia 05/20/2011   Secondary to GU bleeding.  Good absorber of PO iron   Personal history of radiation therapy      Past Surgical History:  Procedure Laterality Date   BREAST BIOPSY Left 07/16/2022   US  LT BREAST BX W LOC DEV 1ST LESION IMG BX SPEC US  GUIDE 07/16/2022 Alger Infield, MD [facility]   BREAST BIOPSY Right 07/26/2022   MM RT BREAST BX W LOC DEV 1ST LESION IMAGE BX SPEC STEREO GUIDE 07/26/2022 [facility]   BREAST LUMPECTOMY Left    EXTRACORPOREAL SHOCK WAVE LITHOTRIPSY Left 02/26/2018   Procedure: EXTRACORPOREAL SHOCK WAVE LITHOTRIPSY (ESWL);  Surgeon: Marco Severs, MD;  Location: [facility];  Service: Urology;  Laterality: Left;   LUMPECTOM LEFT BREAST  02/2006   LYMPH NODE BIOPSY LEFT  04/2006   MASTECTOMY  W/ SENTINEL NODE BIOPSY Left 08/15/2022   Procedure: LEFT SIMPLE MASTECTOMY, LEFT SENTINEL LYMPH NODE MAPPING;  Surgeon: Sim Dryer, MD;  Location: [facility];  Service: General;  Laterality: Left;   PORTACATH PLACEMENT N/A 10/09/2022   Procedure: INSERTION PORT-A-CATH;  Surgeon: Sim Dryer, MD;  Location: [facility];  Service: General;  Laterality: N/A;   SIMPLE MASTECTOMY WITH AXILLARY SENTINEL NODE BIOPSY Right 08/15/2022   Procedure: RIGHT RISK REDUCING MASTECTOMY;  Surgeon: Sim Dryer, MD;  Location: [facility];  Service: General;  Laterality: Right;    Social History   Socioeconomic History   Marital status: Married    Spouse name: Not on file   Number of children: Not on file   Years of education: Not on file   Highest education level: Some college, no degree  Occupational History   Not on file  Tobacco Use   Smoking status: Never    Smokeless tobacco: Never  Vaping Use   Vaping status: Never Used  Substance and Sexual Activity   Alcohol use: No   Drug use: No   Sexual activity: Yes    Birth control/protection: Post-menopausal  Other Topics Concern   Not on file  Social History Narrative   Not on file   Social Drivers of Health   Financial Resource Strain: Patient Declined (10/16/2022)   Overall Financial Resource Strain (CARDIA)    Difficulty of Paying Living Expenses: Patient declined  Recent Concern: Physicist, medical Strain - High Risk (10/03/2022)   Overall Financial Resource Strain (CARDIA)    Difficulty of Paying Living Expenses: Hard  Food Insecurity: No Food Insecurity (11/27/2022)   Hunger Vital Sign    Worried About Running Out of Food in the Last Year: Never true    Ran Out of Food in the Last Year: Never true  Transportation Needs: No Transportation Needs (11/27/2022)   PRAPARE - Administrator, Civil Service (Medical): No    Lack of Transportation (Non-Medical): No  Physical Activity: Insufficiently Active (10/16/2022)   Exercise Vital Sign    Days of Exercise per Week: 1 day    Minutes of Exercise per Session: 20 min  Stress: No Stress Concern Present (10/16/2022)   [employer] of Occupational Health - Occupational Stress Questionnaire    Feeling of Stress : Not at all  Social Connections: Unknown (10/16/2022)   Social Connection and Isolation Panel [NHANES]    Frequency of Communication with Friends and Family: Patient declined    Frequency of Social Gatherings with Friends and Family: Patient declined    Attends Religious Services: More than 4 times per year    Active Member of [employer] or Organizations: Yes    Attends Engineer, structural: More than 4 times per year    Marital Status: Married  Catering manager Violence: Not At Risk (11/27/2022)   Humiliation, Afraid, Rape, and Kick questionnaire    Fear of Current or Ex-Partner: No    Emotionally Abused: No     Physically Abused: No    Sexually Abused: No    Family History  Problem Relation Age of Onset   Hypertension Mother    Hypertension Father    Heart disease Father 75       MI in his 63s   Cancer Maternal Aunt        unk type, possibly hip. dx >50   Breast cancer Paternal Aunt        dx 73s   Cancer  Paternal Aunt        unknown, d. 36s   Prostate cancer Cousin        dx 67s-50s   Stomach cancer Cousin        dx 64s-50s     Current Outpatient Medications:    aluminum -magnesium  hydroxide 200-200 MG/5ML suspension, Take 15 mLs by mouth every 6 (six) hours as needed for indigestion. Mix 1:1 with Lidocaine  and swish and spit 4 times a day, Disp: 355 mL, Rfl: 2   Bioflavonoid Products (GRAPE SEED PO), Take 1 capsule by mouth daily., Disp: , Rfl:    Calcium Carbonate Antacid (TUMS PO), Take 1 tablet by mouth as needed. Reported on 05/22/2015, Disp: , Rfl:    CARBOPLATIN  IV, Inject into the vein every 21 ( twenty-one) days., Disp: , Rfl:    Cholecalciferol (VITAMIN D  PO), Take 1,000 Units by mouth daily., Disp: , Rfl:    diphenoxylate -atropine  (LOMOTIL ) 2.5-0.025 MG tablet, Take 1 tablet by mouth 4 (four) times daily as needed for diarrhea or loose stools., Disp: 60 tablet, Rfl: 0   DOCETAXEL  IV, Inject into the vein every 21 ( twenty-one) days., Disp: , Rfl:    lidocaine  (XYLOCAINE ) 2 % solution, Use as directed 15 mLs in the mouth or throat 4 (four) times daily. Mix 1:1 with maalox and swish and spit 4 times a day, Disp: 100 mL, Rfl: 2   lidocaine -prilocaine  (EMLA ) cream, Apply a quarter-sized amount to port a cath site and cover with plastic wrap 1 hour prior to infusion appointments, Disp: 30 g, Rfl: 3   loratadine  (CLARITIN ) 5 MG chewable tablet, Chew 5 mg by mouth daily., Disp: , Rfl:    montelukast  (SINGULAIR ) 10 MG tablet, Take one 10mg  tab for 2 days before chemo (day 1 and day 2), Disp: 20 tablet, Rfl: 0   Multiple Vitamin (MULTI-VITAMIN) tablet, Take 1 tablet by mouth daily., Disp:  , Rfl:    ondansetron  (ZOFRAN ) 8 MG tablet, Take 1 tablet (8 mg total) by mouth every 8 (eight) hours as needed for nausea or vomiting., Disp: 20 tablet, Rfl: 0   PERTUZUMAB  IV, Inject into the vein every 21 ( twenty-one) days., Disp: , Rfl:    predniSONE  (DELTASONE ) 20 MG tablet, Take one 20mg  tab for 2 days before chemo (day 1 and day 2), Disp: 20 tablet, Rfl: 0   TRASTUZUMAB  IV, Inject into the vein every 21 ( twenty-one) days., Disp: , Rfl:  No current facility-administered medications for this visit.  Facility-Administered Medications Ordered in Other Visits:    0.9 %  sodium chloride  infusion, , Intravenous, Continuous, Avonne Boettcher, MD, Last Rate: 10 mL/hr at 06/25/23 1101, New Bag at 06/25/23 1101   sodium chloride  flush (NS) 0.9 % injection 10 mL, 10 mL, Intracatheter, PRN, Avonne Boettcher, MD, 10 mL at 06/25/23 1101  Physical exam:  Vitals:   06/25/23 1024  BP: (!) 111/54  Pulse: 91  Resp: 18  Temp: (!) 96.9 F (36.1 C)  TempSrc: Tympanic  SpO2: 100%  Weight: 89 lb (40.4 kg)  Height: 5' 3 (1.6 m)   Physical Exam Cardiovascular:     Rate and Rhythm: Normal rate and regular rhythm.     Heart sounds: Normal heart sounds.  Pulmonary:     Effort: Pulmonary effort is normal.     Breath sounds: Normal breath sounds.  Skin:    General: Skin is warm and dry.  Neurological:     Mental Status: She is alert and oriented  to person, place, and time.      I have personally reviewed labs listed below:    Latest Ref Rng & Units 06/25/2023    9:59 AM  CMP  Glucose 70 - 99 mg/dL 97   BUN 6 - 20 mg/dL 16   Creatinine 1.61 - 1.00 mg/dL 0.96   Sodium 045 - 409 mmol/L 137   Potassium 3.5 - 5.1 mmol/L 3.4   Chloride 98 - 111 mmol/L 103   CO2 22 - 32 mmol/L 26   Calcium 8.9 - 10.3 mg/dL 8.9   Total Protein 6.5 - 8.1 g/dL 6.6   Total Bilirubin 0.0 - 1.2 mg/dL 0.8   Alkaline Phos 38 - 126 U/L 63   AST 15 - 41 U/L 19   ALT 0 - 44 U/L 20       Latest Ref Rng & Units 06/25/2023     9:59 AM  CBC  WBC 4.0 - 10.5 K/uL 5.5   Hemoglobin 12.0 - 15.0 g/dL 81.1   Hematocrit 91.4 - 46.0 % 36.5   Platelets 150 - 400 K/uL 156      Assessment and plan- Patient is a 58 y.o. female  with history of BRCA2 mutation and second left breast cancer stage Ib MP T2 N0 versus pT1 cN1 M0 ER/PR positive HER2 positive.  She is here for on treatment assessment prior to cycle  11 of adjuvant Herceptin   Counts okay to proceed with cycle 11 of adjuvant Herceptin  today.  We dropped perjeta  after cycle 9 due to persistent diarrhea and worsening quality of life.  Plan is to complete adjuvant therapy ending sometime in September 2025.  She is due for a repeat echocardiogram which will need to be scheduled in [place] as per her insurance requirements.  Patient is completing adjuvant radiation therapy soon and I will discuss endocrine therapy with her when I see her in 3 weeks.  She has baseline osteoporosis which will need to be taken into consideration while deciding between options for endocrine therapy.   Visit Diagnosis 1. Malignant neoplasm of upper-outer quadrant of left breast in female, estrogen receptor positive (HCC)   2. Encounter for monoclonal antibody treatment for malignancy      Dr. Seretha Dance, MD, MPH [facility] at [facility] [phone] 06/25/2023 1:10 PM

## 2023-06-26 ENCOUNTER — Encounter: Payer: Self-pay | Admitting: Oncology

## 2023-06-26 ENCOUNTER — Ambulatory Visit
Admission: RE | Admit: 2023-06-26 | Discharge: 2023-06-26 | Disposition: A | Discharge status: Home / Self Care | Source: Ambulatory Visit | Attending: Radiation Oncology | Admitting: Radiation Oncology

## 2023-06-26 ENCOUNTER — Telehealth: Payer: Self-pay

## 2023-06-26 ENCOUNTER — Other Ambulatory Visit: Payer: Self-pay

## 2023-06-26 DIAGNOSIS — C50412 Malignant neoplasm of upper-outer quadrant of left female breast: Secondary | ICD-10-CM | POA: Diagnosis not present

## 2023-06-26 LAB — RAD ONC ARIA SESSION SUMMARY
Course Elapsed Days: 30
Plan Fractions Treated to Date: 19
Plan Prescribed Dose Per Fraction: 2 Gy
Plan Total Fractions Prescribed: 25
Plan Total Prescribed Dose: 50 Gy
Reference Point Dosage Given to Date: 38 Gy
Reference Point Session Dosage Given: 2 Gy
Session Number: 19

## 2023-06-26 NOTE — Telephone Encounter (Signed)
 Dr. Randy Buttery, patient would like echocardiogram due ASAP.  Patient is under spouse's insurance and would like echo done at same place CT scan was done, [facility] in [place], [place].   Spoke to [place] who then transferred me to [facility] who handles echos.  Orelia Binet indicated the soonest they can do the echo is 07/15/23.  Will touch base w/ Dr. Randy Buttery to determine if this date will suffice.  Orelia Binet indicated if we'd like to move forward to fax the order, demographics, and authorization approval to [phone].  Contact phone number for Orelia Binet is [phone].

## 2023-06-27 ENCOUNTER — Ambulatory Visit
Admission: RE | Admit: 2023-06-27 | Discharge: 2023-06-27 | Disposition: A | Discharge status: Home / Self Care | Source: Ambulatory Visit | Attending: Radiation Oncology | Admitting: Radiation Oncology

## 2023-06-27 ENCOUNTER — Other Ambulatory Visit: Payer: Self-pay

## 2023-06-27 DIAGNOSIS — C50412 Malignant neoplasm of upper-outer quadrant of left female breast: Secondary | ICD-10-CM | POA: Diagnosis not present

## 2023-06-27 LAB — RAD ONC ARIA SESSION SUMMARY
Course Elapsed Days: 31
Plan Fractions Treated to Date: 20
Plan Prescribed Dose Per Fraction: 2 Gy
Plan Total Fractions Prescribed: 25
Plan Total Prescribed Dose: 50 Gy
Reference Point Dosage Given to Date: 40 Gy
Reference Point Session Dosage Given: 2 Gy
Session Number: 20

## 2023-06-30 ENCOUNTER — Ambulatory Visit
Admission: RE | Admit: 2023-06-30 | Discharge: 2023-06-30 | Disposition: A | Discharge status: Home / Self Care | Source: Ambulatory Visit | Attending: Radiation Oncology | Admitting: Radiation Oncology

## 2023-06-30 ENCOUNTER — Encounter: Payer: Self-pay | Admitting: Oncology

## 2023-06-30 ENCOUNTER — Inpatient Hospital Stay

## 2023-06-30 ENCOUNTER — Other Ambulatory Visit: Payer: Self-pay

## 2023-06-30 DIAGNOSIS — C50412 Malignant neoplasm of upper-outer quadrant of left female breast: Secondary | ICD-10-CM | POA: Diagnosis not present

## 2023-06-30 LAB — RAD ONC ARIA SESSION SUMMARY
Course Elapsed Days: 34
Plan Fractions Treated to Date: 21
Plan Prescribed Dose Per Fraction: 2 Gy
Plan Total Fractions Prescribed: 25
Plan Total Prescribed Dose: 50 Gy
Reference Point Dosage Given to Date: 42 Gy
Reference Point Session Dosage Given: 2 Gy
Session Number: 21

## 2023-06-30 NOTE — Telephone Encounter (Signed)
 Patient scheduled for echocardiogram at [facility] 07/28/23 at 10am.  No further follow up needed.

## 2023-07-01 ENCOUNTER — Ambulatory Visit
Admission: RE | Admit: 2023-07-01 | Discharge: 2023-07-01 | Disposition: A | Discharge status: Home / Self Care | Source: Ambulatory Visit | Attending: Radiation Oncology | Admitting: Radiation Oncology

## 2023-07-01 ENCOUNTER — Other Ambulatory Visit: Payer: Self-pay

## 2023-07-01 DIAGNOSIS — C50412 Malignant neoplasm of upper-outer quadrant of left female breast: Secondary | ICD-10-CM | POA: Diagnosis not present

## 2023-07-01 LAB — RAD ONC ARIA SESSION SUMMARY
Course Elapsed Days: 35
Plan Fractions Treated to Date: 22
Plan Prescribed Dose Per Fraction: 2 Gy
Plan Total Fractions Prescribed: 25
Plan Total Prescribed Dose: 50 Gy
Reference Point Dosage Given to Date: 44 Gy
Reference Point Session Dosage Given: 2 Gy
Session Number: 22

## 2023-07-02 ENCOUNTER — Other Ambulatory Visit: Payer: Self-pay

## 2023-07-02 ENCOUNTER — Ambulatory Visit
Admission: RE | Admit: 2023-07-02 | Discharge: 2023-07-02 | Disposition: A | Discharge status: Home / Self Care | Source: Ambulatory Visit | Attending: Radiation Oncology | Admitting: Radiation Oncology

## 2023-07-02 ENCOUNTER — Ambulatory Visit

## 2023-07-02 DIAGNOSIS — C50412 Malignant neoplasm of upper-outer quadrant of left female breast: Secondary | ICD-10-CM | POA: Diagnosis not present

## 2023-07-02 LAB — RAD ONC ARIA SESSION SUMMARY
Course Elapsed Days: 36
Plan Fractions Treated to Date: 23
Plan Prescribed Dose Per Fraction: 2 Gy
Plan Total Fractions Prescribed: 25
Plan Total Prescribed Dose: 50 Gy
Reference Point Dosage Given to Date: 46 Gy
Reference Point Session Dosage Given: 2 Gy
Session Number: 23

## 2023-07-03 ENCOUNTER — Ambulatory Visit
Admission: RE | Admit: 2023-07-03 | Discharge: 2023-07-03 | Disposition: A | Discharge status: Home / Self Care | Source: Ambulatory Visit | Attending: Radiation Oncology | Admitting: Radiation Oncology

## 2023-07-03 ENCOUNTER — Other Ambulatory Visit: Payer: Self-pay

## 2023-07-03 ENCOUNTER — Ambulatory Visit

## 2023-07-03 DIAGNOSIS — C50412 Malignant neoplasm of upper-outer quadrant of left female breast: Secondary | ICD-10-CM | POA: Diagnosis not present

## 2023-07-03 LAB — RAD ONC ARIA SESSION SUMMARY
Course Elapsed Days: 37
Plan Fractions Treated to Date: 24
Plan Prescribed Dose Per Fraction: 2 Gy
Plan Total Fractions Prescribed: 25
Plan Total Prescribed Dose: 50 Gy
Reference Point Dosage Given to Date: 48 Gy
Reference Point Session Dosage Given: 2 Gy
Session Number: 24

## 2023-07-04 ENCOUNTER — Other Ambulatory Visit: Payer: Self-pay

## 2023-07-04 ENCOUNTER — Ambulatory Visit

## 2023-07-04 DIAGNOSIS — C50412 Malignant neoplasm of upper-outer quadrant of left female breast: Secondary | ICD-10-CM | POA: Diagnosis not present

## 2023-07-04 LAB — RAD ONC ARIA SESSION SUMMARY
Course Elapsed Days: 38
Plan Fractions Treated to Date: 25
Plan Prescribed Dose Per Fraction: 2 Gy
Plan Total Fractions Prescribed: 25
Plan Total Prescribed Dose: 50 Gy
Reference Point Dosage Given to Date: 50 Gy
Reference Point Session Dosage Given: 2 Gy
Session Number: 25

## 2023-07-07 ENCOUNTER — Ambulatory Visit
Admission: RE | Admit: 2023-07-07 | Discharge: 2023-07-07 | Disposition: A | Discharge status: Home / Self Care | Source: Ambulatory Visit | Attending: Radiation Oncology | Admitting: Radiation Oncology

## 2023-07-07 ENCOUNTER — Other Ambulatory Visit: Payer: Self-pay

## 2023-07-07 ENCOUNTER — Ambulatory Visit

## 2023-07-07 DIAGNOSIS — C50412 Malignant neoplasm of upper-outer quadrant of left female breast: Secondary | ICD-10-CM | POA: Diagnosis not present

## 2023-07-07 LAB — RAD ONC ARIA SESSION SUMMARY
Course Elapsed Days: 41
Plan Fractions Treated to Date: 1
Plan Prescribed Dose Per Fraction: 2 Gy
Plan Total Fractions Prescribed: 5
Plan Total Prescribed Dose: 10 Gy
Reference Point Dosage Given to Date: 2 Gy
Reference Point Session Dosage Given: 2 Gy
Session Number: 26

## 2023-07-08 ENCOUNTER — Ambulatory Visit

## 2023-07-08 ENCOUNTER — Other Ambulatory Visit: Payer: Self-pay

## 2023-07-08 ENCOUNTER — Ambulatory Visit
Admission: RE | Admit: 2023-07-08 | Discharge: 2023-07-08 | Disposition: A | Discharge status: Home / Self Care | Source: Ambulatory Visit | Attending: Radiation Oncology | Admitting: Radiation Oncology

## 2023-07-08 DIAGNOSIS — C50412 Malignant neoplasm of upper-outer quadrant of left female breast: Secondary | ICD-10-CM | POA: Diagnosis not present

## 2023-07-08 LAB — RAD ONC ARIA SESSION SUMMARY
Course Elapsed Days: 42
Plan Fractions Treated to Date: 2
Plan Prescribed Dose Per Fraction: 2 Gy
Plan Total Fractions Prescribed: 5
Plan Total Prescribed Dose: 10 Gy
Reference Point Dosage Given to Date: 4 Gy
Reference Point Session Dosage Given: 2 Gy
Session Number: 27

## 2023-07-09 ENCOUNTER — Ambulatory Visit

## 2023-07-09 ENCOUNTER — Other Ambulatory Visit: Payer: Self-pay

## 2023-07-09 ENCOUNTER — Ambulatory Visit
Admission: RE | Admit: 2023-07-09 | Discharge: 2023-07-09 | Disposition: A | Discharge status: Home / Self Care | Source: Ambulatory Visit | Attending: Radiation Oncology | Admitting: Radiation Oncology

## 2023-07-09 DIAGNOSIS — C50412 Malignant neoplasm of upper-outer quadrant of left female breast: Secondary | ICD-10-CM | POA: Diagnosis not present

## 2023-07-09 LAB — RAD ONC ARIA SESSION SUMMARY
Course Elapsed Days: 43
Plan Fractions Treated to Date: 3
Plan Prescribed Dose Per Fraction: 2 Gy
Plan Total Fractions Prescribed: 5
Plan Total Prescribed Dose: 10 Gy
Reference Point Dosage Given to Date: 6 Gy
Reference Point Session Dosage Given: 2 Gy
Session Number: 28

## 2023-07-10 ENCOUNTER — Ambulatory Visit
Admission: RE | Admit: 2023-07-10 | Discharge: 2023-07-10 | Disposition: A | Discharge status: Home / Self Care | Source: Ambulatory Visit | Attending: Radiation Oncology | Admitting: Radiation Oncology

## 2023-07-10 ENCOUNTER — Ambulatory Visit

## 2023-07-10 ENCOUNTER — Other Ambulatory Visit: Payer: Self-pay

## 2023-07-10 DIAGNOSIS — C50412 Malignant neoplasm of upper-outer quadrant of left female breast: Secondary | ICD-10-CM | POA: Diagnosis not present

## 2023-07-10 LAB — RAD ONC ARIA SESSION SUMMARY
Course Elapsed Days: 44
Plan Fractions Treated to Date: 4
Plan Prescribed Dose Per Fraction: 2 Gy
Plan Total Fractions Prescribed: 5
Plan Total Prescribed Dose: 10 Gy
Reference Point Dosage Given to Date: 8 Gy
Reference Point Session Dosage Given: 2 Gy
Session Number: 29

## 2023-07-11 ENCOUNTER — Ambulatory Visit

## 2023-07-11 ENCOUNTER — Other Ambulatory Visit: Payer: Self-pay

## 2023-07-11 DIAGNOSIS — C50412 Malignant neoplasm of upper-outer quadrant of left female breast: Secondary | ICD-10-CM | POA: Diagnosis not present

## 2023-07-11 LAB — RAD ONC ARIA SESSION SUMMARY
Course Elapsed Days: 45
Plan Fractions Treated to Date: 5
Plan Prescribed Dose Per Fraction: 2 Gy
Plan Total Fractions Prescribed: 5
Plan Total Prescribed Dose: 10 Gy
Reference Point Dosage Given to Date: 10 Gy
Reference Point Session Dosage Given: 2 Gy
Session Number: 30

## 2023-07-14 ENCOUNTER — Ambulatory Visit

## 2023-07-14 NOTE — Radiation Completion Notes (Signed)
 Patient Name: Melissa Rodriguez, Melissa Rodriguez MRN: [id number] Date of Birth: [birth year] Referring Physician: GLENDIA FIELDING, M.D. Date of Service: 2023-07-14 Radiation Oncologist: Marcey Penton, M.D. [facility]                             RADIATION ONCOLOGY END OF TREATMENT NOTE     Diagnosis: C50.412 Malignant neoplasm of upper-outer quadrant of left female breast Staging on 2011-05-20: Adenocarcinoma of breast (HCC) T=T1b, N=N0, M=cM0 Staging on 2022-07-31: Breast cancer of upper-outer quadrant of left female breast (HCC) T=cT2, N=cN1, M=cM0 Intent: Curative     HPI: Patient is a 58 year old female treated back in 2008 for 7 mm stage I ER/PR positive HER2 negative left breast cancer she underwent lumpectomy and adjuvant radiation therapy although declined endocrine therapy.  She now presented with a palpated mass in the left breast mammogram showed a 2 cm irregular hypoechoic mass 1 o'clock position 7 cm from the nipple.  Tumor was ER/PR positive and HER2/neu overexpressed.  She on genetic testing was BRCA2 mutation positive.  She underwent bilateral mastectomies no malignancy in right breast.  There were 2 separate masses in the left breast the largest measuring 2.4 x 2.3 x 1.6 cm.  There is a separate 1 cm mass thought to be a sentinel node although there was no lymphoid tissue on pathology.  For the large mass margins were clear although the smaller mass margin was positive posteriorly.  There is also DCIS present and margin was close at 0.1 mm there were no sentinel lymph nodes submitted.  Again tumor was ER/PR positive HER2/neu overexpressed.  Patient has been receiving TCHP chemotherapy.  They did find an 8 cm lobulated left ovarian mass which was resected at [facility] and final pathology showed no evidence of malignancy just a cellular fibroma.  She has completed cycle 9 of adjuvant Herceptin  and Perjeta .  She she has been having some GI issues and Perjeta  has been on hold.  She is seen  today for consideration of left chest wall peripheral lymphatic radiation based on the poor prognostic factors including 2 separate masses no lymph nodes sampled and triple positive tumor.  She is otherwise doing well specifically [place] chest wall tenderness cough or bone pain.      ==========DELIVERED PLANS==========  First Treatment Date: 2023-05-27 Last Treatment Date: 2023-07-11   Plan Name: CW_L_BH Site: Chest Wall, Left Technique: 3D Mode: Photon Dose Per Fraction: 2 Gy Prescribed Dose (Delivered / Prescribed): 50 Gy / 50 Gy Prescribed Fxs (Delivered / Prescribed): 25 / 25   Plan Name: [id number] Site: Chest Wall, Left Technique: Electron Mode: Electron Dose Per Fraction: 2 Gy Prescribed Dose (Delivered / Prescribed): 10 Gy / 10 Gy Prescribed Fxs (Delivered / Prescribed): 5 / 5     ==========ON TREATMENT VISIT DATES========== 2023-05-27, 2023-06-03, 2023-06-10, 2023-06-17, 2023-06-24, 2023-07-01, 2023-07-08     ==========UPCOMING VISITS==========       ==========APPENDIX - ON TREATMENT VISIT NOTES==========   See weekly On Treatment Notes in Epic for details in the Media tab (listed as Progress notes on the On Treatment Visit Dates listed above).

## 2023-07-16 ENCOUNTER — Inpatient Hospital Stay: Attending: Hematology | Admitting: Oncology

## 2023-07-16 ENCOUNTER — Encounter: Payer: Self-pay | Admitting: Oncology

## 2023-07-16 ENCOUNTER — Inpatient Hospital Stay

## 2023-07-16 VITALS — BP 125/77 | HR 92 | Temp 97.9°F | Resp 16 | Wt 88.9 lb

## 2023-07-16 VITALS — BP 125/60 | HR 83 | Resp 16

## 2023-07-16 DIAGNOSIS — Z5112 Encounter for antineoplastic immunotherapy: Secondary | ICD-10-CM | POA: Diagnosis present

## 2023-07-16 DIAGNOSIS — Z1731 Human epidermal growth factor receptor 2 positive status: Secondary | ICD-10-CM | POA: Insufficient documentation

## 2023-07-16 DIAGNOSIS — Z170 Estrogen receptor positive status [ER+]: Secondary | ICD-10-CM | POA: Insufficient documentation

## 2023-07-16 DIAGNOSIS — N390 Urinary tract infection, site not specified: Secondary | ICD-10-CM | POA: Diagnosis not present

## 2023-07-16 DIAGNOSIS — C50412 Malignant neoplasm of upper-outer quadrant of left female breast: Secondary | ICD-10-CM | POA: Insufficient documentation

## 2023-07-16 DIAGNOSIS — Z1509 Genetic susceptibility to other malignant neoplasm: Secondary | ICD-10-CM | POA: Insufficient documentation

## 2023-07-16 DIAGNOSIS — Z90710 Acquired absence of both cervix and uterus: Secondary | ICD-10-CM | POA: Diagnosis not present

## 2023-07-16 DIAGNOSIS — M818 Other osteoporosis without current pathological fracture: Secondary | ICD-10-CM | POA: Diagnosis not present

## 2023-07-16 DIAGNOSIS — Z90722 Acquired absence of ovaries, bilateral: Secondary | ICD-10-CM | POA: Insufficient documentation

## 2023-07-16 DIAGNOSIS — Z1501 Genetic susceptibility to malignant neoplasm of breast: Secondary | ICD-10-CM | POA: Diagnosis not present

## 2023-07-16 DIAGNOSIS — Z1721 Progesterone receptor positive status: Secondary | ICD-10-CM | POA: Insufficient documentation

## 2023-07-16 DIAGNOSIS — D3912 Neoplasm of uncertain behavior of left ovary: Secondary | ICD-10-CM | POA: Insufficient documentation

## 2023-07-16 DIAGNOSIS — Z9013 Acquired absence of bilateral breasts and nipples: Secondary | ICD-10-CM | POA: Insufficient documentation

## 2023-07-16 MED ORDER — CETIRIZINE HCL 10 MG/ML IV SOLN
10.0000 mg | Freq: Once | INTRAVENOUS | Status: AC
  Administered 2023-07-16: 10 mg via INTRAVENOUS
  Filled 2023-07-16: qty 1

## 2023-07-16 MED ORDER — HEPARIN SOD (PORK) LOCK FLUSH 100 UNIT/ML IV SOLN
500.0000 [IU] | Freq: Once | INTRAVENOUS | Status: AC | PRN
  Administered 2023-07-16: 500 [IU]
  Filled 2023-07-16: qty 5

## 2023-07-16 MED ORDER — ACETAMINOPHEN 325 MG PO TABS
650.0000 mg | ORAL_TABLET | Freq: Once | ORAL | Status: AC
  Administered 2023-07-16: 650 mg via ORAL
  Filled 2023-07-16: qty 2

## 2023-07-16 MED ORDER — TRASTUZUMAB-ANNS CHEMO 150 MG IV SOLR
6.0000 mg/kg | Freq: Once | INTRAVENOUS | Status: AC
  Administered 2023-07-16: 252 mg via INTRAVENOUS
  Filled 2023-07-16: qty 12

## 2023-07-16 MED ORDER — FAMOTIDINE IN NACL 20-0.9 MG/50ML-% IV SOLN
20.0000 mg | Freq: Once | INTRAVENOUS | Status: AC
  Administered 2023-07-16: 20 mg via INTRAVENOUS
  Filled 2023-07-16: qty 50

## 2023-07-16 MED ORDER — TAMOXIFEN CITRATE 20 MG PO TABS: 20.0000 mg | ORAL_TABLET | Freq: Every day | ORAL | 3 refills | Status: DC

## 2023-07-16 MED ORDER — METHYLPREDNISOLONE SODIUM SUCC 125 MG IJ SOLR
125.0000 mg | Freq: Once | INTRAMUSCULAR | Status: AC
  Administered 2023-07-16: 125 mg via INTRAVENOUS
  Filled 2023-07-16: qty 2

## 2023-07-16 MED ORDER — SODIUM CHLORIDE 0.9 % IV SOLN
INTRAVENOUS | Status: DC
Start: 2023-07-16 — End: 2023-07-16
  Filled 2023-07-16: qty 250

## 2023-07-16 NOTE — Progress Notes (Signed)
 Hematology/Oncology Consult note [facility]  [phone] [phone]  Patient Care Team: Alphonsa Glendia LABOR, MD as PCP - General (Family Medicine) Rogers Hai, MD as Medical Oncologist (Medical Oncology) Celestia Joesph SQUIBB, RN as Oncology Nurse Navigator (Medical Oncology) Melanee Annah BROCKS, MD as Consulting Physician (Oncology) Maurie Rayfield BIRCH, RN as Oncology Nurse Navigator Lenn Aran, MD as Consulting Physician (Radiation Oncology)   Name of the patient: Melissa Rodriguez  [id number]  07-06-1965   Date of visit: 07/16/23  Diagnosis-  pathologic prognostic stage Ib invasive mammary carcinoma of the left breast T2 N1 M0 ER/PR positive HER2 positive   Chief complaint/ Reason for visit-on treatment assessment prior to cycle 12 of adjuvant Herceptin   Heme/Onc history: Patient is a 58 year old female with a prior history of lumpectomy in 2008 which showed a 7 mm stage I ER/PR positive HER2 negative left breast cancer.  She underwent lumpectomy and adjuvant radiation therapy but did not go through endocrine therapy.   She self palpated a left breast lump led to a diagnostic mammogram in July 2024.  Diagnostic mammogram showed 1.9 cm irregular hypoechoic mass 1 o'clock position 7 cm from the breast grade 3 ER 99% positive, PR 85% positive and HER2 equivocal by IHC and FISH positive.  Ki-67 35% there were calcifications noted in the right breast as well which were biopsy-proven benign.     She underwent genetic testing and was found to have BRCA2 mutation   Patient was seen by Dr. Rogers and he recommended upfront surgery and consideration for adjuvant chemotherapy.  Patient underwent bilateral mastectomy on 08/15/2022.  No evidence of malignancy in the right breast.  There were 2 distinct masses noted in the left breast with dominant mass measured 2.4 x 2.3 x 1.6 cm grade 3 with negative margins.  There was another 1 cm mass where there was  uptake of radiotracer dye with positive posterior lateral anterior margin.  This was believed to be the sentinel lymph node although on pathology it states that there were no lymph nodes identified likely due to prior lumpectomy. mpT2.  She was staged as T2 N1 triple positive disease.  Patient completed 6 cycles of adjuvant TCHP chemotherapy and is currently on maintenance Herceptin  only.  She could not tolerate dual Herceptin  and Perjeta .  She completed adjuvant radiation   Patient completed 6 cycles of adjuvant TCHP chemotherapy and is presently on maintenance Herceptin  and Perjeta .  Patient found to have 7.9 cm lobulated left ovarian mass which was resected at [facility] in March 2025.  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  No pathologic diagnosis was noted in the uterus contralateral ovary and fallopian tube.    Interval history-patient feels that her quality of life has improved after stopping perjeta .  She tolerated adjuvant radiation well without any significant side effects  ECOG PS- 1 Pain scale- 0   Review of systems- Review of Systems  Constitutional:  Positive for malaise/fatigue. Negative for chills, fever and weight loss.  HENT:  Negative for congestion, ear discharge and nosebleeds.   Eyes:  Negative for blurred vision.  Respiratory:  Negative for cough, hemoptysis, sputum production, shortness of breath and wheezing.   Cardiovascular:  Negative for chest pain, palpitations, orthopnea and claudication.  Gastrointestinal:  Negative for abdominal pain, blood in stool, constipation, diarrhea, heartburn, melena, nausea and vomiting.  Genitourinary:  Negative for dysuria, flank pain, frequency, hematuria and urgency.  Musculoskeletal:  Negative for back pain, joint pain and myalgias.  Skin:  Negative for rash.  Neurological:  Negative for dizziness, tingling, focal weakness, seizures, weakness and headaches.   Endo/Heme/Allergies:  Does not bruise/bleed easily.  Psychiatric/Behavioral:  Negative for depression and suicidal ideas. The patient does not have insomnia.       Allergies  Allergen Reactions   Benadryl  [Diphenhydramine  Hcl]     Jittery and Hallucinations   Diphenhydramine  Hcl Anxiety    Other Reaction(s): Hallucination  Jittery and Hallucinations    Made her feel like she was crawling the walls.   Hydrocodone  Shortness Of Breath    Felt like she could not breathe-February 2020   Penicillins Hives and Rash    Did it involve swelling of the face/tongue/throat, SOB, or low BP? No  Did it involve sudden or severe rash/hives, skin peeling, or any reaction on the inside of your mouth or nose? No  Did you need to seek medical attention at a hospital or doctor's office? No  When did it last happen?        If all above answers are "NO", may proceed with cephalosporin use.  Any medicine related to PCN    Did it involve swelling of the face/tongue/throat, SOB, or low BP? No Did it involve sudden or severe rash/hives, skin peeling, or any reaction on the inside of your mouth or nose? No Did you need to seek medical attention at a hospital or doctor's office? No When did it last happen?       If all above answers are "NO", may proceed with cephalosporin use.   Ciprofloxacin Hives   Docetaxel  Other (See Comments)    Tingling of lips, teeth hurting, hot tongue   Doxycycline  Other (See Comments)    Mouth pain    Trastuzumab -Anns [Trastuzumab -Pkrb] Itching    Tingling and hot tongue and lips     Past Medical History:  Diagnosis Date   Adenocarcinoma of breast (HCC) 05/20/2011   Stage I (T1b N0), grade 1, well-differentiated adenocarcinoma of the medial lower quadrant of the left breast. It was a tubular carcinoma, status post lumpectomy on 03/13/2006, ER positive 79%, PR positive 92%, HER2/neu negative, Ki-67 marker low at 18%. Her cancer was 7 mm in size, and she had 4 sentinel nodes  all negative along with her lumpectomy. She was treated with radiation therapy postoper   Complication of anesthesia    pt states her bladder did not wake up after one surgery. Went to ER, had a urinary catheter for 1 week   History of kidney stones    Iron deficiency    Iron deficiency anemia 05/20/2011   Secondary to GU bleeding.  Good absorber of PO iron   Personal history of radiation therapy      Past Surgical History:  Procedure Laterality Date   BREAST BIOPSY Left 07/16/2022   US  LT BREAST BX W LOC DEV 1ST LESION IMG BX SPEC US  GUIDE 07/16/2022 Lennon Nest, MD [facility]   BREAST BIOPSY Right 07/26/2022   MM RT BREAST BX W LOC DEV 1ST LESION IMAGE BX SPEC STEREO GUIDE 07/26/2022 [facility]   BREAST LUMPECTOMY Left    EXTRACORPOREAL SHOCK WAVE LITHOTRIPSY Left 02/26/2018   Procedure: EXTRACORPOREAL SHOCK WAVE LITHOTRIPSY (ESWL);  Surgeon: Sherrilee Belvie CROME, MD;  Location: [facility];  Service: Urology;  Laterality: Left;   LUMPECTOM LEFT BREAST  02/2006   LYMPH NODE BIOPSY LEFT  04/2006   MASTECTOMY W/ SENTINEL NODE BIOPSY Left 08/15/2022  Procedure: LEFT SIMPLE MASTECTOMY, LEFT SENTINEL LYMPH NODE MAPPING;  Surgeon: Vanderbilt Ned, MD;  Location: [facility];  Service: General;  Laterality: Left;   PORTACATH PLACEMENT N/A 10/09/2022   Procedure: INSERTION PORT-A-CATH;  Surgeon: Vanderbilt Ned, MD;  Location: [facility];  Service: General;  Laterality: N/A;   SIMPLE MASTECTOMY WITH AXILLARY SENTINEL NODE BIOPSY Right 08/15/2022   Procedure: RIGHT RISK REDUCING MASTECTOMY;  Surgeon: Vanderbilt Ned, MD;  Location: [facility];  Service: General;  Laterality: Right;    Social History   Socioeconomic History   Marital status: Married    Spouse name: Not on file   Number of children: Not on file   Years of education: Not on file   Highest education level: Some college, no degree  Occupational History   Not on file  Tobacco Use    Smoking status: Never   Smokeless tobacco: Never  Vaping Use   Vaping status: Never Used  Substance and Sexual Activity   Alcohol use: No   Drug use: No   Sexual activity: Yes    Birth control/protection: Post-menopausal  Other Topics Concern   Not on file  Social History Narrative   Not on file   Social Drivers of Health   Financial Resource Strain: Patient Declined (10/16/2022)   Overall Financial Resource Strain (CARDIA)    Difficulty of Paying Living Expenses: Patient declined  Recent Concern: Physicist, medical Strain - High Risk (10/03/2022)   Overall Financial Resource Strain (CARDIA)    Difficulty of Paying Living Expenses: Hard  Food Insecurity: No Food Insecurity (11/27/2022)   Hunger Vital Sign    Worried About Running Out of Food in the Last Year: Never true    Ran Out of Food in the Last Year: Never true  Transportation Needs: No Transportation Needs (11/27/2022)   PRAPARE - Administrator, Civil Service (Medical): No    Lack of Transportation (Non-Medical): No  Physical Activity: Insufficiently Active (10/16/2022)   Exercise Vital Sign    Days of Exercise per Week: 1 day    Minutes of Exercise per Session: 20 min  Stress: No Stress Concern Present (10/16/2022)   [employer] of Occupational Health - Occupational Stress Questionnaire    Feeling of Stress : Not at all  Social Connections: Unknown (10/16/2022)   Social Connection and Isolation Panel    Frequency of Communication with Friends and Family: Patient declined    Frequency of Social Gatherings with Friends and Family: Patient declined    Attends Religious Services: More than 4 times per year    Active Member of [employer] or Organizations: Yes    Attends Engineer, structural: More than 4 times per year    Marital Status: Married  Catering manager Violence: Not At Risk (11/27/2022)   Humiliation, Afraid, Rape, and Kick questionnaire    Fear of Current or Ex-Partner: No    Emotionally  Abused: No    Physically Abused: No    Sexually Abused: No    Family History  Problem Relation Age of Onset   Hypertension Mother    Hypertension Father    Heart disease Father 24       MI in his 68s   Cancer Maternal Aunt        unk type, possibly hip. dx >50   Breast cancer Paternal Aunt        dx 33s   Cancer Paternal Aunt  unknown, d. 24s   Prostate cancer Cousin        dx 40s-50s   Stomach cancer Cousin        dx 40s-50s     Current Outpatient Medications:    aluminum -magnesium  hydroxide 200-200 MG/5ML suspension, Take 15 mLs by mouth every 6 (six) hours as needed for indigestion. Mix 1:1 with Lidocaine  and swish and spit 4 times a day, Disp: 355 mL, Rfl: 2   Bioflavonoid Products (GRAPE SEED PO), Take 1 capsule by mouth daily., Disp: , Rfl:    Calcium Carbonate Antacid (TUMS PO), Take 1 tablet by mouth as needed. Reported on 05/22/2015, Disp: , Rfl:    CARBOPLATIN  IV, Inject into the vein every 21 ( twenty-one) days., Disp: , Rfl:    Cholecalciferol (VITAMIN D  PO), Take 1,000 Units by mouth daily., Disp: , Rfl:    diphenoxylate -atropine  (LOMOTIL ) 2.5-0.025 MG tablet, Take 1 tablet by mouth 4 (four) times daily as needed for diarrhea or loose stools., Disp: 60 tablet, Rfl: 0   DOCETAXEL  IV, Inject into the vein every 21 ( twenty-one) days., Disp: , Rfl:    lidocaine  (XYLOCAINE ) 2 % solution, Use as directed 15 mLs in the mouth or throat 4 (four) times daily. Mix 1:1 with maalox and swish and spit 4 times a day, Disp: 100 mL, Rfl: 2   lidocaine -prilocaine  (EMLA ) cream, Apply a quarter-sized amount to port a cath site and cover with plastic wrap 1 hour prior to infusion appointments, Disp: 30 g, Rfl: 3   loratadine  (CLARITIN ) 5 MG chewable tablet, Chew 5 mg by mouth daily., Disp: , Rfl:    montelukast  (SINGULAIR ) 10 MG tablet, Take one 10mg  tab for 2 days before chemo (day 1 and day 2), Disp: 20 tablet, Rfl: 0   Multiple Vitamin (MULTI-VITAMIN) tablet, Take 1 tablet by  mouth daily., Disp: , Rfl:    ondansetron  (ZOFRAN ) 8 MG tablet, Take 1 tablet (8 mg total) by mouth every 8 (eight) hours as needed for nausea or vomiting., Disp: 20 tablet, Rfl: 0   PERTUZUMAB  IV, Inject into the vein every 21 ( twenty-one) days., Disp: , Rfl:    predniSONE  (DELTASONE ) 20 MG tablet, Take one 20mg  tab for 2 days before chemo (day 1 and day 2), Disp: 20 tablet, Rfl: 0   tamoxifen (NOLVADEX) 20 MG tablet, Take 1 tablet (20 mg total) by mouth daily., Disp: 30 tablet, Rfl: 3   TRASTUZUMAB  IV, Inject into the vein every 21 ( twenty-one) days., Disp: , Rfl:  No current facility-administered medications for this visit.  Facility-Administered Medications Ordered in Other Visits:    0.9 %  sodium chloride  infusion, , Intravenous, Continuous, Melanee Annah BROCKS, MD, Stopped at 06/25/23 1300   0.9 %  sodium chloride  infusion, , Intravenous, Continuous, Melanee Annah BROCKS, MD, Stopped at 07/16/23 1141   sodium chloride  flush (NS) 0.9 % injection 10 mL, 10 mL, Intracatheter, PRN, Melanee Annah BROCKS, MD, 10 mL at 06/25/23 1101  Physical exam:  Vitals:   07/16/23 0934  BP: 125/77  Pulse: 92  Resp: 16  Temp: 97.9 F (36.6 C)  TempSrc: Tympanic  SpO2: 100%  Weight: 88 lb 14.4 oz (40.3 kg)   Physical Exam Cardiovascular:     Rate and Rhythm: Normal rate and regular rhythm.     Heart sounds: Normal heart sounds.  Pulmonary:     Effort: Pulmonary effort is normal.     Breath sounds: Normal breath sounds.  Skin:    General: Skin  is warm and dry.  Neurological:     Mental Status: She is alert and oriented to person, place, and time.      I have personally reviewed labs listed below:    Latest Ref Rng & Units 06/25/2023    9:59 AM  CMP  Glucose 70 - 99 mg/dL 97   BUN 6 - 20 mg/dL 16   Creatinine 9.55 - 1.00 mg/dL 9.35   Sodium 864 - 854 mmol/L 137   Potassium 3.5 - 5.1 mmol/L 3.4   Chloride 98 - 111 mmol/L 103   CO2 22 - 32 mmol/L 26   Calcium 8.9 - 10.3 mg/dL 8.9   Total Protein 6.5  - 8.1 g/dL 6.6   Total Bilirubin 0.0 - 1.2 mg/dL 0.8   Alkaline Phos 38 - 126 U/L 63   AST 15 - 41 U/L 19   ALT 0 - 44 U/L 20       Latest Ref Rng & Units 06/25/2023    9:59 AM  CBC  WBC 4.0 - 10.5 K/uL 5.5   Hemoglobin 12.0 - 15.0 g/dL 87.7   Hematocrit 63.9 - 46.0 % 36.5   Platelets 150 - 400 K/uL 156      Assessment and plan- Patient is a 58 y.o. female with history of BRCA2 mutation and second left breast cancer stage Ib MP T2 N0 versus pT1 cN1 M0 ER/PR positive HER2 positive.  She is here for on treatment assessment prior to cycle 12 of adjuvant Herceptin   Patient will proceed with cycle 12 of Herceptin  today.  She will directly proceed for cycle 13 in 3 weeks and I will see her back in 6 weeks for cycle 14.  She has echocardiogram scheduled at [facility] in a couple of weeks.  We are getting labs every other cycle.  Patient has completed radiation therapy and she has a positive disease.  She would be a candidate for hormone therapy.  We discussed risks and benefits of both tamoxifen and aromatase inhibitor.  Risk of worsening bone health with aromatase inhibitor was discussed.  Patient would like to proceed with tamoxifen at this time.  I am sending her a prescription for the same.  Patient understands potential risks of tamoxifen including all but not limited to hot flashes, mood swings, arthralgias, vaginal dryness, risk of cataracts and DVT.  Patient is status post hysterectomy and therefore does not have increased risk of uterine cancer.  We also discussed management of osteoporosis and I discussed risks versus benefits of weekly Fosamax versus parenteral bisphosphonate such as Prolia or Zometa.  Patient does not wish to take oral Fosamax due to concern for GI side effects.  Patient would like to wait to discuss with her dentist given the potential concerns for osteonecrosis of the jaw associated with Prolia and Zometa and her ongoing dental issues.  She will let us  know what she wants  to do with her osteoporosis management down the line.   Visit Diagnosis 1. Malignant neoplasm of upper-outer quadrant of left breast in female, estrogen receptor positive (HCC)   2. Encounter for monoclonal antibody treatment for malignancy   3. Other osteoporosis without current pathological fracture      Dr. Annah Skene, MD, MPH [facility] at [facility] [phone] 07/16/2023 12:53 PM

## 2023-07-16 NOTE — Patient Instructions (Signed)
 [facility] - A DEPT OF [facility]  Discharge Instructions: Thank you for choosing [facility] to provide your oncology and hematology care.  If you have a lab appointment with the Cancer Center, please go directly to the Cancer Center and check in at the registration area.  Wear comfortable clothing and clothing appropriate for easy access to any Portacath or PICC line.   We strive to give you quality time with your provider. You may need to reschedule your appointment if you arrive late (15 or more minutes).  Arriving late affects you and other patients whose appointments are after yours.  Also, if you miss three or more appointments without notifying the office, you may be dismissed from the clinic at the provider's discretion.      For prescription refill requests, have your pharmacy contact our office and allow 72 hours for refills to be completed.    Today you received the following chemotherapy and/or immunotherapy agents Kanjinti      To help prevent nausea and vomiting after your treatment, we encourage you to take your nausea medication as directed.  BELOW ARE SYMPTOMS THAT SHOULD BE REPORTED IMMEDIATELY: *FEVER GREATER THAN 100.4 F (38 C) OR HIGHER *CHILLS OR SWEATING *NAUSEA AND VOMITING THAT IS NOT CONTROLLED WITH YOUR NAUSEA MEDICATION *UNUSUAL SHORTNESS OF BREATH *UNUSUAL BRUISING OR BLEEDING *URINARY PROBLEMS (pain or burning when urinating, or frequent urination) *BOWEL PROBLEMS (unusual diarrhea, constipation, pain near the anus) TENDERNESS IN MOUTH AND THROAT WITH OR WITHOUT PRESENCE OF ULCERS (sore throat, sores in mouth, or a toothache) UNUSUAL RASH, SWELLING OR PAIN  UNUSUAL VAGINAL DISCHARGE OR ITCHING   Items with * indicate a potential emergency and should be followed up as soon as possible or go to the Emergency Department if any problems should occur.  Please show the CHEMOTHERAPY ALERT CARD or IMMUNOTHERAPY  ALERT CARD at check-in to the Emergency Department and triage nurse.  Should you have questions after your visit or need to cancel or reschedule your appointment, please contact [facility] - A DEPT OF [facility]  [phone] and follow the prompts.  Office hours are 8:00 a.m. to 4:30 p.m. Monday - Friday. Please note that voicemails left after 4:00 p.m. may not be returned until the following business day.  We are closed weekends and major holidays. You have access to a nurse at all times for urgent questions. Please call the main number to the clinic [phone] and follow the prompts.  For any non-urgent questions, you may also contact your provider using MyChart. We now offer e-Visits for anyone 3 and older to request care online for non-urgent symptoms. For details visit [email or web address].   Also download the MyChart app! Go to the app store, search "MyChart", open the app, select [place], and log in with your MyChart username and password.

## 2023-07-29 ENCOUNTER — Telehealth: Payer: Self-pay | Admitting: *Deleted

## 2023-07-29 ENCOUNTER — Encounter: Payer: Self-pay | Admitting: Oncology

## 2023-07-29 NOTE — Telephone Encounter (Signed)
 The patient says that she had just got where she feels back to herself and when she started on the tamoxifen  it started messing with her stomach and she has stomach pains and then she gets diarrhea and it could be 2-3 times in a row and then they.  She says that she is very skinny already and she had gotten a little bit of weight on her until her going on tamoxifen  and then now she is back to very small to in.  I told her that whenever Dr. Melanee has issues like this she usually takes you off for 4 weeks.  You have an appointment with her on August 13 and you will talk to her about this medication and the side effects for you.

## 2023-07-30 ENCOUNTER — Encounter: Payer: Self-pay | Admitting: Oncology

## 2023-07-30 NOTE — Telephone Encounter (Signed)
 Reviewed below message with Dr. Babara via secure chat. Per Dr. Babara ok. sounds good..  No further f/u needed at this time.

## 2023-08-04 ENCOUNTER — Telehealth: Payer: Self-pay | Admitting: *Deleted

## 2023-08-04 NOTE — Telephone Encounter (Signed)
 She feels that she has coming on with a UTI. She started having symptoms on Friday itchy , having discharge and pressure urination and frequent urinating.  Also she is supposed to get her Cepton on Wednesday and she wants to make sure that is not going to interfere as far as the  possibility of UTI and get herceptin  on wed.

## 2023-08-06 ENCOUNTER — Inpatient Hospital Stay

## 2023-08-06 ENCOUNTER — Ambulatory Visit
Admission: RE | Admit: 2023-08-06 | Discharge: 2023-08-06 | Disposition: A | Discharge status: Home / Self Care | Source: Ambulatory Visit | Attending: Radiation Oncology | Admitting: Radiation Oncology

## 2023-08-06 ENCOUNTER — Encounter: Payer: Self-pay | Admitting: Radiation Oncology

## 2023-08-06 VITALS — BP 109/46 | HR 78

## 2023-08-06 VITALS — BP 128/76 | HR 80 | Temp 98.0°F | Resp 16 | Wt 87.7 lb

## 2023-08-06 DIAGNOSIS — Z5112 Encounter for antineoplastic immunotherapy: Secondary | ICD-10-CM | POA: Diagnosis not present

## 2023-08-06 DIAGNOSIS — C50412 Malignant neoplasm of upper-outer quadrant of left female breast: Secondary | ICD-10-CM | POA: Insufficient documentation

## 2023-08-06 DIAGNOSIS — Z170 Estrogen receptor positive status [ER+]: Secondary | ICD-10-CM

## 2023-08-06 LAB — CMP (CANCER CENTER ONLY)
ALT: 26 U/L (ref 0–44)
AST: 25 U/L (ref 15–41)
Albumin: 4.1 g/dL (ref 3.5–5.0)
Alkaline Phosphatase: 49 U/L (ref 38–126)
Anion gap: 7 (ref 5–15)
BUN: 18 mg/dL (ref 6–20)
CO2: 26 mmol/L (ref 22–32)
Calcium: 9.1 mg/dL (ref 8.9–10.3)
Chloride: 102 mmol/L (ref 98–111)
Creatinine: 0.7 mg/dL (ref 0.44–1.00)
GFR, Estimated: >60 mL/min (ref >60)
Glucose, Bld: 99 mg/dL (ref 70–99)
Potassium: 3.8 mmol/L (ref 3.5–5.1)
Sodium: 135 mmol/L (ref 135–145)
Total Bilirubin: 0.6 mg/dL (ref 0.0–1.2)
Total Protein: 6.5 g/dL (ref 6.5–8.1)

## 2023-08-06 LAB — CBC WITH DIFFERENTIAL/PLATELET
Abs Immature Granulocytes: 0.01 K/uL (ref 0.00–0.07)
Basophils Absolute: 0 K/uL (ref 0.0–0.1)
Basophils Relative: 0 %
Eosinophils Absolute: 0 K/uL (ref 0.0–0.5)
Eosinophils Relative: 0 %
HCT: 36.8 % (ref 36.0–46.0)
Hemoglobin: 12.2 g/dL (ref 12.0–15.0)
Immature Granulocytes: 0 %
Lymphocytes Relative: 14 %
Lymphs Abs: 0.7 K/uL (ref 0.7–4.0)
MCH: 29.3 pg (ref 26.0–34.0)
MCHC: 33.2 g/dL (ref 30.0–36.0)
MCV: 88.5 fL (ref 80.0–100.0)
Monocytes Absolute: 0.4 K/uL (ref 0.1–1.0)
Monocytes Relative: 7 %
Neutro Abs: 4 K/uL (ref 1.7–7.7)
Neutrophils Relative %: 79 %
Platelets: 139 K/uL — ABNORMAL LOW (ref 150–400)
RBC: 4.16 MIL/uL (ref 3.87–5.11)
RDW: 13.8 % (ref 11.5–15.5)
WBC: 5.1 K/uL (ref 4.0–10.5)
nRBC: 0 % (ref 0.0–0.2)

## 2023-08-06 LAB — VITAMIN D 25 HYDROXY (VIT D DEFICIENCY, FRACTURES): Vit D, 25-Hydroxy: 48.71 ng/mL (ref 30–100)

## 2023-08-06 MED ORDER — ACETAMINOPHEN 325 MG PO TABS
650.0000 mg | ORAL_TABLET | Freq: Once | ORAL | Status: AC
  Administered 2023-08-06: 650 mg via ORAL
  Filled 2023-08-06: qty 2

## 2023-08-06 MED ORDER — METHYLPREDNISOLONE SODIUM SUCC 125 MG IJ SOLR
125.0000 mg | Freq: Once | INTRAMUSCULAR | Status: AC
  Administered 2023-08-06: 125 mg via INTRAVENOUS
  Filled 2023-08-06: qty 2

## 2023-08-06 MED ORDER — SODIUM CHLORIDE 0.9% FLUSH
10.0000 mL | INTRAVENOUS | Status: DC | PRN
  Filled 2023-08-06: qty 10

## 2023-08-06 MED ORDER — SODIUM CHLORIDE 0.9 % IV SOLN
INTRAVENOUS | Status: DC
Start: 2023-08-06 — End: 2023-08-06
  Filled 2023-08-06: qty 250

## 2023-08-06 MED ORDER — FAMOTIDINE IN NACL 20-0.9 MG/50ML-% IV SOLN
20.0000 mg | Freq: Once | INTRAVENOUS | Status: AC
  Administered 2023-08-06: 20 mg via INTRAVENOUS

## 2023-08-06 MED ORDER — HEPARIN SOD (PORK) LOCK FLUSH 100 UNIT/ML IV SOLN
500.0000 [IU] | Freq: Once | INTRAVENOUS | Status: DC | PRN
Start: 2023-08-06 — End: 2023-08-06
  Filled 2023-08-06: qty 5

## 2023-08-06 MED ORDER — CETIRIZINE HCL 10 MG/ML IV SOLN
10.0000 mg | Freq: Once | INTRAVENOUS | Status: AC
  Administered 2023-08-06: 10 mg via INTRAVENOUS
  Filled 2023-08-06: qty 1

## 2023-08-06 MED ORDER — TRASTUZUMAB-ANNS CHEMO 150 MG IV SOLR
6.0000 mg/kg | Freq: Once | INTRAVENOUS | Status: AC
  Administered 2023-08-06: 252 mg via INTRAVENOUS
  Filled 2023-08-06: qty 12

## 2023-08-06 NOTE — Progress Notes (Signed)
 Radiation Oncology Follow up Note  Name: Melissa Rodriguez   Date:   08/06/2023 MRN:  [id number] DOB: 01-Jan-1966    This 58 y.o. female presents to the clinic today for 1 month follow-up status post radiation therapy to her left breast for recurrent stage I triple positive invasive mammary carcinoma.  REFERRING PROVIDER: Alphonsa Glendia LABOR, MD  HPI: Patient is a 58 year old female now at 1 month having completed salvage radiation therapy to her left chest wall status post mastectomy for triple positive invasive mammary carcinoma and patient treated back in 2008 for stage I ER/PR positive left breast cancer.  There were 2 separate masses in the breast largest measuring 2.4 x 2.3 x 1.6 cm.  There were 2 actual separate masses the smaller one had a positive margin.  There is also DCIS present at the the margin at 0.1 mm.  Patient received TCHP chemotherapy..  She is now 1 month out from chest wall and peripheral lymphatic radiation she is doing well.  She specifically denies any chest wall pain cough or bone pain.  She is currently on cycle 12 of adjuvant Herceptin   COMPLICATIONS OF TREATMENT: none  FOLLOW UP COMPLIANCE: keeps appointments   PHYSICAL EXAM:  BP 128/76   Pulse 80   Temp 98 F (36.7 C)   Resp 16   Wt 87 lb 11.2 oz (39.8 kg)   LMP 07/05/2012   BMI 15.54 kg/m  Patient is status post bilateral mastectomies.  No evidence of chest wall mass or nodularity is noted.  Skin is well-healed.  No axillary or supraclavicular adenopathy is identified.  No evidence of lymphedema in the left upper extremity is noted.  Cardiac examination is essentially remarkable.  RADIOLOGY RESULTS: No current films for review  PLAN: Present time she continues under treatment with medical oncology.  I will see her 1 more time in 6 months for follow-up.  Patient knows to call with any concerns at any time.  She is being started on endocrine therapy by medical oncology.  Patient knows to call with any concerns.  I  would like to take this opportunity to thank you for allowing me to participate in the care of your patient.SABRA Marcey Penton, MD

## 2023-08-06 NOTE — Progress Notes (Signed)
 Nutrition Follow-up:  Patient with left breast cancer, triple positive.  Completed TCHP chemotherapy. Receiving herceptin  only at this time.  Started tamoxifen  but noted effecting stomach   Met with patient briefly while in infusion.  Eating sandwich for lunch during visit.  Reports that she is doing ok.  Continues to try and find a balance between eating and stomach issues.    Medications: reviewed  Labs: reviewed  Anthropometrics:   Weight 87 lb today  89 lb on 6/11 91 lb on 5/21 90 lb on 4/30 92 lb on 3/11 92 lb on 3/5 86 lb on 02/03/23   NUTRITION DIAGNOSIS: Inadequate oral intake stable    INTERVENTION:  Continue to increase calories and protein to promote weight gain    MONITORING, EVALUATION, GOAL: weight trends, intake   NEXT VISIT: as needed   Lakie Mclouth B. Dasie SOLON, CSO, LDN Registered Dietitian [phone]

## 2023-08-07 ENCOUNTER — Inpatient Hospital Stay

## 2023-08-07 ENCOUNTER — Other Ambulatory Visit: Payer: Self-pay

## 2023-08-07 DIAGNOSIS — Z5112 Encounter for antineoplastic immunotherapy: Secondary | ICD-10-CM | POA: Diagnosis not present

## 2023-08-07 DIAGNOSIS — N390 Urinary tract infection, site not specified: Secondary | ICD-10-CM

## 2023-08-07 LAB — URINALYSIS, COMPLETE (UACMP) WITH MICROSCOPIC
Bacteria, UA: NONE SEEN
Bilirubin Urine: NEGATIVE
Glucose, UA: NEGATIVE mg/dL
Hgb urine dipstick: NEGATIVE
Ketones, ur: NEGATIVE mg/dL
Nitrite: NEGATIVE
Protein, ur: NEGATIVE mg/dL
Specific Gravity, Urine: 1.012 (ref 1.005–1.030)
pH: 6 (ref 5.0–8.0)

## 2023-08-07 NOTE — Telephone Encounter (Signed)
 Outgoing call spoke to patient who will come to the center today around noon to submit UA and culture.  Patient did mention that the itchiness from last Friday has improved.  Patient has no further questions / concerns at this time. Secure chat sent to [facility] to create LOV today at noon.

## 2023-08-08 LAB — URINE CULTURE: Culture: <10000 — AB

## 2023-08-11 ENCOUNTER — Encounter: Payer: Self-pay | Admitting: Oncology

## 2023-08-11 NOTE — Telephone Encounter (Signed)
 Per Lauren No evidence of infection. If ongoing symptoms she can be seen in [facility]. Thanks.  Outbound call to patient; informed of above.  Patient has no further questions / concerns at this time.

## 2023-08-27 ENCOUNTER — Encounter: Payer: Self-pay | Admitting: Oncology

## 2023-08-27 ENCOUNTER — Inpatient Hospital Stay

## 2023-08-27 ENCOUNTER — Inpatient Hospital Stay: Attending: Oncology | Admitting: Oncology

## 2023-08-27 VITALS — BP 121/52 | HR 92 | Temp 97.7°F | Resp 18 | Ht 63.0 in | Wt 89.3 lb

## 2023-08-27 DIAGNOSIS — Z170 Estrogen receptor positive status [ER+]: Secondary | ICD-10-CM | POA: Insufficient documentation

## 2023-08-27 DIAGNOSIS — Z1721 Progesterone receptor positive status: Secondary | ICD-10-CM | POA: Insufficient documentation

## 2023-08-27 DIAGNOSIS — Z90722 Acquired absence of ovaries, bilateral: Secondary | ICD-10-CM | POA: Diagnosis not present

## 2023-08-27 DIAGNOSIS — Z5112 Encounter for antineoplastic immunotherapy: Secondary | ICD-10-CM | POA: Diagnosis present

## 2023-08-27 DIAGNOSIS — Z1501 Genetic susceptibility to malignant neoplasm of breast: Secondary | ICD-10-CM | POA: Diagnosis not present

## 2023-08-27 DIAGNOSIS — M810 Age-related osteoporosis without current pathological fracture: Secondary | ICD-10-CM | POA: Diagnosis not present

## 2023-08-27 DIAGNOSIS — C50412 Malignant neoplasm of upper-outer quadrant of left female breast: Secondary | ICD-10-CM | POA: Diagnosis present

## 2023-08-27 DIAGNOSIS — Z1509 Genetic susceptibility to other malignant neoplasm: Secondary | ICD-10-CM | POA: Insufficient documentation

## 2023-08-27 DIAGNOSIS — Z803 Family history of malignant neoplasm of breast: Secondary | ICD-10-CM | POA: Insufficient documentation

## 2023-08-27 DIAGNOSIS — Z90710 Acquired absence of both cervix and uterus: Secondary | ICD-10-CM | POA: Diagnosis not present

## 2023-08-27 DIAGNOSIS — Z923 Personal history of irradiation: Secondary | ICD-10-CM | POA: Insufficient documentation

## 2023-08-27 DIAGNOSIS — Z1731 Human epidermal growth factor receptor 2 positive status: Secondary | ICD-10-CM | POA: Diagnosis not present

## 2023-08-27 DIAGNOSIS — Z809 Family history of malignant neoplasm, unspecified: Secondary | ICD-10-CM | POA: Insufficient documentation

## 2023-08-27 DIAGNOSIS — Z9013 Acquired absence of bilateral breasts and nipples: Secondary | ICD-10-CM | POA: Diagnosis not present

## 2023-08-27 DIAGNOSIS — D3912 Neoplasm of uncertain behavior of left ovary: Secondary | ICD-10-CM | POA: Insufficient documentation

## 2023-08-27 MED ORDER — FAMOTIDINE IN NACL 20-0.9 MG/50ML-% IV SOLN
20.0000 mg | Freq: Once | INTRAVENOUS | Status: AC
  Administered 2023-08-27 (×2): 20 mg via INTRAVENOUS
  Filled 2023-08-27: qty 50

## 2023-08-27 MED ORDER — ACETAMINOPHEN 325 MG PO TABS
650.0000 mg | ORAL_TABLET | Freq: Once | ORAL | Status: AC
  Administered 2023-08-27 (×2): 650 mg via ORAL
  Filled 2023-08-27: qty 2

## 2023-08-27 MED ORDER — TRASTUZUMAB-ANNS CHEMO 150 MG IV SOLR
6.0000 mg/kg | Freq: Once | INTRAVENOUS | Status: AC
  Administered 2023-08-27 (×2): 252 mg via INTRAVENOUS
  Filled 2023-08-27: qty 12

## 2023-08-27 MED ORDER — CETIRIZINE HCL 10 MG/ML IV SOLN
10.0000 mg | Freq: Once | INTRAVENOUS | Status: AC
  Administered 2023-08-27 (×2): 10 mg via INTRAVENOUS
  Filled 2023-08-27: qty 1

## 2023-08-27 MED ORDER — METHYLPREDNISOLONE SODIUM SUCC 125 MG IJ SOLR
125.0000 mg | Freq: Once | INTRAMUSCULAR | Status: AC
  Administered 2023-08-27 (×2): 125 mg via INTRAVENOUS
  Filled 2023-08-27: qty 2

## 2023-08-27 MED ORDER — SODIUM CHLORIDE 0.9 % IV SOLN
INTRAVENOUS | Status: DC
  Filled 2023-08-27: qty 250

## 2023-08-27 NOTE — Progress Notes (Signed)
 Hematology/Oncology Consult note [facility]  Telephone:([phone] [phone]  Patient Care Team: Alphonsa Glendia LABOR, MD as PCP - General (Family Medicine) Celestia Joesph SQUIBB, RN as Oncology Nurse Navigator (Medical Oncology) Melanee Annah BROCKS, MD as Consulting Physician (Oncology) Maurie Rayfield BIRCH, RN as Oncology Nurse Navigator Lenn Aran, MD as Consulting Physician (Radiation Oncology)   Name of the patient: Melissa Rodriguez  [id number]  11/17/1965   Date of visit: 08/27/23  Diagnosis-  pathologic prognostic stage Ib invasive mammary carcinoma of the left breast T2 N1 M0 ER/PR positive HER2 positive     Chief complaint/ Reason for visit-on treatment assessment prior to cycle 13 of adjuvant Herceptin   Heme/Onc history: Patient is a 58 year old female with a prior history of lumpectomy in 2008 which showed a 7 mm stage I ER/PR positive HER2 negative left breast cancer.  She underwent lumpectomy and adjuvant radiation therapy but did not go through endocrine therapy.   She self palpated a left breast lump led to a diagnostic mammogram in July 2024.  Diagnostic mammogram showed 1.9 cm irregular hypoechoic mass 1 o'clock position 7 cm from the breast grade 3 ER 99% positive, PR 85% positive and HER2 equivocal by IHC and FISH positive.  Ki-67 35% there were calcifications noted in the right breast as well which were biopsy-proven benign.     She underwent genetic testing and was found to have BRCA2 mutation   Patient was seen by Dr. Rogers and he recommended upfront surgery and consideration for adjuvant chemotherapy.  Patient underwent bilateral mastectomy on 08/15/2022.  No evidence of malignancy in the right breast.  There were 2 distinct masses noted in the left breast with dominant mass measured 2.4 x 2.3 x 1.6 cm grade 3 with negative margins.  There was another 1 cm mass where there was uptake of radiotracer dye with positive posterior lateral anterior  margin.  This was believed to be the sentinel lymph node although on pathology it states that there were no lymph nodes identified likely due to prior lumpectomy. mpT2.  She was staged as T2 N1 triple positive disease.  Patient completed 6 cycles of adjuvant TCHP chemotherapy and is currently on maintenance Herceptin  only.  She could not tolerate dual Herceptin  and Perjeta .  She completed adjuvant radiation   Patient completed 6 cycles of adjuvant TCHP chemotherapy and is presently on maintenance Herceptin  and Perjeta .  Patient found to have 7.9 cm lobulated left ovarian mass which was resected at [facility] in March 2025.  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  No pathologic diagnosis was noted in the uterus contralateral ovary and fallopian tube.    Interval history-patient started taking tamoxifen  20 mg daily upon completion of radiation therapy and could not tolerated after 1 week due to significant GI side effects and diarrhea.  ECOG PS- 1 Pain scale- 0   Review of systems- Review of Systems  Constitutional:  Positive for malaise/fatigue. Negative for chills, fever and weight loss.  HENT:  Negative for congestion, ear discharge and nosebleeds.   Eyes:  Negative for blurred vision.  Respiratory:  Negative for cough, hemoptysis, sputum production, shortness of breath and wheezing.   Cardiovascular:  Negative for chest pain, palpitations, orthopnea and claudication.  Gastrointestinal:  Positive for diarrhea. Negative for abdominal pain, blood in stool, constipation, heartburn, melena, nausea and vomiting.  Genitourinary:  Negative for dysuria, flank pain, frequency, hematuria and urgency.  Musculoskeletal:  Negative for back pain, joint pain and myalgias.  Skin:  Negative for rash.  Neurological:  Negative for dizziness, tingling, focal weakness, seizures, weakness and headaches.  Endo/Heme/Allergies:  Does not bruise/bleed easily.   Psychiatric/Behavioral:  Negative for depression and suicidal ideas. The patient does not have insomnia.       Allergies  Allergen Reactions   Benadryl  [Diphenhydramine  Hcl]     Jittery and Hallucinations   Diphenhydramine  Hcl Anxiety    Other Reaction(s): Hallucination  Jittery and Hallucinations    Made her feel like she was crawling the walls.   Hydrocodone  Shortness Of Breath    Felt like she could not breathe-February 2020   Penicillins Hives and Rash    Did it involve swelling of the face/tongue/throat, SOB, or low BP? No  Did it involve sudden or severe rash/hives, skin peeling, or any reaction on the inside of your mouth or nose? No  Did you need to seek medical attention at a hospital or doctor's office? No  When did it last happen?        If all above answers are "NO", may proceed with cephalosporin use.  Any medicine related to PCN    Did it involve swelling of the face/tongue/throat, SOB, or low BP? No Did it involve sudden or severe rash/hives, skin peeling, or any reaction on the inside of your mouth or nose? No Did you need to seek medical attention at a hospital or doctor's office? No When did it last happen?       If all above answers are "NO", may proceed with cephalosporin use.   Ciprofloxacin Hives   Docetaxel  Other (See Comments)    Tingling of lips, teeth hurting, hot tongue   Doxycycline  Other (See Comments)    Mouth pain    Trastuzumab -Anns [Trastuzumab -Pkrb] Itching    Tingling and hot tongue and lips     Past Medical History:  Diagnosis Date   Adenocarcinoma of breast (HCC) 05/20/2011   Stage I (T1b N0), grade 1, well-differentiated adenocarcinoma of the medial lower quadrant of the left breast. It was a tubular carcinoma, status post lumpectomy on 03/13/2006, ER positive 79%, PR positive 92%, HER2/neu negative, Ki-67 marker low at 18%. Her cancer was 7 mm in size, and she had 4 sentinel nodes all negative along with her lumpectomy. She was  treated with radiation therapy postoper   Complication of anesthesia    pt states her bladder did not wake up after one surgery. Went to ER, had a urinary catheter for 1 week   History of kidney stones    Iron deficiency    Iron deficiency anemia 05/20/2011   Secondary to GU bleeding.  Good absorber of PO iron   Personal history of radiation therapy      Past Surgical History:  Procedure Laterality Date   BREAST BIOPSY Left 07/16/2022   US  LT BREAST BX W LOC DEV 1ST LESION IMG BX SPEC US  GUIDE 07/16/2022 Lennon Nest, MD [facility]   BREAST BIOPSY Right 07/26/2022   MM RT BREAST BX W LOC DEV 1ST LESION IMAGE BX SPEC STEREO GUIDE 07/26/2022 [facility]   BREAST LUMPECTOMY Left    EXTRACORPOREAL SHOCK WAVE LITHOTRIPSY Left 02/26/2018   Procedure: EXTRACORPOREAL SHOCK WAVE LITHOTRIPSY (ESWL);  Surgeon: Sherrilee Belvie CROME, MD;  Location: [facility];  Service: Urology;  Laterality: Left;   LUMPECTOM LEFT BREAST  02/2006   LYMPH NODE BIOPSY LEFT  04/2006   MASTECTOMY W/ SENTINEL NODE BIOPSY Left 08/15/2022  Procedure: LEFT SIMPLE MASTECTOMY, LEFT SENTINEL LYMPH NODE MAPPING;  Surgeon: Vanderbilt Ned, MD;  Location: [facility];  Service: General;  Laterality: Left;   PORTACATH PLACEMENT N/A 10/09/2022   Procedure: INSERTION PORT-A-CATH;  Surgeon: Vanderbilt Ned, MD;  Location: [facility];  Service: General;  Laterality: N/A;   SIMPLE MASTECTOMY WITH AXILLARY SENTINEL NODE BIOPSY Right 08/15/2022   Procedure: RIGHT RISK REDUCING MASTECTOMY;  Surgeon: Vanderbilt Ned, MD;  Location:  SURGERY CENTER;  Service: General;  Laterality: Right;    Social History   Socioeconomic History   Marital status: Married    Spouse name: Not on file   Number of children: Not on file   Years of education: Not on file   Highest education level: Some college, no degree  Occupational History   Not on file  Tobacco Use   Smoking status: Never   Smokeless tobacco:  Never  Vaping Use   Vaping status: Never Used  Substance and Sexual Activity   Alcohol use: No   Drug use: No   Sexual activity: Yes    Birth control/protection: Post-menopausal  Other Topics Concern   Not on file  Social History Narrative   Not on file   Social Drivers of Health   Financial Resource Strain: Patient Declined (10/16/2022)   Overall Financial Resource Strain (CARDIA)    Difficulty of Paying Living Expenses: Patient declined  Recent Concern: Physicist, medical Strain - High Risk (10/03/2022)   Overall Financial Resource Strain (CARDIA)    Difficulty of Paying Living Expenses: Hard  Food Insecurity: No Food Insecurity (11/27/2022)   Hunger Vital Sign    Worried About Running Out of Food in the Last Year: Never true    Ran Out of Food in the Last Year: Never true  Transportation Needs: No Transportation Needs (11/27/2022)   PRAPARE - Administrator, Civil Service (Medical): No    Lack of Transportation (Non-Medical): No  Physical Activity: Insufficiently Active (10/16/2022)   Exercise Vital Sign    Days of Exercise per Week: 1 day    Minutes of Exercise per Session: 20 min  Stress: No Stress Concern Present (10/16/2022)   [employer] of Occupational Health - Occupational Stress Questionnaire    Feeling of Stress : Not at all  Social Connections: Unknown (10/16/2022)   Social Connection and Isolation Panel    Frequency of Communication with Friends and Family: Patient declined    Frequency of Social Gatherings with Friends and Family: Patient declined    Attends Religious Services: More than 4 times per year    Active Member of [employer] or Organizations: Yes    Attends Engineer, structural: More than 4 times per year    Marital Status: Married  Catering manager Violence: Not At Risk (11/27/2022)   Humiliation, Afraid, Rape, and Kick questionnaire    Fear of Current or Ex-Partner: No    Emotionally Abused: No    Physically Abused: No     Sexually Abused: No    Family History  Problem Relation Age of Onset   Hypertension Mother    Hypertension Father    Heart disease Father 68       MI in his 37s   Cancer Maternal Aunt        unk type, possibly hip. dx >50   Breast cancer Paternal Aunt        dx 45s   Cancer Paternal Aunt  unknown, d. 59s   Prostate cancer Cousin        dx 40s-50s   Stomach cancer Cousin        dx 40s-50s     Current Outpatient Medications:    aluminum -magnesium  hydroxide 200-200 MG/5ML suspension, Take 15 mLs by mouth every 6 (six) hours as needed for indigestion. Mix 1:1 with Lidocaine  and swish and spit 4 times a day, Disp: 355 mL, Rfl: 2   Bioflavonoid Products (GRAPE SEED PO), Take 1 capsule by mouth daily., Disp: , Rfl:    Calcium Carbonate Antacid (TUMS PO), Take 1 tablet by mouth as needed. Reported on 05/22/2015, Disp: , Rfl:    CARBOPLATIN  IV, Inject into the vein every 21 ( twenty-one) days., Disp: , Rfl:    Cholecalciferol (VITAMIN D  PO), Take 1,000 Units by mouth daily., Disp: , Rfl:    diphenoxylate -atropine  (LOMOTIL ) 2.5-0.025 MG tablet, Take 1 tablet by mouth 4 (four) times daily as needed for diarrhea or loose stools., Disp: 60 tablet, Rfl: 0   DOCETAXEL  IV, Inject into the vein every 21 ( twenty-one) days., Disp: , Rfl:    lidocaine  (XYLOCAINE ) 2 % solution, Use as directed 15 mLs in the mouth or throat 4 (four) times daily. Mix 1:1 with maalox and swish and spit 4 times a day, Disp: 100 mL, Rfl: 2   lidocaine -prilocaine  (EMLA ) cream, Apply a quarter-sized amount to port a cath site and cover with plastic wrap 1 hour prior to infusion appointments, Disp: 30 g, Rfl: 3   loratadine  (CLARITIN ) 5 MG chewable tablet, Chew 5 mg by mouth daily., Disp: , Rfl:    montelukast  (SINGULAIR ) 10 MG tablet, Take one 10mg  tab for 2 days before chemo (day 1 and day 2), Disp: 20 tablet, Rfl: 0   Multiple Vitamin (MULTI-VITAMIN) tablet, Take 1 tablet by mouth daily., Disp: , Rfl:    ondansetron   (ZOFRAN ) 8 MG tablet, Take 1 tablet (8 mg total) by mouth every 8 (eight) hours as needed for nausea or vomiting., Disp: 20 tablet, Rfl: 0   PERTUZUMAB  IV, Inject into the vein every 21 ( twenty-one) days., Disp: , Rfl:    predniSONE  (DELTASONE ) 20 MG tablet, Take one 20mg  tab for 2 days before chemo (day 1 and day 2), Disp: 20 tablet, Rfl: 0   tamoxifen  (NOLVADEX ) 20 MG tablet, Take 1 tablet (20 mg total) by mouth daily. (Patient not taking: Reported on 08/27/2023), Disp: 30 tablet, Rfl: 3   TRASTUZUMAB  IV, Inject into the vein every 21 ( twenty-one) days., Disp: , Rfl:  No current facility-administered medications for this visit.  Facility-Administered Medications Ordered in Other Visits:    0.9 %  sodium chloride  infusion, , Intravenous, Continuous, Melanee Annah BROCKS, MD, Stopped at 06/25/23 1300   0.9 %  sodium chloride  infusion, , Intravenous, Continuous, Melanee Annah BROCKS, MD, Stopped at 08/27/23 1140   sodium chloride  flush (NS) 0.9 % injection 10 mL, 10 mL, Intracatheter, PRN, Melanee Annah BROCKS, MD, 10 mL at 06/25/23 1101  Physical exam:  Vitals:   08/27/23 0931  BP: (!) 121/52  Pulse: 92  Resp: 18  Temp: 97.7 F (36.5 C)  TempSrc: Tympanic  SpO2: 100%  Weight: 89 lb 4.8 oz (40.5 kg)  Height: 5' 3 (1.6 m)   Physical Exam Cardiovascular:     Rate and Rhythm: Normal rate and regular rhythm.     Heart sounds: Normal heart sounds.  Pulmonary:     Effort: Pulmonary effort is normal.  Breath sounds: Normal breath sounds.  Skin:    General: Skin is warm and dry.  Neurological:     Mental Status: She is alert and oriented to person, place, and time.      I have personally reviewed labs listed below:    Latest Ref Rng & Units 08/06/2023   10:35 AM  CMP  Glucose 70 - 99 mg/dL 99   BUN 6 - 20 mg/dL 18   Creatinine 9.55 - 1.00 mg/dL 9.29   Sodium 864 - 854 mmol/L 135   Potassium 3.5 - 5.1 mmol/L 3.8   Chloride 98 - 111 mmol/L 102   CO2 22 - 32 mmol/L 26   Calcium 8.9 - 10.3  mg/dL 9.1   Total Protein 6.5 - 8.1 g/dL 6.5   Total Bilirubin 0.0 - 1.2 mg/dL 0.6   Alkaline Phos 38 - 126 U/L 49   AST 15 - 41 U/L 25   ALT 0 - 44 U/L 26       Latest Ref Rng & Units 08/06/2023   10:35 AM  CBC  WBC 4.0 - 10.5 K/uL 5.1   Hemoglobin 12.0 - 15.0 g/dL 87.7   Hematocrit 63.9 - 46.0 % 36.8   Platelets 150 - 400 K/uL 139    I h   Assessment and plan- Patient is a 58 y.o. female  with history of BRCA2 mutation and second left breast cancer stage Ib MP T2 N0 versus pT1 cN1 M0 ER/PR positive HER2 positive.  She is here for on treatment assessment prior to cycle 14 of adjuvant Herceptin   Counts reviewed proceed with cycle 14 of Herceptin  today.  Perjeta  was dropped because of diarrhea.  She will be completing 1 year of treatment end of next month.  I have also discussed the results of echocardiogram which were done in [place] in July 2025 which shows normal EF and no regional wall motion abnormality.  Patient does have an ER positive disease and would benefit from endocrine therapy.  She could not tolerate tamoxifen .  She has baseline osteoporosis.  We discussed trial of aromatase inhibitors.  Patient would like to complete adjuvant Herceptin  and then think about what she wants to do before trying any medication.  History of osteoporosis: She is presently on calcium and vitamin D  and would like to think about parenteral bisphosphonate options such as Prolia or Zometa down the line  I will see her back in 3 weeks for cycle 15 of Herceptin    Visit Diagnosis 1. Malignant neoplasm of upper-outer quadrant of left breast in female, estrogen receptor positive (HCC)   2. Encounter for monoclonal antibody treatment for malignancy      Dr. Annah Skene, MD, MPH [facility] at [facility] [phone] 08/27/2023 2:40 PM

## 2023-08-27 NOTE — Patient Instructions (Signed)
 [facility] - A DEPT OF [facility]  Discharge Instructions: Thank you for choosing [facility] to provide your oncology and hematology care.  If you have a lab appointment with the Cancer Center, please go directly to the Cancer Center and check in at the registration area.  Wear comfortable clothing and clothing appropriate for easy access to any Portacath or PICC line.   We strive to give you quality time with your provider. You may need to reschedule your appointment if you arrive late (15 or more minutes).  Arriving late affects you and other patients whose appointments are after yours.  Also, if you miss three or more appointments without notifying the office, you may be dismissed from the clinic at the provider's discretion.      For prescription refill requests, have your pharmacy contact our office and allow 72 hours for refills to be completed.    Today you received the following chemotherapy and/or immunotherapy agents Kanjinti       To help prevent nausea and vomiting after your treatment, we encourage you to take your nausea medication as directed.  BELOW ARE SYMPTOMS THAT SHOULD BE REPORTED IMMEDIATELY: *FEVER GREATER THAN 100.4 F (38 C) OR HIGHER *CHILLS OR SWEATING *NAUSEA AND VOMITING THAT IS NOT CONTROLLED WITH YOUR NAUSEA MEDICATION *UNUSUAL SHORTNESS OF BREATH *UNUSUAL BRUISING OR BLEEDING *URINARY PROBLEMS (pain or burning when urinating, or frequent urination) *BOWEL PROBLEMS (unusual diarrhea, constipation, pain near the anus) TENDERNESS IN MOUTH AND THROAT WITH OR WITHOUT PRESENCE OF ULCERS (sore throat, sores in mouth, or a toothache) UNUSUAL RASH, SWELLING OR PAIN  UNUSUAL VAGINAL DISCHARGE OR ITCHING   Items with * indicate a potential emergency and should be followed up as soon as possible or go to the Emergency Department if any problems should occur.  Please show the CHEMOTHERAPY ALERT CARD or IMMUNOTHERAPY  ALERT CARD at check-in to the Emergency Department and triage nurse.  Should you have questions after your visit or need to cancel or reschedule your appointment, please contact [facility] - A DEPT OF [facility]  [phone] and follow the prompts.  Office hours are 8:00 a.m. to 4:30 p.m. Monday - Friday. Please note that voicemails left after 4:00 p.m. may not be returned until the following business day.  We are closed weekends and major holidays. You have access to a nurse at all times for urgent questions. Please call the main number to the clinic [phone] and follow the prompts.  For any non-urgent questions, you may also contact your provider using MyChart. We now offer e-Visits for anyone 58 and older to request care online for non-urgent symptoms. For details visit [email or web address].   Also download the MyChart app! Go to the app store, search "MyChart", open the app, select [place], and log in with your MyChart username and password.

## 2023-08-27 NOTE — Progress Notes (Signed)
 Patient said she's doing well. She would like to speak with Dr. Melanee about starting a multivitamin. She also stated that she's had to come stop the nolvadex  medication.

## 2023-09-17 ENCOUNTER — Inpatient Hospital Stay: Admitting: Oncology

## 2023-09-17 ENCOUNTER — Inpatient Hospital Stay

## 2023-09-17 ENCOUNTER — Inpatient Hospital Stay: Attending: Oncology

## 2023-09-17 ENCOUNTER — Encounter: Payer: Self-pay | Admitting: Oncology

## 2023-09-17 VITALS — BP 120/58 | HR 72 | Resp 18

## 2023-09-17 VITALS — BP 135/84 | HR 88 | Temp 98.6°F | Resp 16 | Wt 90.9 lb

## 2023-09-17 DIAGNOSIS — Z1721 Progesterone receptor positive status: Secondary | ICD-10-CM | POA: Diagnosis not present

## 2023-09-17 DIAGNOSIS — Z809 Family history of malignant neoplasm, unspecified: Secondary | ICD-10-CM | POA: Diagnosis not present

## 2023-09-17 DIAGNOSIS — Z170 Estrogen receptor positive status [ER+]: Secondary | ICD-10-CM | POA: Diagnosis not present

## 2023-09-17 DIAGNOSIS — D3912 Neoplasm of uncertain behavior of left ovary: Secondary | ICD-10-CM | POA: Diagnosis not present

## 2023-09-17 DIAGNOSIS — Z90722 Acquired absence of ovaries, bilateral: Secondary | ICD-10-CM | POA: Diagnosis not present

## 2023-09-17 DIAGNOSIS — Z90710 Acquired absence of both cervix and uterus: Secondary | ICD-10-CM | POA: Diagnosis not present

## 2023-09-17 DIAGNOSIS — Z1501 Genetic susceptibility to malignant neoplasm of breast: Secondary | ICD-10-CM | POA: Diagnosis not present

## 2023-09-17 DIAGNOSIS — Z923 Personal history of irradiation: Secondary | ICD-10-CM | POA: Insufficient documentation

## 2023-09-17 DIAGNOSIS — Z1731 Human epidermal growth factor receptor 2 positive status: Secondary | ICD-10-CM | POA: Insufficient documentation

## 2023-09-17 DIAGNOSIS — C50412 Malignant neoplasm of upper-outer quadrant of left female breast: Secondary | ICD-10-CM

## 2023-09-17 DIAGNOSIS — Z1509 Genetic susceptibility to other malignant neoplasm: Secondary | ICD-10-CM | POA: Insufficient documentation

## 2023-09-17 DIAGNOSIS — Z9013 Acquired absence of bilateral breasts and nipples: Secondary | ICD-10-CM | POA: Insufficient documentation

## 2023-09-17 DIAGNOSIS — Z5112 Encounter for antineoplastic immunotherapy: Secondary | ICD-10-CM

## 2023-09-17 DIAGNOSIS — M810 Age-related osteoporosis without current pathological fracture: Secondary | ICD-10-CM | POA: Diagnosis not present

## 2023-09-17 DIAGNOSIS — Z803 Family history of malignant neoplasm of breast: Secondary | ICD-10-CM | POA: Diagnosis not present

## 2023-09-17 LAB — CMP (CANCER CENTER ONLY)
ALT: 18 U/L (ref 0–44)
AST: 19 U/L (ref 15–41)
Albumin: 3.8 g/dL (ref 3.5–5.0)
Alkaline Phosphatase: 50 U/L (ref 38–126)
Anion gap: 8 (ref 5–15)
BUN: 18 mg/dL (ref 6–20)
CO2: 25 mmol/L (ref 22–32)
Calcium: 8.7 mg/dL — ABNORMAL LOW (ref 8.9–10.3)
Chloride: 101 mmol/L (ref 98–111)
Creatinine: 0.6 mg/dL (ref 0.44–1.00)
GFR, Estimated: >60 mL/min (ref >60)
Glucose, Bld: 94 mg/dL (ref 70–99)
Potassium: 3.9 mmol/L (ref 3.5–5.1)
Sodium: 134 mmol/L — ABNORMAL LOW (ref 135–145)
Total Bilirubin: 0.5 mg/dL (ref 0.0–1.2)
Total Protein: 6.1 g/dL — ABNORMAL LOW (ref 6.5–8.1)

## 2023-09-17 LAB — CBC WITH DIFFERENTIAL/PLATELET
Abs Immature Granulocytes: 0.03 K/uL (ref 0.00–0.07)
Basophils Absolute: 0 K/uL (ref 0.0–0.1)
Basophils Relative: 0 %
Eosinophils Absolute: 0.1 K/uL (ref 0.0–0.5)
Eosinophils Relative: 2 %
HCT: 36.1 % (ref 36.0–46.0)
Hemoglobin: 12 g/dL (ref 12.0–15.0)
Immature Granulocytes: 1 %
Lymphocytes Relative: 19 %
Lymphs Abs: 0.9 K/uL (ref 0.7–4.0)
MCH: 29.5 pg (ref 26.0–34.0)
MCHC: 33.2 g/dL (ref 30.0–36.0)
MCV: 88.7 fL (ref 80.0–100.0)
Monocytes Absolute: 0.4 K/uL (ref 0.1–1.0)
Monocytes Relative: 9 %
Neutro Abs: 3.4 K/uL (ref 1.7–7.7)
Neutrophils Relative %: 69 %
Platelets: 163 K/uL (ref 150–400)
RBC: 4.07 MIL/uL (ref 3.87–5.11)
RDW: 13.6 % (ref 11.5–15.5)
WBC: 4.8 K/uL (ref 4.0–10.5)
nRBC: 0 % (ref 0.0–0.2)

## 2023-09-17 MED ORDER — CETIRIZINE HCL 10 MG/ML IV SOLN
10.0000 mg | Freq: Once | INTRAVENOUS | Status: AC
  Administered 2023-09-17: 10 mg via INTRAVENOUS
  Filled 2023-09-17: qty 1

## 2023-09-17 MED ORDER — TRASTUZUMAB-ANNS CHEMO 150 MG IV SOLR
6.0000 mg/kg | Freq: Once | INTRAVENOUS | Status: AC
  Administered 2023-09-17: 252 mg via INTRAVENOUS
  Filled 2023-09-17: qty 12

## 2023-09-17 MED ORDER — ACETAMINOPHEN 325 MG PO TABS
650.0000 mg | ORAL_TABLET | Freq: Once | ORAL | Status: AC
  Administered 2023-09-17: 650 mg via ORAL
  Filled 2023-09-17: qty 2

## 2023-09-17 MED ORDER — FAMOTIDINE IN NACL 20-0.9 MG/50ML-% IV SOLN
20.0000 mg | Freq: Once | INTRAVENOUS | Status: AC
  Administered 2023-09-17: 20 mg via INTRAVENOUS
  Filled 2023-09-17: qty 50

## 2023-09-17 MED ORDER — SODIUM CHLORIDE 0.9 % IV SOLN
INTRAVENOUS | Status: DC
  Filled 2023-09-17: qty 250

## 2023-09-17 MED ORDER — METHYLPREDNISOLONE SODIUM SUCC 125 MG IJ SOLR
125.0000 mg | Freq: Once | INTRAMUSCULAR | Status: AC
  Administered 2023-09-17: 125 mg via INTRAVENOUS
  Filled 2023-09-17: qty 2

## 2023-09-17 NOTE — Patient Instructions (Signed)
 [facility] - A DEPT OF [facility] [place]  Discharge Instructions: Thank you for choosing [facility] to provide your oncology and hematology care.  If you have a lab appointment with the Cancer Center, please go directly to the Cancer Center and check in at the registration area.  Wear comfortable clothing and clothing appropriate for easy access to any Portacath or PICC line.   We strive to give you quality time with your provider. You may need to reschedule your appointment if you arrive late (15 or more minutes).  Arriving late affects you and other patients whose appointments are after yours.  Also, if you miss three or more appointments without notifying the office, you may be dismissed from the clinic at the provider's discretion.      For prescription refill requests, have your pharmacy contact our office and allow 72 hours for refills to be completed.    Today you received the following chemotherapy and/or immunotherapy agents KANJINTI       To help prevent nausea and vomiting after your treatment, we encourage you to take your nausea medication as directed.  BELOW ARE SYMPTOMS THAT SHOULD BE REPORTED IMMEDIATELY: *FEVER GREATER THAN 100.4 F (38 C) OR HIGHER *CHILLS OR SWEATING *NAUSEA AND VOMITING THAT IS NOT CONTROLLED WITH YOUR NAUSEA MEDICATION *UNUSUAL SHORTNESS OF BREATH *UNUSUAL BRUISING OR BLEEDING *URINARY PROBLEMS (pain or burning when urinating, or frequent urination) *BOWEL PROBLEMS (unusual diarrhea, constipation, pain near the anus) TENDERNESS IN MOUTH AND THROAT WITH OR WITHOUT PRESENCE OF ULCERS (sore throat, sores in mouth, or a toothache) UNUSUAL RASH, SWELLING OR PAIN  UNUSUAL VAGINAL DISCHARGE OR ITCHING   Items with * indicate a potential emergency and should be followed up as soon as possible or go to the Emergency Department if any problems should occur.  Please show the CHEMOTHERAPY ALERT CARD or IMMUNOTHERAPY  ALERT CARD at check-in to the Emergency Department and triage nurse.  Should you have questions after your visit or need to cancel or reschedule your appointment, please contact [facility] - A DEPT OF [facility]  [phone] and follow the prompts.  Office hours are 8:00 a.m. to 4:30 p.m. Monday - Friday. Please note that voicemails left after 4:00 p.m. may not be returned until the following business day.  We are closed weekends and major holidays. You have access to a nurse at all times for urgent questions. Please call the main number to the clinic [phone] and follow the prompts.  For any non-urgent questions, you may also contact your provider using MyChart. We now offer e-Visits for anyone 71 and older to request care online for non-urgent symptoms. For details visit [email or web address].   Also download the MyChart app! Go to the app store, search "MyChart", open the app, select [place], and log in with your MyChart username and password.  Trastuzumab Injection What is this medication? TRASTUZUMAB (tras TOO zoo mab) treats breast cancer and stomach cancer. It works by blocking a protein that causes cancer cells to grow and multiply. This helps to slow or stop the spread of cancer cells. This medicine may be used for other purposes; ask your health care provider or pharmacist if you have questions. COMMON BRAND NAME(S): Herceptin, HERCESSI, Herzuma, KANJINTI, Ogivri, Ontruzant, Trazimera What should I tell my care team before I take this medication? They need to know if you have any of these conditions: Heart failure Lung  disease An unusual or allergic reaction to trastuzumab, other medications, foods, dyes, or preservatives Pregnant or trying to get pregnant Breast-feeding How should I use this medication? This medication is injected into a vein. It is given by your care team in a hospital or clinic setting. Talk to your care team about the  use of this medication in children. It is not approved for use in children. Overdosage: If you think you have taken too much of this medicine contact a poison control center or emergency room at once. NOTE: This medicine is only for you. Do not share this medicine with others. What if I miss a dose? Keep appointments for follow-up doses. It is important not to miss your dose. Call your care team if you are unable to keep an appointment. What may interact with this medication? Certain types of chemotherapy, such as daunorubicin, doxorubicin, epirubicin, idarubicin This list may not describe all possible interactions. Give your health care provider a list of all the medicines, herbs, non-prescription drugs, or dietary supplements you use. Also tell them if you smoke, drink alcohol, or use illegal drugs. Some items may interact with your medicine. What should I watch for while using this medication? Your condition will be monitored carefully while you are receiving this medication. This medication may make you feel generally unwell. This is not uncommon, as chemotherapy affects healthy cells as well as cancer cells. Report any side effects. Continue your course of treatment even though you feel ill unless your care team tells you to stop. This medication may increase your risk of getting an infection. Call your care team for advice if you get a fever, chills, sore throat, or other symptoms of a cold or flu. Do not treat yourself. Try to avoid being around people who are sick. Avoid taking medications that contain aspirin, acetaminophen, ibuprofen, naproxen, or ketoprofen unless instructed by your care team. These medications can hide a fever. Talk to your care team if you may be pregnant. Serious birth defects can occur if you take this medication during pregnancy and for 7 months after the last dose. You will need a negative pregnancy test before starting this medication. Contraception is recommended  while taking this medication and for 7 months after the last dose. Your care team can help you find the option that works for you. Do not breastfeed while taking this medication and for 7 months after stopping treatment. What side effects may I notice from receiving this medication? Side effects that you should report to your care team as soon as possible: Allergic reactions or angioedema--skin rash, itching or hives, swelling of the face, eyes, lips, tongue, arms, or legs, trouble swallowing or breathing Dry cough, shortness of breath or trouble breathing Heart failure--shortness of breath, swelling of the ankles, feet, or hands, sudden weight gain, unusual weakness or fatigue Infection--fever, chills, cough, or sore throat Infusion reactions--chest pain, shortness of breath or trouble breathing, feeling faint or lightheaded Side effects that usually do not require medical attention (report to your care team if they continue or are bothersome): Diarrhea Dizziness Headache Nausea Trouble sleeping Vomiting This list may not describe all possible side effects. Call your doctor for medical advice about side effects. You may report side effects to FDA at 1-800-FDA-1088. Where should I keep my medication? This medication is given in a hospital or clinic. It will not be stored at home. NOTE: This sheet is a summary. It may not cover all possible information. If you have questions about  this medicine, talk to your doctor, pharmacist, or health care provider.  2024 Elsevier/Gold Standard (2021-05-15 00:00:00)

## 2023-09-21 ENCOUNTER — Encounter: Payer: Self-pay | Admitting: Oncology

## 2023-09-21 NOTE — Progress Notes (Signed)
 Hematology/Oncology Consult note [facility]  Telephone:([phone] [phone]  Patient Care Team: Alphonsa Glendia LABOR, MD as PCP - General (Family Medicine) Celestia Joesph SQUIBB, RN as Oncology Nurse Navigator (Medical Oncology) Melanee Annah BROCKS, MD as Consulting Physician (Oncology) Maurie Rayfield BIRCH, RN as Oncology Nurse Navigator Lenn Aran, MD as Consulting Physician (Radiation Oncology)   Name of the patient: Melissa Rodriguez  [id number]  19-Oct-1965   Date of visit: 09/21/23  Diagnosis-  pathologic prognostic stage Ib invasive mammary carcinoma of the left breast T2 N1 M0 ER/PR positive HER2 positive   Chief complaint/ Reason for visit-on treatment assessment prior to cycle 14 of adjuvant Herceptin   Heme/Onc history: Patient is a 58 year old female with a prior history of lumpectomy in 2008 which showed a 7 mm stage I ER/PR positive HER2 negative left breast cancer.  She underwent lumpectomy and adjuvant radiation therapy but did not go through endocrine therapy.   She self palpated a left breast lump led to a diagnostic mammogram in July 2024.  Diagnostic mammogram showed 1.9 cm irregular hypoechoic mass 1 o'clock position 7 cm from the breast grade 3 ER 99% positive, PR 85% positive and HER2 equivocal by IHC and FISH positive.  Ki-67 35% there were calcifications noted in the right breast as well which were biopsy-proven benign.     She underwent genetic testing and was found to have BRCA2 mutation   Patient was seen by Dr. Rogers and he recommended upfront surgery and consideration for adjuvant chemotherapy.  Patient underwent bilateral mastectomy on 08/15/2022.  No evidence of malignancy in the right breast.  There were 2 distinct masses noted in the left breast with dominant mass measured 2.4 x 2.3 x 1.6 cm grade 3 with negative margins.  There was another 1 cm mass where there was uptake of radiotracer dye with positive posterior lateral anterior  margin.  This was believed to be the sentinel lymph node although on pathology it states that there were no lymph nodes identified likely due to prior lumpectomy. mpT2.  She was staged as T2 N1 triple positive disease.  Patient completed 6 cycles of adjuvant TCHP chemotherapy and is currently on maintenance Herceptin  only.  She could not tolerate dual Herceptin  and Perjeta .  She completed adjuvant radiation   Patient completed 6 cycles of adjuvant TCHP chemotherapy and is presently on maintenance Herceptin  and Perjeta .  Patient found to have 7.9 cm lobulated left ovarian mass which was resected at [facility] in March 2025.  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  No pathologic diagnosis was noted in the uterus contralateral ovary and fallopian tube.     Interval history- tolerating single agent herceptin  well.  Appetite is getting better and she is gaining weight.  Bowel movements still sometimes vary between constipation and diarrhea but overall manageable  ECOG PS- 1 Pain scale- 0   Review of systems- Review of Systems  Constitutional:  Negative for chills, fever, malaise/fatigue and weight loss.  HENT:  Negative for congestion, ear discharge and nosebleeds.   Eyes:  Negative for blurred vision.  Respiratory:  Negative for cough, hemoptysis, sputum production, shortness of breath and wheezing.   Cardiovascular:  Negative for chest pain, palpitations, orthopnea and claudication.  Gastrointestinal:  Negative for abdominal pain, blood in stool, constipation, diarrhea, heartburn, melena, nausea and vomiting.  Genitourinary:  Negative for dysuria, flank pain, frequency, hematuria and urgency.  Musculoskeletal:  Negative for back  pain, joint pain and myalgias.  Skin:  Negative for rash.  Neurological:  Negative for dizziness, tingling, focal weakness, seizures, weakness and headaches.  Endo/Heme/Allergies:  Does not bruise/bleed easily.   Psychiatric/Behavioral:  Negative for depression and suicidal ideas. The patient does not have insomnia.       Allergies  Allergen Reactions   Benadryl  [Diphenhydramine  Hcl]     Jittery and Hallucinations   Diphenhydramine  Hcl Anxiety    Other Reaction(s): Hallucination  Jittery and Hallucinations    Made her feel like she was crawling the walls.   Hydrocodone  Shortness Of Breath    Felt like she could not breathe-February 2020   Penicillins Hives and Rash    Did it involve swelling of the face/tongue/throat, SOB, or low BP? No  Did it involve sudden or severe rash/hives, skin peeling, or any reaction on the inside of your mouth or nose? No  Did you need to seek medical attention at a hospital or doctor's office? No  When did it last happen?        If all above answers are "NO", may proceed with cephalosporin use.  Any medicine related to PCN    Did it involve swelling of the face/tongue/throat, SOB, or low BP? No Did it involve sudden or severe rash/hives, skin peeling, or any reaction on the inside of your mouth or nose? No Did you need to seek medical attention at a hospital or doctor's office? No When did it last happen?       If all above answers are "NO", may proceed with cephalosporin use.   Ciprofloxacin Hives   Docetaxel  Other (See Comments)    Tingling of lips, teeth hurting, hot tongue   Doxycycline  Other (See Comments)    Mouth pain    Trastuzumab -Anns [Trastuzumab -Pkrb] Itching    Tingling and hot tongue and lips     Past Medical History:  Diagnosis Date   Adenocarcinoma of breast (HCC) 05/20/2011   Stage I (T1b N0), grade 1, well-differentiated adenocarcinoma of the medial lower quadrant of the left breast. It was a tubular carcinoma, status post lumpectomy on 03/13/2006, ER positive 79%, PR positive 92%, HER2/neu negative, Ki-67 marker low at 18%. Her cancer was 7 mm in size, and she had 4 sentinel nodes all negative along with her lumpectomy. She was  treated with radiation therapy postoper   Complication of anesthesia    pt states her bladder did not wake up after one surgery. Went to ER, had a urinary catheter for 1 week   History of kidney stones    Iron deficiency    Iron deficiency anemia 05/20/2011   Secondary to GU bleeding.  Good absorber of PO iron   Personal history of radiation therapy      Past Surgical History:  Procedure Laterality Date   BREAST BIOPSY Left 07/16/2022   US  LT BREAST BX W LOC DEV 1ST LESION IMG BX SPEC US  GUIDE 07/16/2022 Lennon Nest, MD [facility]   BREAST BIOPSY Right 07/26/2022   MM RT BREAST BX W LOC DEV 1ST LESION IMAGE BX SPEC STEREO GUIDE 07/26/2022 [facility]   BREAST LUMPECTOMY Left    EXTRACORPOREAL SHOCK WAVE LITHOTRIPSY Left 02/26/2018   Procedure: EXTRACORPOREAL SHOCK WAVE LITHOTRIPSY (ESWL);  Surgeon: Sherrilee Belvie CROME, MD;  Location: [facility];  Service: Urology;  Laterality: Left;   LUMPECTOM LEFT BREAST  02/2006   LYMPH NODE BIOPSY LEFT  04/2006   MASTECTOMY W/ SENTINEL NODE BIOPSY Left 08/15/2022   Procedure: LEFT  SIMPLE MASTECTOMY, LEFT SENTINEL LYMPH NODE MAPPING;  Surgeon: Vanderbilt Ned, MD;  Location: [facility];  Service: General;  Laterality: Left;   PORTACATH PLACEMENT N/A 10/09/2022   Procedure: INSERTION PORT-A-CATH;  Surgeon: Vanderbilt Ned, MD;  Location: [facility];  Service: General;  Laterality: N/A;   SIMPLE MASTECTOMY WITH AXILLARY SENTINEL NODE BIOPSY Right 08/15/2022   Procedure: RIGHT RISK REDUCING MASTECTOMY;  Surgeon: Vanderbilt Ned, MD;  Location: [facility];  Service: General;  Laterality: Right;    Social History   Socioeconomic History   Marital status: Married    Spouse name: Not on file   Number of children: Not on file   Years of education: Not on file   Highest education level: Some college, no degree  Occupational History   Not on file  Tobacco Use   Smoking status: Never   Smokeless tobacco:  Never  Vaping Use   Vaping status: Never Used  Substance and Sexual Activity   Alcohol use: No   Drug use: No   Sexual activity: Yes    Birth control/protection: Post-menopausal  Other Topics Concern   Not on file  Social History Narrative   Not on file   Social Drivers of Health   Financial Resource Strain: Patient Declined (10/16/2022)   Overall Financial Resource Strain (CARDIA)    Difficulty of Paying Living Expenses: Patient declined  Recent Concern: Physicist, medical Strain - High Risk (10/03/2022)   Overall Financial Resource Strain (CARDIA)    Difficulty of Paying Living Expenses: Hard  Food Insecurity: No Food Insecurity (11/27/2022)   Hunger Vital Sign    Worried About Running Out of Food in the Last Year: Never true    Ran Out of Food in the Last Year: Never true  Transportation Needs: No Transportation Needs (11/27/2022)   PRAPARE - Administrator, Civil Service (Medical): No    Lack of Transportation (Non-Medical): No  Physical Activity: Insufficiently Active (10/16/2022)   Exercise Vital Sign    Days of Exercise per Week: 1 day    Minutes of Exercise per Session: 20 min  Stress: No Stress Concern Present (10/16/2022)   [employer] of Occupational Health - Occupational Stress Questionnaire    Feeling of Stress : Not at all  Social Connections: Unknown (10/16/2022)   Social Connection and Isolation Panel    Frequency of Communication with Friends and Family: Patient declined    Frequency of Social Gatherings with Friends and Family: Patient declined    Attends Religious Services: More than 4 times per year    Active Member of [employer] or Organizations: Yes    Attends Engineer, structural: More than 4 times per year    Marital Status: Married  Catering manager Violence: Not At Risk (11/27/2022)   Humiliation, Afraid, Rape, and Kick questionnaire    Fear of Current or Ex-Partner: No    Emotionally Abused: No    Physically Abused: No     Sexually Abused: No    Family History  Problem Relation Age of Onset   Hypertension Mother    Hypertension Father    Heart disease Father 49       MI in his 61s   Cancer Maternal Aunt        unk type, possibly hip. dx >50   Breast cancer Paternal Aunt        dx 17s   Cancer Paternal Aunt        unknown, d.  83s   Prostate cancer Cousin        dx 40s-50s   Stomach cancer Cousin        dx 40s-50s     Current Outpatient Medications:    aluminum -magnesium  hydroxide 200-200 MG/5ML suspension, Take 15 mLs by mouth every 6 (six) hours as needed for indigestion. Mix 1:1 with Lidocaine  and swish and spit 4 times a day, Disp: 355 mL, Rfl: 2   Bioflavonoid Products (GRAPE SEED PO), Take 1 capsule by mouth daily., Disp: , Rfl:    Calcium Carbonate Antacid (TUMS PO), Take 1 tablet by mouth as needed. Reported on 05/22/2015, Disp: , Rfl:    CARBOPLATIN  IV, Inject into the vein every 21 ( twenty-one) days., Disp: , Rfl:    Cholecalciferol (VITAMIN D  PO), Take 1,000 Units by mouth daily., Disp: , Rfl:    diphenoxylate -atropine  (LOMOTIL ) 2.5-0.025 MG tablet, Take 1 tablet by mouth 4 (four) times daily as needed for diarrhea or loose stools., Disp: 60 tablet, Rfl: 0   DOCETAXEL  IV, Inject into the vein every 21 ( twenty-one) days., Disp: , Rfl:    lidocaine  (XYLOCAINE ) 2 % solution, Use as directed 15 mLs in the mouth or throat 4 (four) times daily. Mix 1:1 with maalox and swish and spit 4 times a day, Disp: 100 mL, Rfl: 2   lidocaine -prilocaine  (EMLA ) cream, Apply a quarter-sized amount to port a cath site and cover with plastic wrap 1 hour prior to infusion appointments, Disp: 30 g, Rfl: 3   loratadine  (CLARITIN ) 5 MG chewable tablet, Chew 5 mg by mouth daily., Disp: , Rfl:    montelukast  (SINGULAIR ) 10 MG tablet, Take one 10mg  tab for 2 days before chemo (day 1 and day 2), Disp: 20 tablet, Rfl: 0   Multiple Vitamin (MULTI-VITAMIN) tablet, Take 1 tablet by mouth daily., Disp: , Rfl:    ondansetron   (ZOFRAN ) 8 MG tablet, Take 1 tablet (8 mg total) by mouth every 8 (eight) hours as needed for nausea or vomiting., Disp: 20 tablet, Rfl: 0   PERTUZUMAB  IV, Inject into the vein every 21 ( twenty-one) days., Disp: , Rfl:    predniSONE  (DELTASONE ) 20 MG tablet, Take one 20mg  tab for 2 days before chemo (day 1 and day 2), Disp: 20 tablet, Rfl: 0   TRASTUZUMAB  IV, Inject into the vein every 21 ( twenty-one) days., Disp: , Rfl:    tamoxifen  (NOLVADEX ) 20 MG tablet, Take 1 tablet (20 mg total) by mouth daily. (Patient not taking: Reported on 09/17/2023), Disp: 30 tablet, Rfl: 3 No current facility-administered medications for this visit.  Facility-Administered Medications Ordered in Other Visits:    0.9 %  sodium chloride  infusion, , Intravenous, Continuous, Melanee Annah BROCKS, MD, Stopped at 06/25/23 1300   sodium chloride  flush (NS) 0.9 % injection 10 mL, 10 mL, Intracatheter, PRN, Melanee Annah BROCKS, MD, 10 mL at 06/25/23 1101  Physical exam:  Vitals:   09/17/23 1307  BP: 135/84  Pulse: 88  Resp: 16  Temp: 98.6 F (37 C)  TempSrc: Tympanic  SpO2: 99%  Weight: 90 lb 14.4 oz (41.2 kg)   Physical Exam Cardiovascular:     Rate and Rhythm: Normal rate and regular rhythm.     Heart sounds: Normal heart sounds.  Pulmonary:     Effort: Pulmonary effort is normal.     Breath sounds: Normal breath sounds.  Abdominal:     General: Bowel sounds are normal.     Palpations: Abdomen is soft.  Skin:  General: Skin is warm and dry.  Neurological:     Mental Status: She is alert and oriented to person, place, and time.      I have personally reviewed labs listed below:    Latest Ref Rng & Units 09/17/2023   12:33 PM  CMP  Glucose 70 - 99 mg/dL 94   BUN 6 - 20 mg/dL 18   Creatinine 9.55 - 1.00 mg/dL 9.39   Sodium 864 - 854 mmol/L 134   Potassium 3.5 - 5.1 mmol/L 3.9   Chloride 98 - 111 mmol/L 101   CO2 22 - 32 mmol/L 25   Calcium 8.9 - 10.3 mg/dL 8.7   Total Protein 6.5 - 8.1 g/dL 6.1   Total  Bilirubin 0.0 - 1.2 mg/dL 0.5   Alkaline Phos 38 - 126 U/L 50   AST 15 - 41 U/L 19   ALT 0 - 44 U/L 18       Latest Ref Rng & Units 09/17/2023   12:33 PM  CBC  WBC 4.0 - 10.5 K/uL 4.8   Hemoglobin 12.0 - 15.0 g/dL 87.9   Hematocrit 63.9 - 46.0 % 36.1   Platelets 150 - 400 K/uL 163       Assessment and plan- Patient is a 58 y.o. female with history of BRCA2 mutation and second left breast cancer stage Ib MP T2 N0 versus pT1 cN1 M0 ER/PR positive HER2 positive.  She is here for on treatment assessment prior to cycle 15 of adjuvant Herceptin   Counts okay to proceed with cycle 15 of adjuvant HerceptinToday.  I will see her back in 3 weeks for cycle 16 which would be her last cycle.  Patient has ER-positive disease but is hesitant to start endocrine therapy at this time as she is fearful of side effects.  She did take 20 mg of tamoxifen  briefly but could not tolerated due to mood swings.  She is thinking of taking half the dose of tamoxifen  eventually but she wants to wait for her daughter to undergo her hysterectomy surgery next month and then start taking it sometime in November.  Will plan for port removal upon completion of Herceptin .  She will need echocardiogram towards the end of October   Visit Diagnosis 1. Malignant neoplasm of upper-outer quadrant of left breast in female, estrogen receptor positive (HCC)   2. Encounter for monoclonal antibody treatment for malignancy      Dr. Annah Skene, MD, MPH [facility] [phone] 09/21/2023 10:51 AM

## 2023-10-08 ENCOUNTER — Inpatient Hospital Stay

## 2023-10-08 ENCOUNTER — Inpatient Hospital Stay: Admitting: Oncology

## 2023-10-08 ENCOUNTER — Other Ambulatory Visit: Payer: Self-pay

## 2023-10-08 VITALS — BP 147/89 | HR 109 | Temp 96.5°F | Resp 17 | Ht 63.0 in | Wt 90.4 lb

## 2023-10-08 VITALS — HR 94

## 2023-10-08 DIAGNOSIS — Z170 Estrogen receptor positive status [ER+]: Secondary | ICD-10-CM

## 2023-10-08 DIAGNOSIS — Z95828 Presence of other vascular implants and grafts: Secondary | ICD-10-CM | POA: Diagnosis not present

## 2023-10-08 DIAGNOSIS — C50412 Malignant neoplasm of upper-outer quadrant of left female breast: Secondary | ICD-10-CM

## 2023-10-08 DIAGNOSIS — Z5112 Encounter for antineoplastic immunotherapy: Secondary | ICD-10-CM

## 2023-10-08 MED ORDER — CETIRIZINE HCL 10 MG/ML IV SOLN
10.0000 mg | Freq: Once | INTRAVENOUS | Status: AC
  Administered 2023-10-08: 10 mg via INTRAVENOUS
  Filled 2023-10-08: qty 1

## 2023-10-08 MED ORDER — TRASTUZUMAB-ANNS CHEMO 150 MG IV SOLR
6.0000 mg/kg | Freq: Once | INTRAVENOUS | Status: AC
  Administered 2023-10-08: 252 mg via INTRAVENOUS
  Filled 2023-10-08: qty 12

## 2023-10-08 MED ORDER — ACETAMINOPHEN 325 MG PO TABS
650.0000 mg | ORAL_TABLET | Freq: Once | ORAL | Status: AC
  Administered 2023-10-08: 650 mg via ORAL
  Filled 2023-10-08: qty 2

## 2023-10-08 MED ORDER — FAMOTIDINE IN NACL 20-0.9 MG/50ML-% IV SOLN
20.0000 mg | Freq: Once | INTRAVENOUS | Status: AC
  Administered 2023-10-08: 20 mg via INTRAVENOUS
  Filled 2023-10-08: qty 50

## 2023-10-08 MED ORDER — METHYLPREDNISOLONE SODIUM SUCC 125 MG IJ SOLR
125.0000 mg | Freq: Once | INTRAMUSCULAR | Status: AC
  Administered 2023-10-08: 125 mg via INTRAVENOUS
  Filled 2023-10-08: qty 2

## 2023-10-08 MED ORDER — SODIUM CHLORIDE 0.9 % IV SOLN
INTRAVENOUS | Status: DC
  Filled 2023-10-08: qty 250

## 2023-10-08 NOTE — Progress Notes (Signed)
 Hematology/Oncology Consult note [facility]  [phone] [phone]  Patient Care Team: Alphonsa Glendia LABOR, MD as PCP - General (Family Medicine) Celestia Joesph SQUIBB, RN as Oncology Nurse Navigator (Medical Oncology) Melanee Annah BROCKS, MD as Consulting Physician (Oncology) Maurie Rayfield BIRCH, RN as Oncology Nurse Navigator Lenn Aran, MD as Consulting Physician (Radiation Oncology)   Name of the patient: Melissa Rodriguez  [id number]  [birth year]   Date of visit: 10/08/23  Diagnosis- pathologic prognostic stage Ib invasive mammary carcinoma of the left breast T2 N1 M0 ER/PR positive HER2 positive   Chief complaint/ Reason for visit-on treatment assessment prior to cycle 15 of adjuvant Herceptin   Heme/Onc history: Patient is a 58 year old female with a prior history of lumpectomy in 2008 which showed a 7 mm stage I ER/PR positive HER2 negative left breast cancer.  She underwent lumpectomy and adjuvant radiation therapy but did not go through endocrine therapy.   She self palpated a left breast lump led to a diagnostic mammogram in July 2024.  Diagnostic mammogram showed 1.9 cm irregular hypoechoic mass 1 o'clock position 7 cm from the breast grade 3 ER 99% positive, PR 85% positive and HER2 equivocal by IHC and FISH positive.  Ki-67 35% there were calcifications noted in the right breast as well which were biopsy-proven benign.     She underwent genetic testing and was found to have BRCA2 mutation   Patient was seen by Dr. Rogers and he recommended upfront surgery and consideration for adjuvant chemotherapy.  Patient underwent bilateral mastectomy on 08/15/2022.  No evidence of malignancy in the right breast.  There were 2 distinct masses noted in the left breast with dominant mass measured 2.4 x 2.3 x 1.6 cm grade 3 with negative margins.  There was another 1 cm mass where there was uptake of radiotracer dye with positive posterior lateral anterior  margin.  This was believed to be the sentinel lymph node although on pathology it states that there were no lymph nodes identified likely due to prior lumpectomy. mpT2.  She was staged as T2 N1 triple positive disease.  Patient completed 6 cycles of adjuvant TCHP chemotherapy and is currently on maintenance Herceptin  only.  She could not tolerate dual Herceptin  and Perjeta .  She completed adjuvant radiation   Patient completed 6 cycles of adjuvant TCHP chemotherapy and is presently on maintenance Herceptin  and Perjeta .  Patient found to have 7.9 cm lobulated left ovarian mass which was resected at [facility] in March 2025.  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  No pathologic diagnosis was noted in the uterus contralateral ovary and fallopian tube.    Interval history-tolerating single agent Herceptin  well so far.  Denies any specific complaints at this time  ECOG PS- 1 Pain scale- 0   Review of systems- Review of Systems  Constitutional:  Negative for chills, fever, malaise/fatigue and weight loss.  HENT:  Negative for congestion, ear discharge and nosebleeds.   Eyes:  Negative for blurred vision.  Respiratory:  Negative for cough, hemoptysis, sputum production, shortness of breath and wheezing.   Cardiovascular:  Negative for chest pain, palpitations, orthopnea and claudication.  Gastrointestinal:  Negative for abdominal pain, blood in stool, constipation, diarrhea, heartburn, melena, nausea and vomiting.  Genitourinary:  Negative for dysuria, flank pain, frequency, hematuria and urgency.  Musculoskeletal:  Negative for back pain, joint pain and myalgias.  Skin:  Negative for rash.  Neurological:  Negative for  dizziness, tingling, focal weakness, seizures, weakness and headaches.  Endo/Heme/Allergies:  Does not bruise/bleed easily.  Psychiatric/Behavioral:  Negative for depression and suicidal ideas. The patient does not have insomnia.        Allergies  Allergen Reactions   Benadryl  [Diphenhydramine  Hcl]     Jittery and Hallucinations   Diphenhydramine  Hcl Anxiety    Other Reaction(s): Hallucination  Jittery and Hallucinations    Made her feel like she was crawling the walls.   Hydrocodone  Shortness Of Breath    Felt like she could not breathe-February 2020   Penicillins Hives and Rash    Did it involve swelling of the face/tongue/throat, SOB, or low BP? No  Did it involve sudden or severe rash/hives, skin peeling, or any reaction on the inside of your mouth or nose? No  Did you need to seek medical attention at a hospital or doctor's office? No  When did it last happen?        If all above answers are "NO", may proceed with cephalosporin use.  Any medicine related to PCN    Did it involve swelling of the face/tongue/throat, SOB, or low BP? No Did it involve sudden or severe rash/hives, skin peeling, or any reaction on the inside of your mouth or nose? No Did you need to seek medical attention at a hospital or doctor's office? No When did it last happen?       If all above answers are "NO", may proceed with cephalosporin use.   Ciprofloxacin Hives   Docetaxel  Other (See Comments)    Tingling of lips, teeth hurting, hot tongue   Doxycycline  Other (See Comments)    Mouth pain    Trastuzumab -Anns [Trastuzumab -Pkrb] Itching    Tingling and hot tongue and lips     Past Medical History:  Diagnosis Date   Adenocarcinoma of breast (HCC) 05/20/2011   Stage I (T1b N0), grade 1, well-differentiated adenocarcinoma of the medial lower quadrant of the left breast. It was a tubular carcinoma, status post lumpectomy on 03/13/2006, ER positive 79%, PR positive 92%, HER2/neu negative, Ki-67 marker low at 18%. Her cancer was 7 mm in size, and she had 4 sentinel nodes all negative along with her lumpectomy. She was treated with radiation therapy postoper   Complication of anesthesia    pt states her bladder did not wake up  after one surgery. Went to ER, had a urinary catheter for 1 week   History of kidney stones    Iron deficiency    Iron deficiency anemia 05/20/2011   Secondary to GU bleeding.  Good absorber of PO iron   Personal history of radiation therapy      Past Surgical History:  Procedure Laterality Date   BREAST BIOPSY Left 07/16/2022   US  LT BREAST BX W LOC DEV 1ST LESION IMG BX SPEC US  GUIDE 07/16/2022 Lennon Nest, MD [facility]   BREAST BIOPSY Right 07/26/2022   MM RT BREAST BX W LOC DEV 1ST LESION IMAGE BX SPEC STEREO GUIDE 07/26/2022 [facility]   BREAST LUMPECTOMY Left    EXTRACORPOREAL SHOCK WAVE LITHOTRIPSY Left 02/26/2018   Procedure: EXTRACORPOREAL SHOCK WAVE LITHOTRIPSY (ESWL);  Surgeon: Sherrilee Belvie CROME, MD;  Location: [facility];  Service: Urology;  Laterality: Left;   LUMPECTOM LEFT BREAST  02/2006   LYMPH NODE BIOPSY LEFT  04/2006   MASTECTOMY W/ SENTINEL NODE BIOPSY Left 08/15/2022   Procedure: LEFT SIMPLE MASTECTOMY, LEFT SENTINEL LYMPH NODE MAPPING;  Surgeon: Vanderbilt Ned, MD;  Location: [facility];  Service: General;  Laterality: Left;   PORTACATH PLACEMENT N/A 10/09/2022   Procedure: INSERTION PORT-A-CATH;  Surgeon: Vanderbilt Ned, MD;  Location: [facility];  Service: General;  Laterality: N/A;   SIMPLE MASTECTOMY WITH AXILLARY SENTINEL NODE BIOPSY Right 08/15/2022   Procedure: RIGHT RISK REDUCING MASTECTOMY;  Surgeon: Vanderbilt Ned, MD;  Location: [facility];  Service: General;  Laterality: Right;    Social History   Socioeconomic History   Marital status: Married    Spouse name: Not on file   Number of children: Not on file   Years of education: Not on file   Highest education level: Some college, no degree  Occupational History   Not on file  Tobacco Use   Smoking status: Never   Smokeless tobacco: Never  Vaping Use   Vaping status: Never Used  Substance and Sexual Activity   Alcohol use: No   Drug use: No    Sexual activity: Yes    Birth control/protection: Post-menopausal  Other Topics Concern   Not on file  Social History Narrative   Not on file   Social Drivers of Health   Financial Resource Strain: Patient Declined (10/16/2022)   Overall Financial Resource Strain (CARDIA)    Difficulty of Paying Living Expenses: Patient declined  Recent Concern: Physicist, medical Strain - High Risk (10/03/2022)   Overall Financial Resource Strain (CARDIA)    Difficulty of Paying Living Expenses: Hard  Food Insecurity: No Food Insecurity (11/27/2022)   Hunger Vital Sign    Worried About Running Out of Food in the Last Year: Never true    Ran Out of Food in the Last Year: Never true  Transportation Needs: No Transportation Needs (11/27/2022)   PRAPARE - Administrator, Civil Service (Medical): No    Lack of Transportation (Non-Medical): No  Physical Activity: Insufficiently Active (10/16/2022)   Exercise Vital Sign    Days of Exercise per Week: 1 day    Minutes of Exercise per Session: 20 min  Stress: No Stress Concern Present (10/16/2022)   [employer] of Occupational Health - Occupational Stress Questionnaire    Feeling of Stress : Not at all  Social Connections: Unknown (10/16/2022)   Social Connection and Isolation Panel    Frequency of Communication with Friends and Family: Patient declined    Frequency of Social Gatherings with Friends and Family: Patient declined    Attends Religious Services: More than 4 times per year    Active Member of [employer] or Organizations: Yes    Attends Engineer, structural: More than 4 times per year    Marital Status: Married  Catering manager Violence: Not At Risk (11/27/2022)   Humiliation, Afraid, Rape, and Kick questionnaire    Fear of Current or Ex-Partner: No    Emotionally Abused: No    Physically Abused: No    Sexually Abused: No    Family History  Problem Relation Age of Onset   Hypertension Mother    Hypertension  Father    Heart disease Father 27       MI in his 59s   Cancer Maternal Aunt        unk type, possibly hip. dx >50   Breast cancer Paternal Aunt        dx 2s   Cancer Paternal Aunt        unknown, d. 40s   Prostate cancer Cousin        dx 60s-50s  Stomach cancer Cousin        dx 40s-50s     Current Outpatient Medications:    Calcium Carbonate Antacid (TUMS PO), Take 1 tablet by mouth as needed. Reported on 05/22/2015, Disp: , Rfl:    Cholecalciferol (VITAMIN D  PO), Take 1,000 Units by mouth daily., Disp: , Rfl:    diphenoxylate -atropine  (LOMOTIL ) 2.5-0.025 MG tablet, Take 1 tablet by mouth 4 (four) times daily as needed for diarrhea or loose stools., Disp: 60 tablet, Rfl: 0   lidocaine -prilocaine  (EMLA ) cream, Apply a quarter-sized amount to port a cath site and cover with plastic wrap 1 hour prior to infusion appointments, Disp: 30 g, Rfl: 3   loratadine  (CLARITIN ) 5 MG chewable tablet, Chew 5 mg by mouth daily. (Patient taking differently: Chew 5 mg by mouth as needed.), Disp: , Rfl:    montelukast  (SINGULAIR ) 10 MG tablet, Take one 10mg  tab for 2 days before chemo (day 1 and day 2), Disp: 20 tablet, Rfl: 0   Multiple Vitamin (MULTI-VITAMIN) tablet, Take 1 tablet by mouth daily., Disp: , Rfl:    ondansetron  (ZOFRAN ) 8 MG tablet, Take 1 tablet (8 mg total) by mouth every 8 (eight) hours as needed for nausea or vomiting., Disp: 20 tablet, Rfl: 0   predniSONE  (DELTASONE ) 20 MG tablet, Take one 20mg  tab for 2 days before chemo (day 1 and day 2), Disp: 20 tablet, Rfl: 0   aluminum -magnesium  hydroxide 200-200 MG/5ML suspension, Take 15 mLs by mouth every 6 (six) hours as needed for indigestion. Mix 1:1 with Lidocaine  and swish and spit 4 times a day, Disp: 355 mL, Rfl: 2   Bioflavonoid Products (GRAPE SEED PO), Take 1 capsule by mouth daily. (Patient not taking: Reported on 10/08/2023), Disp: , Rfl:    CARBOPLATIN  IV, Inject into the vein every 21 ( twenty-one) days., Disp: , Rfl:     DOCETAXEL  IV, Inject into the vein every 21 ( twenty-one) days., Disp: , Rfl:    lidocaine  (XYLOCAINE ) 2 % solution, Use as directed 15 mLs in the mouth or throat 4 (four) times daily. Mix 1:1 with maalox and swish and spit 4 times a day (Patient not taking: Reported on 10/08/2023), Disp: 100 mL, Rfl: 2   PERTUZUMAB  IV, Inject into the vein every 21 ( twenty-one) days., Disp: , Rfl:    tamoxifen  (NOLVADEX ) 20 MG tablet, Take 1 tablet (20 mg total) by mouth daily. (Patient not taking: Reported on 10/08/2023), Disp: 30 tablet, Rfl: 3   TRASTUZUMAB  IV, Inject into the vein every 21 ( twenty-one) days., Disp: , Rfl:  No current facility-administered medications for this visit.  Facility-Administered Medications Ordered in Other Visits:    0.9 %  sodium chloride  infusion, , Intravenous, Continuous, Melanee Annah BROCKS, MD, Stopped at 06/25/23 1300   0.9 %  sodium chloride  infusion, , Intravenous, Continuous, Melanee Annah BROCKS, MD, Last Rate: 10 mL/hr at 10/08/23 1003, New Bag at 10/08/23 1003   sodium chloride  flush (NS) 0.9 % injection 10 mL, 10 mL, Intracatheter, PRN, Melanee Annah BROCKS, MD, 10 mL at 06/25/23 1101  Physical exam:  Vitals:   10/08/23 0911 10/08/23 0915  BP: (!) 151/84 (!) 147/89  Pulse: (!) 118 (!) 109  Resp: 17   Temp: (!) 96.5 F (35.8 C)   TempSrc: Tympanic   SpO2: 99%   Weight: 90 lb 6.4 oz (41 kg)   Height: 5' 3 (1.6 m)    Physical Exam Cardiovascular:     Rate and Rhythm: Normal rate and  regular rhythm.     Heart sounds: Normal heart sounds.  Pulmonary:     Effort: Pulmonary effort is normal.     Breath sounds: Normal breath sounds.  Skin:    General: Skin is warm and dry.  Neurological:     Mental Status: She is alert and oriented to person, place, and time.      I have personally reviewed labs listed below:    Latest Ref Rng & Units 09/17/2023   12:33 PM  CMP  Glucose 70 - 99 mg/dL 94   BUN 6 - 20 mg/dL 18   Creatinine 9.55 - 1.00 mg/dL 9.39   Sodium 864 - 854  mmol/L 134   Potassium 3.5 - 5.1 mmol/L 3.9   Chloride 98 - 111 mmol/L 101   CO2 22 - 32 mmol/L 25   Calcium 8.9 - 10.3 mg/dL 8.7   Total Protein 6.5 - 8.1 g/dL 6.1   Total Bilirubin 0.0 - 1.2 mg/dL 0.5   Alkaline Phos 38 - 126 U/L 50   AST 15 - 41 U/L 19   ALT 0 - 44 U/L 18       Latest Ref Rng & Units 09/17/2023   12:33 PM  CBC  WBC 4.0 - 10.5 K/uL 4.8   Hemoglobin 12.0 - 15.0 g/dL 87.9   Hematocrit 63.9 - 46.0 % 36.1   Platelets 150 - 400 K/uL 163     Assessment and plan- Patient is a 58 y.o. female  with history of BRCA2 mutation and second left breast cancer stage Ib MP T2 N0 versus pT1 cN1 M0 ER/PR positive HER2 positive.  She is here for on treatment assessment prior to cycle 16 of adjuvant Herceptin   Counts okay to proceed with cycle 16 of adjuvant Herceptin  today which would be her last cycle.  She has ER-positive disease and previously we discussed various options including aromatase inhibitor versus tamoxifen .  She has baseline osteoporosis and therefore was concerned about the use of aromatase inhibitors.  She could not tolerate full 20 mg of tamoxifen .  Options at this time include  1.  Trying aromatase inhibitor but considering treatment options for osteoporosis such as weekly Fosamax or monthly Boniva or Xgeva injections every 6 months 2. Retrying full dose of tamoxifen  20 mg 3. Trying half dose of tamoxifen  10 mg which is better than not taking any endocrine therapy.  Patient would like to think about her options and let me know when she meets me in 3 months time.  Will plan to take her port out at this time   Visit Diagnosis 1. Encounter for monoclonal antibody treatment for malignancy   2. Port-A-Cath in place   3. Malignant neoplasm of upper-outer quadrant of left breast in female, estrogen receptor positive (HCC)      Dr. Annah Skene, MD, MPH [facility] at [facility] [phone] 10/08/2023 1:07 PM

## 2023-10-08 NOTE — Patient Instructions (Signed)
 [facility] - A DEPT OF [facility]  Discharge Instructions: Thank you for choosing [facility] to provide your oncology and hematology care.  If you have a lab appointment with the Cancer Center, please go directly to the Cancer Center and check in at the registration area.  Wear comfortable clothing and clothing appropriate for easy access to any Portacath or PICC line.   We strive to give you quality time with your provider. You may need to reschedule your appointment if you arrive late (15 or more minutes).  Arriving late affects you and other patients whose appointments are after yours.  Also, if you miss three or more appointments without notifying the office, you may be dismissed from the clinic at the provider's discretion.      For prescription refill requests, have your pharmacy contact our office and allow 72 hours for refills to be completed.    Today you received the following chemotherapy and/or immunotherapy agents:trastuzumab     To help prevent nausea and vomiting after your treatment, we encourage you to take your nausea medication as directed.  BELOW ARE SYMPTOMS THAT SHOULD BE REPORTED IMMEDIATELY: *FEVER GREATER THAN 100.4 F (38 C) OR HIGHER *CHILLS OR SWEATING *NAUSEA AND VOMITING THAT IS NOT CONTROLLED WITH YOUR NAUSEA MEDICATION *UNUSUAL SHORTNESS OF BREATH *UNUSUAL BRUISING OR BLEEDING *URINARY PROBLEMS (pain or burning when urinating, or frequent urination) *BOWEL PROBLEMS (unusual diarrhea, constipation, pain near the anus) TENDERNESS IN MOUTH AND THROAT WITH OR WITHOUT PRESENCE OF ULCERS (sore throat, sores in mouth, or a toothache) UNUSUAL RASH, SWELLING OR PAIN  UNUSUAL VAGINAL DISCHARGE OR ITCHING   Items with * indicate a potential emergency and should be followed up as soon as possible or go to the Emergency Department if any problems should occur.  Please show the CHEMOTHERAPY ALERT CARD or IMMUNOTHERAPY  ALERT CARD at check-in to the Emergency Department and triage nurse.  Should you have questions after your visit or need to cancel or reschedule your appointment, please contact [facility] - A DEPT OF [facility]  [phone] and follow the prompts.  Office hours are 8:00 a.m. to 4:30 p.m. Monday - Friday. Please note that voicemails left after 4:00 p.m. may not be returned until the following business day.  We are closed weekends and major holidays. You have access to a nurse at all times for urgent questions. Please call the main number to the clinic [phone] and follow the prompts.  For any non-urgent questions, you may also contact your provider using MyChart. We now offer e-Visits for anyone 26 and older to request care online for non-urgent symptoms. For details visit [email or web address].   Also download the MyChart app! Go to the app store, search "MyChart", open the app, select [place], and log in with your MyChart username and password.

## 2023-10-08 NOTE — Progress Notes (Signed)
 tamoxifen  took only 8 days - terrible stomach and back pain, hot flashes, vaginal dryness.  GI upset only if she eats something isn't agreeable with her.  Daughter has BRCA gene and will undergo double masectomy in the near future and patient will help care for family.

## 2023-10-08 NOTE — Progress Notes (Signed)
 Ok to treat HR 109, rechecked now 42

## 2023-10-16 ENCOUNTER — Ambulatory Visit: Payer: Self-pay | Admitting: Surgery

## 2023-10-21 ENCOUNTER — Ambulatory Visit: Payer: Commercial Managed Care - PPO | Admitting: Dermatology

## 2023-10-21 DIAGNOSIS — L814 Other melanin hyperpigmentation: Secondary | ICD-10-CM | POA: Diagnosis not present

## 2023-10-21 DIAGNOSIS — Z1283 Encounter for screening for malignant neoplasm of skin: Secondary | ICD-10-CM

## 2023-10-21 DIAGNOSIS — D2272 Melanocytic nevi of left lower limb, including hip: Secondary | ICD-10-CM

## 2023-10-21 DIAGNOSIS — L578 Other skin changes due to chronic exposure to nonionizing radiation: Secondary | ICD-10-CM

## 2023-10-21 DIAGNOSIS — L821 Other seborrheic keratosis: Secondary | ICD-10-CM | POA: Diagnosis not present

## 2023-10-21 DIAGNOSIS — W908XXA Exposure to other nonionizing radiation, initial encounter: Secondary | ICD-10-CM

## 2023-10-21 DIAGNOSIS — L820 Inflamed seborrheic keratosis: Secondary | ICD-10-CM

## 2023-10-21 DIAGNOSIS — D1801 Hemangioma of skin and subcutaneous tissue: Secondary | ICD-10-CM

## 2023-10-21 DIAGNOSIS — D229 Melanocytic nevi, unspecified: Secondary | ICD-10-CM

## 2023-10-21 NOTE — Progress Notes (Signed)
   New Patient Visit   Subjective  Melissa Rodriguez is a 58 y.o. female who presents for the following: Skin Cancer Screening and Full Body Skin Exam  The patient presents for Total-Body Skin Exam (TBSE) for skin cancer screening and mole check. The patient has spots, moles and lesions to be evaluated, some may be new or changing, including right sideburn and scalp that are irritating. The patient finished radiation for breast cancer in July 2025. She has a couple of spots on the chest and shoulder to check, not bothersome. History of BCC of the right lateral canthus, early 2000s? Patient grew up in [place] .   This patient is accompanied in the office by her husband.   The following portions of the chart were reviewed this encounter and updated as appropriate: medications, allergies, medical history  Review of Systems:  No other skin or systemic complaints except as noted in HPI or Assessment and Plan.  Objective  Well appearing patient in no apparent distress; mood and affect are within normal limits.  A full examination was performed including scalp, head, eyes, ears, nose, lips, neck, chest, axillae, abdomen, back, buttocks, bilateral upper extremities, bilateral lower extremities, hands, feet, fingers, toes, fingernails, and toenails. All findings within normal limits unless otherwise noted below.   Relevant physical exam findings are noted in the Assessment and Plan.  vertex scalp x 1, R sideburn x 1 (2) Erythematous stuck-on, waxy papule or plaque  Assessment & Plan   SKIN CANCER SCREENING PERFORMED TODAY.  ACTINIC DAMAGE - Chronic condition, secondary to cumulative UV/sun exposure - diffuse scaly erythematous macules with underlying dyspigmentation - Recommend daily broad spectrum sunscreen SPF 30+ to sun-exposed areas, reapply every 2 hours as needed.  - Staying in the shade or wearing long sleeves, sun glasses (UVA+UVB protection) and wide brim hats (4-inch brim around the  entire circumference of the hat) are also recommended for sun protection.  - Call for new or changing lesions.  LENTIGINES, SEBORRHEIC KERATOSES (including L shoulder, chest, R forehead and temple/hair line), HEMANGIOMAS - Benign normal skin lesions - Benign-appearing - Call for any changes  MELANOCYTIC NEVI - Tan-brown and/or pink-flesh-colored symmetric macules and papules - L lateral thigh 2.0 mm med dark brown macule - Benign appearing on exam today - Observation - Call clinic for new or changing moles - Recommend daily use of broad spectrum spf 30+ sunscreen to sun-exposed areas.  INFLAMED SEBORRHEIC KERATOSIS (2) vertex scalp x 1, R sideburn x 1 (2) Symptomatic, irritating, patient would like treated. Destruction of lesion - vertex scalp x 1, R sideburn x 1 (2)  Destruction method: cryotherapy   Informed consent: discussed and consent obtained   Lesion destroyed using liquid nitrogen: Yes   Region frozen until ice ball extended beyond lesion: Yes   Outcome: patient tolerated procedure well with no complications   Post-procedure details: wound care instructions given   Additional details:  Prior to procedure, discussed risks of blister formation, small wound, skin dyspigmentation, or rare scar following cryotherapy. Recommend Vaseline ointment to treated areas while healing.    Return in about 1 year (around 10/20/2024) for TBSE.  IAndrea Kerns, CMA, am acting as scribe for Rexene Rattler, MD .   Documentation: I have reviewed the above documentation for accuracy and completeness, and I agree with the above.  Rexene Rattler, MD

## 2023-10-21 NOTE — Patient Instructions (Addendum)
 Cryotherapy Aftercare  Wash gently with soap and water everyday.   Apply Vaseline and Band-Aid daily until healed.   Seborrheic Keratosis  What causes seborrheic keratoses? Seborrheic keratoses are harmless, common skin growths that first appear during adult life.  As time goes by, more growths appear.  Some people may develop a large number of them.  Seborrheic keratoses appear on both covered and uncovered body parts.  They are not caused by sunlight.  The tendency to develop seborrheic keratoses can be inherited.  They vary in color from skin-colored to gray, brown, or even black.  They can be either smooth or have a rough, warty surface.   Seborrheic keratoses are superficial and look as if they were stuck on the skin.  Under the microscope this type of keratosis looks like layers upon layers of skin.  That is why at times the top layer may seem to fall off, but the rest of the growth remains and re-grows.    Treatment Seborrheic keratoses do not need to be treated, but can easily be removed in the office.  Seborrheic keratoses often cause symptoms when they rub on clothing or jewelry.  Lesions can be in the way of shaving.  If they become inflamed, they can cause itching, soreness, or burning.  Removal of a seborrheic keratosis can be accomplished by freezing, burning, or surgery. If any spot bleeds, scabs, or grows rapidly, please return to have it checked, as these can be an indication of a skin cancer.    Due to recent changes in healthcare laws, you may see results of your pathology and/or laboratory studies on MyChart before the doctors have had a chance to review them. We understand that in some cases there may be results that are confusing or concerning to you. Please understand that not all results are received at the same time and often the doctors may need to interpret multiple results in order to provide you with the best plan of care or course of treatment. Therefore, we ask that  you please give us  2 business days to thoroughly review all your results before contacting the office for clarification. Should we see a critical lab result, you will be contacted sooner.   If You Need Anything After Your Visit  If you have any questions or concerns for your doctor, please call our main line at [phone] and press option 4 to reach your doctor's medical assistant. If no one answers, please leave a voicemail as directed and we will return your call as soon as possible. Messages left after 4 pm will be answered the following business day.   You may also send us  a message via MyChart. We typically respond to MyChart messages within 1-2 business days.  For prescription refills, please ask your pharmacy to contact our office. Our fax number is [phone].  If you have an urgent issue when the clinic is closed that cannot wait until the next business day, you can page your doctor at the number below.    Please note that while we do our best to be available for urgent issues outside of office hours, we are not available 24/7.   If you have an urgent issue and are unable to reach us , you may choose to seek medical care at your doctor's office, retail clinic, urgent care center, or emergency room.  If you have a medical emergency, please immediately call 911 or go to the emergency department.  Pager Numbers  - Dr. Hester: [phone]  -  Dr. Jackquline: [phone]  - Dr. Claudene: [phone]   - Dr. Raymund: [phone]  In the event of inclement weather, please call our main line at [phone] for an update on the status of any delays or closures.  Dermatology Medication Tips: Please keep the boxes that topical medications come in in order to help keep track of the instructions about where and how to use these. Pharmacies typically print the medication instructions only on the boxes and not directly on the medication tubes.   If your medication is too expensive,  please contact our office at [phone] option 4 or send us  a message through MyChart.   We are unable to tell what your co-pay for medications will be in advance as this is different depending on your insurance coverage. However, we may be able to find a substitute medication at lower cost or fill out paperwork to get insurance to cover a needed medication.   If a prior authorization is required to get your medication covered by your insurance company, please allow us  1-2 business days to complete this process.  Drug prices often vary depending on where the prescription is filled and some pharmacies may offer cheaper prices.  The website www.goodrx.com contains coupons for medications through different pharmacies. The prices here do not account for what the cost may be with help from insurance (it may be cheaper with your insurance), but the website can give you the price if you did not use any insurance.  - You can print the associated coupon and take it with your prescription to the pharmacy.  - You may also stop by our office during regular business hours and pick up a GoodRx coupon card.  - If you need your prescription sent electronically to a different pharmacy, notify our office through [facility] or by phone at [phone] option 4.     Si Usted Necesita Algo Despus de Su Visita  Tambin puede enviarnos un mensaje a travs de Clinical cytogeneticist. Por lo general respondemos a los mensajes de MyChart en el transcurso de 1 a 2 das hbiles.  Para renovar recetas, por favor pida a su farmacia que se ponga en contacto con nuestra oficina. Randi lakes de fax es [place] [phone].  Si tiene un asunto urgente cuando la clnica est cerrada y que no puede esperar hasta el siguiente da hbil, puede llamar/localizar a su doctor(a) al nmero que aparece a continuacin.   Por favor, tenga en cuenta que aunque hacemos todo lo posible para estar disponibles para asuntos urgentes fuera del  horario de [place], no estamos disponibles las 24 horas del da, los 7 [address]  [address] de la [place].   Si tiene un problema urgente y no puede comunicarse con nosotros, puede optar por buscar atencin mdica  en el consultorio de su doctor(a), en una clnica privada, en un centro de atencin urgente o en una sala de emergencias.  Si tiene Engineer, drilling, por favor llame inmediatamente al 911 o vaya a la sala de emergencias.  Nmeros de bper  - Dr. Hester: [phone]  - Dra. Jackquline: [phone]  - Dr. Claudene: [phone]  - Dra. Kitts: [phone]  En caso de inclemencias del [place], por favor llame a nuestra lnea principal al [phone] para una actualizacin sobre el estado de cualquier retraso o cierre.  Consejos para la medicacin en dermatologa: Por favor, guarde las cajas en las que vienen los medicamentos de uso tpico para ayudarle a seguir las instrucciones sobre dnde y cmo usarlos. [employer]  generalmente imprimen las instrucciones del medicamento slo en las cajas y no directamente en los tubos del [place].   Si su medicamento es muy caro, por favor, pngase en contacto con landry rieger llamando al [phone] y presione la opcin 4 o envenos un mensaje a travs de Clinical cytogeneticist.   No podemos decirle cul ser su copago por los medicamentos por adelantado ya que esto es diferente dependiendo de la cobertura de su seguro. Sin embargo, es posible que podamos encontrar un medicamento sustituto a Audiological scientist un formulario para que el seguro cubra el medicamento que se considera necesario.   Si se requiere una autorizacin previa para que su compaa de seguros malta su medicamento, por favor permtanos de 1 a 2 das hbiles para completar este proceso.  Los precios de los medicamentos varan con frecuencia dependiendo del Environmental consultant de dnde se surte la receta y alguna farmacias pueden ofrecer precios ms baratos.  El sitio web www.goodrx.com tiene cupones para  medicamentos de Health and safety inspector. Los precios aqu no tienen en cuenta lo que podra costar con la ayuda del seguro (puede ser ms barato con su seguro), pero el sitio web puede darle el precio si no utiliz Tourist information centre manager.  - Puede imprimir el cupn correspondiente y llevarlo con su receta a la farmacia.  - Tambin puede pasar por nuestra oficina durante el horario de atencin regular y Education officer, museum una tarjeta de cupones de GoodRx.  - Si necesita que su receta se enve electrnicamente a una farmacia diferente, informe a nuestra oficina a travs de MyChart de [place] o por telfono llamando al [phone] y presione la opcin 4.

## 2023-10-22 ENCOUNTER — Encounter (HOSPITAL_BASED_OUTPATIENT_CLINIC_OR_DEPARTMENT_OTHER): Payer: Self-pay | Admitting: Surgery

## 2023-10-22 ENCOUNTER — Other Ambulatory Visit: Payer: Self-pay

## 2023-10-23 ENCOUNTER — Other Ambulatory Visit: Payer: Self-pay

## 2023-10-29 NOTE — Anesthesia Preprocedure Evaluation (Signed)
 Anesthesia Evaluation  Patient identified by MRN, date of birth, ID band Patient awake    Reviewed: Allergy & Precautions, NPO status , Patient's Chart, lab work & pertinent test results  History of Anesthesia Complications (+) history of anesthetic complications (bladder issues in 2008 after lumpectomy)  Airway Mallampati: II  TM Distance: >3 FB Neck ROM: Full    Dental  (+) Dental Advisory Given, Missing   Pulmonary neg pulmonary ROS   Pulmonary exam normal breath sounds clear to auscultation       Cardiovascular negative cardio ROS  Rhythm:Regular Rate:Normal  TTE 12/16/2022: IMPRESSIONS    1. Left ventricular ejection fraction, by estimation, is 60 to 65%. The  left ventricle has normal function. The left ventricle has no regional  wall motion abnormalities. Left ventricular diastolic parameters were  normal.   2. Right ventricular systolic function is normal. The right ventricular  size is normal. There is normal pulmonary artery systolic pressure.   3. The mitral valve is normal in structure. No evidence of mitral valve  regurgitation. No evidence of mitral stenosis.   4. The aortic valve is normal in structure. Aortic valve regurgitation is  not visualized. Aortic valve sclerosis is present, with no evidence of  aortic valve stenosis.   5. The inferior vena cava is normal in size with greater than 50%  respiratory variability, suggesting right atrial pressure of 3 mmHg.     Neuro/Psych negative neurological ROS     GI/Hepatic Neg liver ROS,GERD  ,,  Endo/Other  negative endocrine ROS    Renal/GU negative Renal ROS     Musculoskeletal   Abdominal   Peds  Hematology  (+) Blood dyscrasia, anemia Lab Results      Component                Value               Date                      WBC                      4.8                 09/17/2023                HGB                      12.0                 09/17/2023                HCT                      36.1                09/17/2023                MCV                      88.7                09/17/2023                PLT                      163  09/17/2023              Anesthesia Other Findings   Reproductive/Obstetrics Breast cancer                              Anesthesia Physical Anesthesia Plan  ASA: 2  Anesthesia Plan: MAC   Post-op Pain Management: Tylenol  PO (pre-op)* and Minimal or no pain anticipated   Induction: Intravenous  PONV Risk Score and Plan: 2 and Propofol  infusion, TIVA, Midazolam  and Treatment may vary due to age or medical condition  Airway Management Planned: Simple Face Mask and Natural Airway  Additional Equipment:   Intra-op Plan:   Post-operative Plan:   Informed Consent: I have reviewed the patients History and Physical, chart, labs and discussed the procedure including the risks, benefits and alternatives for the proposed anesthesia with the patient or authorized representative who has indicated his/her understanding and acceptance.     Dental advisory given  Plan Discussed with: CRNA and Anesthesiologist  Anesthesia Plan Comments: (Discussed with patient risks of MAC including, but not limited to, minor pain or discomfort, hearing people in the room, and possible need for backup general anesthesia. Risks for general anesthesia also discussed including, but not limited to, sore throat, hoarse voice, chipped/damaged teeth, injury to vocal cords, nausea and vomiting, allergic reactions, lung infection, heart attack, stroke, and death. All questions answered. )         Anesthesia Quick Evaluation

## 2023-10-30 ENCOUNTER — Encounter (HOSPITAL_BASED_OUTPATIENT_CLINIC_OR_DEPARTMENT_OTHER): Payer: Self-pay | Admitting: Surgery

## 2023-10-30 ENCOUNTER — Ambulatory Visit (HOSPITAL_BASED_OUTPATIENT_CLINIC_OR_DEPARTMENT_OTHER): Payer: Self-pay | Admitting: Anesthesiology

## 2023-10-30 ENCOUNTER — Other Ambulatory Visit: Payer: Self-pay

## 2023-10-30 ENCOUNTER — Encounter (HOSPITAL_BASED_OUTPATIENT_CLINIC_OR_DEPARTMENT_OTHER): Admission: RE | Disposition: A | Payer: Self-pay | Source: Home / Self Care | Attending: Surgery

## 2023-10-30 ENCOUNTER — Ambulatory Visit (HOSPITAL_BASED_OUTPATIENT_CLINIC_OR_DEPARTMENT_OTHER)
Admission: RE | Admit: 2023-10-30 | Discharge: 2023-10-30 | Disposition: A | Discharge status: Home / Self Care | Attending: Surgery | Admitting: Surgery

## 2023-10-30 DIAGNOSIS — Z90722 Acquired absence of ovaries, bilateral: Secondary | ICD-10-CM | POA: Diagnosis not present

## 2023-10-30 DIAGNOSIS — Z90710 Acquired absence of both cervix and uterus: Secondary | ICD-10-CM | POA: Insufficient documentation

## 2023-10-30 DIAGNOSIS — Z1502 Genetic susceptibility to malignant neoplasm of ovary: Secondary | ICD-10-CM | POA: Insufficient documentation

## 2023-10-30 DIAGNOSIS — Z853 Personal history of malignant neoplasm of breast: Secondary | ICD-10-CM | POA: Diagnosis not present

## 2023-10-30 DIAGNOSIS — Z9013 Acquired absence of bilateral breasts and nipples: Secondary | ICD-10-CM | POA: Diagnosis not present

## 2023-10-30 DIAGNOSIS — K219 Gastro-esophageal reflux disease without esophagitis: Secondary | ICD-10-CM | POA: Insufficient documentation

## 2023-10-30 DIAGNOSIS — Z01818 Encounter for other preprocedural examination: Secondary | ICD-10-CM

## 2023-10-30 DIAGNOSIS — Z452 Encounter for adjustment and management of vascular access device: Secondary | ICD-10-CM | POA: Diagnosis present

## 2023-10-30 DIAGNOSIS — Z9221 Personal history of antineoplastic chemotherapy: Secondary | ICD-10-CM | POA: Diagnosis not present

## 2023-10-30 DIAGNOSIS — Z1501 Genetic susceptibility to malignant neoplasm of breast: Secondary | ICD-10-CM | POA: Insufficient documentation

## 2023-10-30 HISTORY — PX: PORT-A-CATH REMOVAL: SHX5289

## 2023-10-30 HISTORY — DX: Gastro-esophageal reflux disease without esophagitis: K21.9

## 2023-10-30 SURGERY — REMOVAL PORT-A-CATH
Anesthesia: Monitor Anesthesia Care | Site: Chest | Laterality: Right

## 2023-10-30 MED ORDER — PROPOFOL 500 MG/50ML IV EMUL
INTRAVENOUS | Status: DC | PRN
Start: 2023-10-30 — End: 2023-10-30
  Administered 2023-10-30: 50 ug/kg/min via INTRAVENOUS

## 2023-10-30 MED ORDER — CHLORHEXIDINE GLUCONATE CLOTH 2 % EX PADS: 6.0000 | MEDICATED_PAD | Freq: Once | CUTANEOUS | Status: DC

## 2023-10-30 MED ORDER — PHENYLEPHRINE HCL (PRESSORS) 10 MG/ML IV SOLN
INTRAVENOUS | Status: DC | PRN
  Administered 2023-10-30: 80 ug via INTRAVENOUS

## 2023-10-30 MED ORDER — MIDAZOLAM HCL 5 MG/5ML IJ SOLN
INTRAMUSCULAR | Status: DC | PRN
  Administered 2023-10-30: .5 mg via INTRAVENOUS
  Administered 2023-10-30: 1 mg via INTRAVENOUS
  Administered 2023-10-30: .5 mg via INTRAVENOUS

## 2023-10-30 MED ORDER — MIDAZOLAM HCL 2 MG/2ML IJ SOLN
INTRAMUSCULAR | Status: AC
  Filled 2023-10-30: qty 2

## 2023-10-30 MED ORDER — LIDOCAINE HCL (CARDIAC) PF 100 MG/5ML IV SOSY
PREFILLED_SYRINGE | INTRAVENOUS | Status: DC | PRN
  Administered 2023-10-30: 60 mg via INTRAVENOUS

## 2023-10-30 MED ORDER — CLINDAMYCIN PHOSPHATE 900 MG/50ML IV SOLN
900.0000 mg | INTRAVENOUS | Status: AC
Start: 2023-10-30 — End: 2023-10-30
  Administered 2023-10-30: 900 mg via INTRAVENOUS

## 2023-10-30 MED ORDER — ACETAMINOPHEN 500 MG PO TABS
1000.0000 mg | ORAL_TABLET | Freq: Once | ORAL | Status: AC
Start: 2023-10-30 — End: 2023-10-30
  Administered 2023-10-30: 1000 mg via ORAL

## 2023-10-30 MED ORDER — OXYCODONE HCL 5 MG/5ML PO SOLN: 5.0000 mg | Freq: Once | ORAL | Status: DC | PRN

## 2023-10-30 MED ORDER — ONDANSETRON HCL 4 MG/2ML IJ SOLN
INTRAMUSCULAR | Status: AC
  Filled 2023-10-30: qty 2

## 2023-10-30 MED ORDER — LIDOCAINE 2% (20 MG/ML) 5 ML SYRINGE
INTRAMUSCULAR | Status: AC
  Filled 2023-10-30: qty 5

## 2023-10-30 MED ORDER — OXYCODONE HCL 5 MG PO TABS: 5.0000 mg | ORAL_TABLET | Freq: Once | ORAL | Status: DC | PRN

## 2023-10-30 MED ORDER — PHENYLEPHRINE 80 MCG/ML (10ML) SYRINGE FOR IV PUSH (FOR BLOOD PRESSURE SUPPORT)
PREFILLED_SYRINGE | INTRAVENOUS | Status: AC
  Filled 2023-10-30: qty 10

## 2023-10-30 MED ORDER — PROPOFOL 10 MG/ML IV BOLUS
INTRAVENOUS | Status: DC | PRN
  Administered 2023-10-30: 40 mg via INTRAVENOUS

## 2023-10-30 MED ORDER — AMISULPRIDE (ANTIEMETIC) 5 MG/2ML IV SOLN: 10.0000 mg | Freq: Once | INTRAVENOUS | Status: DC | PRN

## 2023-10-30 MED ORDER — ONDANSETRON HCL 4 MG/2ML IJ SOLN
INTRAMUSCULAR | Status: DC | PRN
  Administered 2023-10-30: 4 mg via INTRAVENOUS

## 2023-10-30 MED ORDER — FENTANYL CITRATE (PF) 100 MCG/2ML IJ SOLN
INTRAMUSCULAR | Status: AC
  Filled 2023-10-30: qty 2

## 2023-10-30 MED ORDER — BUPIVACAINE-EPINEPHRINE 0.25% -1:200000 IJ SOLN
INTRAMUSCULAR | Status: DC | PRN
  Administered 2023-10-30: 17 mL

## 2023-10-30 MED ORDER — CLINDAMYCIN PHOSPHATE 900 MG/50ML IV SOLN
INTRAVENOUS | Status: AC
  Filled 2023-10-30: qty 50

## 2023-10-30 MED ORDER — DEXAMETHASONE SODIUM PHOSPHATE 4 MG/ML IJ SOLN
INTRAMUSCULAR | Status: DC | PRN
  Administered 2023-10-30: 5 mg via INTRAVENOUS

## 2023-10-30 MED ORDER — FENTANYL CITRATE (PF) 100 MCG/2ML IJ SOLN: 25.0000 ug | INTRAMUSCULAR | Status: DC | PRN

## 2023-10-30 MED ORDER — FENTANYL CITRATE (PF) 100 MCG/2ML IJ SOLN
INTRAMUSCULAR | Status: DC | PRN
  Administered 2023-10-30 (×2): 25 ug via INTRAVENOUS

## 2023-10-30 MED ORDER — ACETAMINOPHEN 500 MG PO TABS
ORAL_TABLET | ORAL | Status: AC
  Filled 2023-10-30: qty 2

## 2023-10-30 MED ORDER — LACTATED RINGERS IV SOLN: INTRAVENOUS | Status: DC

## 2023-10-30 MED ORDER — PROPOFOL 500 MG/50ML IV EMUL
INTRAVENOUS | Status: AC
  Filled 2023-10-30: qty 100

## 2023-10-30 MED ORDER — 0.9 % SODIUM CHLORIDE (POUR BTL) OPTIME
TOPICAL | Status: DC | PRN
  Administered 2023-10-30: 100 mL

## 2023-10-30 SURGICAL SUPPLY — 26 items
BENZOIN TINCTURE PRP APPL 2/3 (GAUZE/BANDAGES/DRESSINGS) IMPLANT
BLADE SURG 15 STRL LF DISP TIS (BLADE) ×1 IMPLANT
CHLORAPREP W/TINT 26 (MISCELLANEOUS) ×1 IMPLANT
COVER BACK TABLE 60X90IN (DRAPES) ×1 IMPLANT
COVER MAYO STAND STRL (DRAPES) ×1 IMPLANT
DERMABOND ADVANCED .7 DNX12 (GAUZE/BANDAGES/DRESSINGS) ×1 IMPLANT
DRAPE LAPAROTOMY 100X72 PEDS (DRAPES) ×1 IMPLANT
DRAPE UTILITY XL STRL (DRAPES) ×1 IMPLANT
ELECTRODE REM PT RTRN 9FT ADLT (ELECTROSURGICAL) ×1 IMPLANT
GLOVE BIOGEL PI IND STRL 8 (GLOVE) ×1 IMPLANT
GLOVE ECLIPSE 8.0 STRL XLNG CF (GLOVE) ×1 IMPLANT
GOWN STRL REUS W/ TWL LRG LVL3 (GOWN DISPOSABLE) ×2 IMPLANT
GOWN STRL REUS W/ TWL XL LVL3 (GOWN DISPOSABLE) ×1 IMPLANT
NDL HYPO 25X1 1.5 SAFETY (NEEDLE) ×1 IMPLANT
NEEDLE HYPO 25X1 1.5 SAFETY (NEEDLE) ×1 IMPLANT
NS IRRIG 1000ML POUR BTL (IV SOLUTION) ×1 IMPLANT
PACK BASIN DAY SURGERY FS (CUSTOM PROCEDURE TRAY) ×1 IMPLANT
PENCIL SMOKE EVACUATOR (MISCELLANEOUS) IMPLANT
SLEEVE SCD COMPRESS KNEE MED (STOCKING) IMPLANT
SPIKE FLUID TRANSFER (MISCELLANEOUS) ×1 IMPLANT
SPONGE T-LAP 4X18 ~~LOC~~+RFID (SPONGE) ×1 IMPLANT
STRIP CLOSURE SKIN 1/2X4 (GAUZE/BANDAGES/DRESSINGS) IMPLANT
SUT MON AB 4-0 PC3 18 (SUTURE) ×1 IMPLANT
SUT VICRYL 3-0 CR8 SH (SUTURE) ×1 IMPLANT
SYR CONTROL 10ML LL (SYRINGE) ×1 IMPLANT
TOWEL GREEN STERILE FF (TOWEL DISPOSABLE) ×1 IMPLANT

## 2023-10-30 NOTE — Transfer of Care (Signed)
 Immediate Anesthesia Transfer of Care Note  Patient: Melissa Rodriguez  Procedure(s) Performed: REMOVAL PORT-A-CATH (Right: Chest)  Patient Location: PACU  Anesthesia Type:MAC  Level of Consciousness: awake  Airway & Oxygen Therapy: Patient Spontanous Breathing  Post-op Assessment: Report given to RN  Post vital signs: Reviewed  Last Vitals:  Vitals Value Taken Time  BP 91/41 10/30/23 09:46  Temp    Pulse 84 10/30/23 09:47  Resp    SpO2 100 % 10/30/23 09:47  Vitals shown include unfiled device data.  Last Pain:  Vitals:   10/30/23 0752  TempSrc: Temporal  PainSc: 0-No pain         Complications: No notable events documented.

## 2023-10-30 NOTE — Op Note (Signed)
Preop diagnosis: Indwelling port a catheter for chemotherapy  Postop diagnosis: Same  Procedure: Removal of port a catheter  Surgeon: Kenzel Ruesch M.D.  Anesthesia: MAC with local  EBL: Minimal  Specimen none  Drains: None  Indications for procedure: The patient presents for removal of port a catheter after completing chemotherapy. The patient no longer requires central venous access. Risks of bleeding, infection, catheter fragmentation, embolization, arrhythmias and damage to arteries, veins and nerves and possibly other mediastinal structures discussed. The patient agrees to proceed.  Description of procedure: The patient was seen in the holding area. Questions were answered. The patient agreed to proceed. The patient was taken to the operating room. The patient was placed supine. Anesthesia was initiated. The skin on the upper chest was prepped and draped in a sterile fashion. Timeout was done. The patient received preoperative antibiotics. Incision was made through the old port site and the hub of the Port-A-Cath was seen. The sutures were cut to release the port from the chest wall. The catheter was removed in its entirety without difficulty. The tract was closed with 3-0 Vicryl. 4 Monocryl was used to close the skin. All final counts were correct. The patient was taken to recovery in satisfactory condition.  

## 2023-10-30 NOTE — Anesthesia Procedure Notes (Signed)
 Procedure Name: MAC Date/Time: 10/30/2023 9:04 AM  Performed by: Pam Macario BROCKS, CRNAPre-anesthesia Checklist: Patient being monitored, Suction available, Emergency Drugs available, Patient identified and Timeout performed Patient Re-evaluated:Patient Re-evaluated prior to induction Oxygen Delivery Method: Simple face mask Preoxygenation: Pre-oxygenation with 100% oxygen Placement Confirmation: CO2 detector and positive ETCO2

## 2023-10-30 NOTE — Anesthesia Postprocedure Evaluation (Signed)
 Anesthesia Post Note  Patient: Melissa Rodriguez  Procedure(s) Performed: REMOVAL PORT-A-CATH (Right: Chest)     Patient location during evaluation: PACU Anesthesia Type: MAC Level of consciousness: awake Pain management: pain level controlled Vital Signs Assessment: post-procedure vital signs reviewed and stable Respiratory status: spontaneous breathing, nonlabored ventilation and respiratory function stable Cardiovascular status: stable and blood pressure returned to baseline Postop Assessment: no apparent nausea or vomiting Anesthetic complications: no   No notable events documented.  Last Vitals:  Vitals:   10/30/23 1015 10/30/23 1030  BP: (!) 116/51 130/65  Pulse: 78 90  Resp: 16 16  Temp:  (!) 36.3 C  SpO2: 97% 98%    Last Pain:  Vitals:   10/30/23 1030  TempSrc:   PainSc: 0-No pain                 Delon Aisha Arch

## 2023-10-30 NOTE — Interval H&P Note (Signed)
 History and Physical Interval Note:  10/30/2023 8:31 AM  Melissa Rodriguez  has presented today for surgery, with the diagnosis of PORT IN PLACE.  The various methods of treatment have been discussed with the patient and family. After consideration of risks, benefits and other options for treatment, the patient has consented to  Procedure(s): REMOVAL PORT-A-CATH (N/A) as a surgical intervention.  The patient's history has been reviewed, patient examined, no change in status, stable for surgery.  I have reviewed the patient's chart and labs.  Questions were answered to the patient's satisfaction.     Romy Ipock A Cloud Graham

## 2023-10-30 NOTE — H&P (Signed)
 History of Present Illness: Melissa Rodriguez is a 58 y.o. female who is seen today for follow-up after treatment of breast cancer and chemotherapy. She is done with her port and comes in to have her port removed. She has no complaints..    Review of Systems: A complete review of systems was obtained from the patient. I have reviewed this information and discussed as appropriate with the patient. See HPI as well for other ROS.    Medical History: Past Medical History:  Diagnosis Date  Anemia  Breast mass 2024  Had different breast cancer in 2008 and BRC2 breast cancer 2024  GERD (gastroesophageal reflux disease)  History of anesthesia reaction  Urinary retention requiring foley cath after partial mastectomy  History of cancer  Osteoporosis 2024   Patient Active Problem List  Diagnosis  Malignant neoplasm of female breast (CMS/HHS-HCC)  Pelvic mass  BRCA2 positive  History of urinary retention  History of nephrolithiasis   Past Surgical History:  Procedure Laterality Date  MASTECTOMY PARTIAL / LUMPECTOMY 2008  LAPAROSCOPIC EXCISION/FULGURATION LESIONS OVARY/PELVIS/PERITONEUM N/A 04/08/2023  Procedure: LAPAROSCOPY, SURGICAL; WITH FULGURATION OR EXCISION OF LESIONS OF THE OVARY, PELVIC VISCERA, OR PERITONEAL SURFACE BY ANY METHOD; Surgeon: Mancil Barter, MD; Location: [facility] OR; Service: Gynecology; Laterality: N/A;  LAPAROSCOPIC HYSTERECTOMY TOTAL W/REMOVAL TUBES &/OR OVARIES Bilateral 04/08/2023  Procedure: LAPAROSCOPY, SURGICAL; WITH TOTAL HYSTERECTOMY, FOR UTERUS 250 G OR LESS; WITH REMOVAL OF TUBE(S) AND/OR OVARY(S); Surgeon: Mancil Barter, MD; Location: [facility] OR; Service: Gynecology; Laterality: Bilateral;  PELVIC EXAMINATION UNDER ANESTHESIA N/A 04/08/2023  Procedure: PELVIC EXAMINATION UNDER ANESTHESIA (OTHER THAN LOCAL); Surgeon: Mancil Barter, MD; Location: [facility] OR; Service: Gynecology; Laterality: N/A;  LAPAROSCOPY W/RETROPERITONEAL LYMPH NODE SAMPLING  N/A 04/08/2023  Procedure: LAPAROSCOPY, SURGICAL; WITH RETROPERITONEAL LYMPH NODE SAMPLING (BIOPSY), SINGLE OR MULTIPLE; Surgeon: Mancil Barter, MD; Location: [facility] OR; Service: Gynecology; Laterality: N/A;  BREAST BIOPSY 2024  BREAST SURGERY Double Mastectomy  2024  portacath placement Right  ACW    Allergies  Allergen Reactions  Diphenhydramine  Hcl Hallucination and Anxiety  Jittery and Hallucinations  Made her feel like she was crawling the walls.  Hydrocodone  Shortness Of Breath  Felt like she could not breathe-February 2020  Ciprofloxacin Hives  Penicillins Hives and Rash  Any medicine related to PCN  Did it involve swelling of the face/tongue/throat, SOB, or low BP? No Did it involve sudden or severe rash/hives, skin peeling, or any reaction on the inside of your mouth or nose? No Did you need to seek medical attention at a hospital or doctor's office? No When did it last happen?  If all above answers are "NO", may proceed with cephalosporin use.  Antihistamines - Piperidine Other (See Comments)  Docetaxel  Other (See Comments)  Tingling of lips, teeth hurting, hot tongue   Current Outpatient Medications on File Prior to Visit  Medication Sig Dispense Refill  calcium carbonate (TUMS) 200 mg calcium (500 mg) chewable tablet Take 1 tablet by mouth once daily  cholecalciferol (VITAMIN D3) 1000 unit capsule Take 1,000 Units by mouth once daily  multivitamin tablet Take 1 tablet by mouth once daily   No current facility-administered medications on file prior to visit.   Family History  Problem Relation Age of Onset  High blood pressure (Hypertension) Mother  Hyperlipidemia (Elevated cholesterol) Mother  High blood pressure (Hypertension) Father  Hyperlipidemia (Elevated cholesterol) Father  Coronary Artery Disease (Blocked arteries around heart) Father  Anesthesia problems Neg Hx    Social History   Tobacco Use  Smoking Status Never  Smokeless Tobacco Never     Social History   Socioeconomic History  Marital status: Married  Tobacco Use  Smoking status: Never  Smokeless tobacco: Never  Vaping Use  Vaping status: Never Used  Substance and Sexual Activity  Alcohol use: Never  Drug use: Never  Sexual activity: Yes  Partners: Male  Birth control/protection: Post-menopausal  Comment: Husband only  Other Topics Concern  Would you please tell us  about the people who live in your home, your pets, or anything else important to your social life? Yes  Comment: Two sons still live at home, no pets   Social Drivers of Health   Financial Resource Strain: Patient Declined (10/16/2022)  Received from [facility]  Overall Financial Resource Strain (CARDIA)  Difficulty of Paying Living Expenses: Patient declined  Recent Concern: Financial Resource Strain - High Risk (10/03/2022)  Received from [facility]  Overall Financial Resource Strain (CARDIA)  Difficulty of Paying Living Expenses: Hard  Food Insecurity: No Food Insecurity (11/27/2022)  Received from [facility]  Hunger Vital Sign  Within the past 12 months, you worried that your food would run out before you got the money to buy more.: Never true  Within the past 12 months, the food you bought just didn't last and you didn't have money to get more.: Never true  Transportation Needs: No Transportation Needs (11/27/2022)  Received from [facility] - Transportation  Lack of Transportation (Medical): No  Lack of Transportation (Non-Medical): No  Physical Activity: Insufficiently Active (10/16/2022)  Received from [facility]  Exercise Vital Sign  On average, how many days per week do you engage in moderate to strenuous exercise (like a brisk walk)?: 1 day  On average, how many minutes do you engage in exercise at this level?: 20 min  Stress: No Stress Concern Present (10/16/2022)  Received from [facility] - Occupational Stress  Questionnaire  Feeling of Stress : Not at all  Social Connections: Unknown (10/16/2022)  Received from [facility]  Social Connection and Isolation Panel  In a typical week, how many times do you talk on the phone with family, friends, or neighbors?: Patient declined  How often do you get together with friends or relatives?: Patient declined  How often do you attend church or religious services?: More than 4 times per year  Do you belong to any clubs or organizations such as church groups, unions, fraternal or athletic groups, or school groups?: Yes  How often do you attend meetings of the clubs or organizations you belong to?: More than 4 times per year  Are you married, widowed, divorced, separated, never married, or living with a partner?: Married  Housing Stability: Unknown (03/27/2023)  Housing Stability Vital Sign  Homeless in the Last Year: No   Objective:   Vitals:  10/16/23 1045  PainSc: 0-No pain   There is no height or weight on file to calculate BMI.  Physical Exam Exam conducted with a chaperone present.  Cardiovascular:  Rate and Rhythm: Normal rate.  Pulmonary:  Effort: Pulmonary effort is normal.  Chest:   Comments: Port under right clavicle noted. Both breasts are absent due to surgical removal. No masses. Neurological:  General: No focal deficit present.  Mental Status: She is alert.     Assessment and Plan:   Diagnoses and all orders for this visit:  Port-A-Cath in place    Plan for port removal  History of breast cancer  stable overall status post left simple mastectomy with risk-reducing right mastectomy August 2024  BRCA2 positive status post oophorectomy and hysterectomy  Plan port removal.The procedure has been discussed with the patient. Alternatives to surgery have been discussed with the patient. Risks of surgery include bleeding, Infection, Seroma formation, death, and the need for further surgery. The patient understands and wishes to  proceed.   No follow-ups on file.  DEBBY CURTISTINE SHIPPER, MD

## 2023-10-30 NOTE — Discharge Instructions (Addendum)
 #######################################################  GENERAL SURGERY: POST OP INSTRUCTIONS  ######################################################################  EAT Gradually transition to a high fiber diet with a fiber supplement over the next few weeks after discharge.  Start with a pureed / full liquid diet (see below)  WALK Walk an hour a day.  Control your pain to do that.    CONTROL PAIN Control pain so that you can walk, sleep, tolerate sneezing/coughing, go up/down stairs.  HAVE A BOWEL MOVEMENT DAILY Keep your bowels regular to avoid problems.  OK to try a laxative to override constipation.  OK to use an antidairrheal to slow down diarrhea.  Call if not better after 2 tries  CALL IF YOU HAVE PROBLEMS/CONCERNS Call if you are still struggling despite following these instructions. Call if you have concerns not answered by these instructions  ######################################################################    DIET: Follow a light bland diet & liquids the first 24 hours after arrival home, such as soup, liquids, starches, etc.  Be sure to drink plenty of fluids.  Quickly advance to a usual solid diet within a few days.  Avoid fast food or heavy meals as your are more likely to get nauseated or have irregular bowels.  A low-fat, high-fiber diet for the rest of your life is ideal.    Take your usually prescribed home medications unless otherwise directed. Blood thinners:  You can restart any strong blood thinners after the second postoperative day  for example: COUMADIN (warfarin), XERELTO (rivaroxaban), ELIQUIS (apixaban), PLAVIX (clopidigrel), BRILINTA (ticagrelor), EFFIENT (prasugrel), PRADAXA (dabigatran), etc  Continue aspirin before & after surgery..     Some oozing/bleeding the first 1-2 weeks is common but should taper down & be small volume.    If you are passing many large clots or having uncontrolling bleeding, call your surgeon  PAIN CONTROL: Pain  is best controlled by a usual combination of three different methods TOGETHER: Ice/Heat Over the counter pain medication Prescription pain medication Most patients will experience some swelling and bruising around the incisions.  Ice packs or heating pads (30-60 minutes up to 6 times a day) will help. Use ice for the first few days to help decrease swelling and bruising, then switch to heat to help relax tight/sore spots and speed recovery.  Some people prefer to use ice alone, heat alone, alternating between ice & heat.  Experiment to what works for you.  Swelling and bruising can take several weeks to resolve.   It is helpful to take an over-the-counter pain medication regularly for the first few weeks.  Choose one of the following that works best for you: Naproxen (Aleve, etc)  Two 220mg  tabs twice a day Ibuprofen  (Advil , etc) Three 200mg  tabs four times a day (every meal & bedtime) Acetaminophen  (Tylenol , etc) 500-650mg  four times a day (every meal & bedtime) A  prescription for pain medication (such as oxycodone , hydrocodone , etc) should be given to you upon discharge.  Take your pain medication as prescribed.  If you are having problems/concerns with the prescription medicine (does not control pain, nausea, vomiting, rash, itching, etc), please call us  [phone] to see if we need to switch you to a different pain medicine that will work better for you and/or control your side effect better. If you need a refill on your pain medication, please contact your pharmacy.  They will contact our office to request authorization. Prescriptions will not be filled after 5 pm or on week-ends.  Avoid getting constipated.  Between the surgery and the pain medications, it is  common to experience some constipation.  Increasing fluid intake and taking a fiber supplement (such as Metamucil, Citrucel, FiberCon, MiraLax, etc) 1-2 times a day regularly will usually help prevent this problem from occurring.  A mild  laxative (prune juice, Milk of Magnesia, MiraLax, etc) should be taken according to package directions if there are no bowel movements after 48 hours.   Watch out for diarrhea.  If you have many loose bowel movements, simplify your diet to bland foods & liquids for a few days.  Stop any stool softeners and decrease your fiber supplement.  Switching to mild anti-diarrheal medications (Loperamide/Imodium, Kayopectate, Pepto Bismol) can help.  If this worsens or does not improve, please call us .  Wash / shower every day.  You may shower over the dressings as they are waterproof.  Continue to shower over incision(s) after the dressing is off. Remove your waterproof bandages 5 days after surgery.  You may leave the incision open to air.  You may have skin tapes (Steri Strips) covering the incision(s).  Leave them on until one week, then remove.  You may replace a dressing/Band-Aid to cover the incision for comfort if you wish.   ACTIVITIES as tolerated:   You may resume regular (light) daily activities beginning the next day--such as daily self-care, walking, climbing stairs--gradually increasing activities as tolerated.  If you can walk 30 minutes without difficulty, it is safe to try more intense activity such as jogging, treadmill, bicycling, low-impact aerobics, swimming, etc. Save the most intensive and strenuous activity for last such as sit-ups, heavy lifting, contact sports, etc  Refrain from any heavy lifting or straining until you are off narcotics for pain control.   DO NOT PUSH THROUGH PAIN.  Let pain be your guide: If it hurts to do something, don't do it.  Pain is your body warning you to avoid that activity for another week until the pain goes down. You may drive when you are no longer taking prescription pain medication, you can comfortably wear a seatbelt, and you can safely maneuver your car and apply brakes. You may have sexual intercourse when it is comfortable.   FOLLOW UP in our  office Please call [facility] at [phone] to set up an appointment to see your surgeon in the office for a follow-up appointment approximately 2-3 weeks after your surgery. Make sure that you call for this appointment the day you arrive home to insure a convenient appointment time.  9. IF YOU HAVE DISABILITY OR FAMILY LEAVE FORMS, BRING THEM TO THE OFFICE FOR PROCESSING.  DO NOT GIVE THEM TO YOUR DOCTOR.   WHEN TO CALL US  [phone]: Poor pain control Reactions / problems with new medications (rash/itching, nausea, etc)  Fever over 101.5 F (38.5 C) Worsening swelling or bruising Continued bleeding from incision. Increased pain, redness, or drainage from the incision Difficulty breathing / swallowing   The clinic staff is available to answer your questions during regular business hours (8:30am-5pm).  Please don't hesitate to call and ask to speak to one of our nurses for clinical concerns.   If you have a medical emergency, go to the nearest emergency room or call 911.  A surgeon from [facility] is always on call at the [facility], [place] [address], [place], [place]  [address] ? MAIN: [phone] ? TOLL FREE: [phone] ?  FAX [phone] [email or web address]  #######################################################    No Tylenol  until 2:00 pm today.

## 2023-10-31 ENCOUNTER — Encounter (HOSPITAL_BASED_OUTPATIENT_CLINIC_OR_DEPARTMENT_OTHER): Payer: Self-pay | Admitting: Surgery

## 2023-11-03 ENCOUNTER — Encounter: Payer: Self-pay | Admitting: Oncology

## 2023-11-04 ENCOUNTER — Encounter: Payer: Self-pay | Admitting: Oncology

## 2024-01-09 ENCOUNTER — Encounter: Payer: Self-pay | Admitting: Oncology

## 2024-01-13 ENCOUNTER — Encounter: Payer: Self-pay | Admitting: Oncology

## 2024-01-13 ENCOUNTER — Inpatient Hospital Stay: Attending: Oncology | Admitting: Oncology

## 2024-01-13 VITALS — BP 117/56 | HR 87 | Temp 97.2°F | Resp 18 | Ht 63.0 in | Wt 93.4 lb

## 2024-01-13 DIAGNOSIS — Z1731 Human epidermal growth factor receptor 2 positive status: Secondary | ICD-10-CM | POA: Diagnosis not present

## 2024-01-13 DIAGNOSIS — C50412 Malignant neoplasm of upper-outer quadrant of left female breast: Secondary | ICD-10-CM | POA: Insufficient documentation

## 2024-01-13 DIAGNOSIS — Z809 Family history of malignant neoplasm, unspecified: Secondary | ICD-10-CM | POA: Insufficient documentation

## 2024-01-13 DIAGNOSIS — Z1509 Genetic susceptibility to other malignant neoplasm: Secondary | ICD-10-CM | POA: Insufficient documentation

## 2024-01-13 DIAGNOSIS — Z170 Estrogen receptor positive status [ER+]: Secondary | ICD-10-CM | POA: Insufficient documentation

## 2024-01-13 DIAGNOSIS — M818 Other osteoporosis without current pathological fracture: Secondary | ICD-10-CM | POA: Diagnosis not present

## 2024-01-13 DIAGNOSIS — M810 Age-related osteoporosis without current pathological fracture: Secondary | ICD-10-CM | POA: Diagnosis not present

## 2024-01-13 DIAGNOSIS — Z1501 Genetic susceptibility to malignant neoplasm of breast: Secondary | ICD-10-CM | POA: Insufficient documentation

## 2024-01-13 DIAGNOSIS — D3912 Neoplasm of uncertain behavior of left ovary: Secondary | ICD-10-CM | POA: Insufficient documentation

## 2024-01-13 DIAGNOSIS — Z803 Family history of malignant neoplasm of breast: Secondary | ICD-10-CM | POA: Diagnosis not present

## 2024-01-13 DIAGNOSIS — Z923 Personal history of irradiation: Secondary | ICD-10-CM | POA: Diagnosis not present

## 2024-01-13 DIAGNOSIS — Z1721 Progesterone receptor positive status: Secondary | ICD-10-CM | POA: Diagnosis not present

## 2024-01-13 DIAGNOSIS — Z9013 Acquired absence of bilateral breasts and nipples: Secondary | ICD-10-CM | POA: Insufficient documentation

## 2024-01-13 DIAGNOSIS — Z90722 Acquired absence of ovaries, bilateral: Secondary | ICD-10-CM | POA: Insufficient documentation

## 2024-01-13 DIAGNOSIS — Z90710 Acquired absence of both cervix and uterus: Secondary | ICD-10-CM | POA: Insufficient documentation

## 2024-01-13 MED ORDER — TAMOXIFEN CITRATE 10 MG PO TABS: 10.0000 mg | ORAL_TABLET | Freq: Every day | ORAL | 3 refills | Status: AC

## 2024-01-13 NOTE — Progress Notes (Signed)
 Patient doing okay. Still having a bit of pain where she received radiation. Otherwise, no new or acute concerns at this time.

## 2024-01-13 NOTE — Progress Notes (Signed)
 "    Hematology/Oncology Consult note [facility]  Telephone:([phone] [phone]  Patient Care Team: Alphonsa Glendia LABOR, MD as PCP - General (Family Medicine) Celestia Joesph SQUIBB, RN as Oncology Nurse Navigator (Medical Oncology) Melanee Annah BROCKS, MD as Consulting Physician (Oncology) Maurie Rayfield BIRCH, RN as Oncology Nurse Navigator Lenn Aran, MD as Consulting Physician (Radiation Oncology)   Name of the patient: Melissa Rodriguez  [id number]  06-10-1965   Date of visit: 01/13/2024  Diagnosis- pathologic prognostic stage Ib invasive mammary carcinoma of the left breast T2 N1 M0 ER/PR positive HER2 positive   Chief complaint/ Reason for visit-routine follow-up of breast cancer  Heme/Onc history: Patient is a 58 year old female with a prior history of lumpectomy in 2008 which showed a 7 mm stage I ER/PR positive HER2 negative left breast cancer.  She underwent lumpectomy and adjuvant radiation therapy but did not go through endocrine therapy.   She self palpated a left breast lump led to a diagnostic mammogram in July 2024.  Diagnostic mammogram showed 1.9 cm irregular hypoechoic mass 1 o'clock position 7 cm from the breast grade 3 ER 99% positive, PR 85% positive and HER2 equivocal by IHC and FISH positive.  Ki-67 35% there were calcifications noted in the right breast as well which were biopsy-proven benign.     She underwent genetic testing and was found to have BRCA2 mutation   Patient was seen by Dr. Rogers and he recommended upfront surgery and consideration for adjuvant chemotherapy.  Patient underwent bilateral mastectomy on 08/15/2022.  No evidence of malignancy in the right breast.  There were 2 distinct masses noted in the left breast with dominant mass measured 2.4 x 2.3 x 1.6 cm grade 3 with negative margins.  There was another 1 cm mass where there was uptake of radiotracer dye with positive posterior lateral anterior margin.  This was believed to  be the sentinel lymph node although on pathology it states that there were no lymph nodes identified likely due to prior lumpectomy. mpT2.  She was staged as T2 N1 triple positive disease.  Patient completed 6 cycles of adjuvant TCHP chemotherapy and is currently on maintenance Herceptin  only.  She could not tolerate dual Herceptin  and Perjeta .  She completed adjuvant radiation   Patient completed 6 cycles of adjuvant TCHP chemotherapy and 1 year of maintenance Herceptin  .  Patient had significant diarrhea with Perjeta  and therefore did not receive adjuvant Perjeta ..  She has not started taking endocrine therapy yet  Patient found to have 7.9 cm lobulated left ovarian mass which was resected at [facility] in March 2025.  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  No pathologic diagnosis was noted in the uterus contralateral ovary and fallopian tube.  History of Present Illness    Interval history- Melissa Rodriguez is a 58 year old female with invasive breast cancer and osteoporosis who presents for oncology follow-up to discuss adjuvant endocrine therapy options.  Options discussed include aromatase inhibitors and tamoxifen . She expresses concern regarding the potential impact of aromatase inhibitors on her osteoporosis and prefers to avoid them. She is interested in initiating tamoxifen  at a lower dose of 10 mg daily, with the possibility of titrating to 20 mg if tolerated, due to her sensitivity to medications. She is aware that the standard dose is 20 mg. She denies new symptoms or acute complaints at this visit.  She prefers to defer initiation of pharmacologic therapy for osteoporosis at  this time due to her sensitivity to medications.      ECOG PS- 1 Pain scale- 0   Review of systems- Review of Systems  Constitutional:  Negative for chills, fever, malaise/fatigue and weight loss.  HENT:  Negative for congestion, ear discharge and  nosebleeds.   Eyes:  Negative for blurred vision.  Respiratory:  Negative for cough, hemoptysis, sputum production, shortness of breath and wheezing.   Cardiovascular:  Negative for chest pain, palpitations, orthopnea and claudication.  Gastrointestinal:  Negative for abdominal pain, blood in stool, constipation, diarrhea, heartburn, melena, nausea and vomiting.  Genitourinary:  Negative for dysuria, flank pain, frequency, hematuria and urgency.  Musculoskeletal:  Negative for back pain, joint pain and myalgias.  Skin:  Negative for rash.  Neurological:  Negative for dizziness, tingling, focal weakness, seizures, weakness and headaches.  Endo/Heme/Allergies:  Does not bruise/bleed easily.  Psychiatric/Behavioral:  Negative for depression and suicidal ideas. The patient does not have insomnia.       Allergies[1]   Past Medical History:  Diagnosis Date   Adenocarcinoma of breast (HCC) 05/20/2011   Stage I (T1b N0), grade 1, well-differentiated adenocarcinoma of the medial lower quadrant of the left breast. It was a tubular carcinoma, status post lumpectomy on 03/13/2006, ER positive 79%, PR positive 92%, HER2/neu negative, Ki-67 marker low at 18%. Her cancer was 7 mm in size, and she had 4 sentinel nodes all negative along with her lumpectomy. She was treated with radiation therapy postoper   Complication of anesthesia    pt states her bladder did not wake up after one surgery. Went to ER, had a urinary catheter for 1 week   GERD (gastroesophageal reflux disease)    History of kidney stones    Iron deficiency    Iron deficiency anemia 05/20/2011   Secondary to GU bleeding.  Good absorber of PO iron   Personal history of radiation therapy      Past Surgical History:  Procedure Laterality Date   BREAST BIOPSY Left 07/16/2022   US  LT BREAST BX W LOC DEV 1ST LESION IMG BX SPEC US  GUIDE 07/16/2022 Lennon Nest, MD [facility]   BREAST BIOPSY Right 07/26/2022   MM RT BREAST BX W LOC DEV  1ST LESION IMAGE BX SPEC STEREO GUIDE 07/26/2022 [facility]   BREAST LUMPECTOMY Left    EXTRACORPOREAL SHOCK WAVE LITHOTRIPSY Left 02/26/2018   Procedure: EXTRACORPOREAL SHOCK WAVE LITHOTRIPSY (ESWL);  Surgeon: Sherrilee Belvie CROME, MD;  Location: [facility];  Service: Urology;  Laterality: Left;   LUMPECTOM LEFT BREAST  02/2006   LYMPH NODE BIOPSY LEFT  04/2006   MASTECTOMY W/ SENTINEL NODE BIOPSY Left 08/15/2022   Procedure: LEFT SIMPLE MASTECTOMY, LEFT SENTINEL LYMPH NODE MAPPING;  Surgeon: Vanderbilt Ned, MD;  Location: [facility];  Service: General;  Laterality: Left;   PORT-A-CATH REMOVAL Right 10/30/2023   Procedure: REMOVAL PORT-A-CATH;  Surgeon: Vanderbilt Ned, MD;  Location: [facility];  Service: General;  Laterality: Right;   PORTACATH PLACEMENT N/A 10/09/2022   Procedure: INSERTION PORT-A-CATH;  Surgeon: Vanderbilt Ned, MD;  Location: [facility];  Service: General;  Laterality: N/A;   SIMPLE MASTECTOMY WITH AXILLARY SENTINEL NODE BIOPSY Right 08/15/2022   Procedure: RIGHT RISK REDUCING MASTECTOMY;  Surgeon: Vanderbilt Ned, MD;  Location: [facility];  Service: General;  Laterality: Right;    Social History   Socioeconomic History   Marital status: Married    Spouse name: Not on file   Number of children:  Not on file   Years of education: Not on file   Highest education level: Some college, no degree  Occupational History   Not on file  Tobacco Use   Smoking status: Never   Smokeless tobacco: Never  Vaping Use   Vaping status: Never Used  Substance and Sexual Activity   Alcohol use: No   Drug use: No   Sexual activity: Yes    Birth control/protection: Post-menopausal, Surgical    Comment: hyst  Other Topics Concern   Not on file  Social History Narrative   Not on file   Social Drivers of Health   Tobacco Use: Low Risk (01/13/2024)   Patient History    Smoking Tobacco Use: Never    Smokeless Tobacco Use:  Never    Passive Exposure: Not on file  Financial Resource Strain: Patient Declined (10/16/2022)   Overall Financial Resource Strain (CARDIA)    Difficulty of Paying Living Expenses: Patient declined  Recent Concern: Physicist, Medical Strain - High Risk (10/03/2022)   Overall Financial Resource Strain (CARDIA)    Difficulty of Paying Living Expenses: Hard  Food Insecurity: No Food Insecurity (11/27/2022)   Hunger Vital Sign    Worried About Running Out of Food in the Last Year: Never true    Ran Out of Food in the Last Year: Never true  Transportation Needs: No Transportation Needs (11/27/2022)   PRAPARE - Administrator, Civil Service (Medical): No    Lack of Transportation (Non-Medical): No  Physical Activity: Insufficiently Active (10/16/2022)   Exercise Vital Sign    Days of Exercise per Week: 1 day    Minutes of Exercise per Session: 20 min  Stress: No Stress Concern Present (10/16/2022)   [employer] of Occupational Health - Occupational Stress Questionnaire    Feeling of Stress : Not at all  Social Connections: Unknown (10/16/2022)   Social Connection and Isolation Panel    Frequency of Communication with Friends and Family: Patient declined    Frequency of Social Gatherings with Friends and Family: Patient declined    Attends Religious Services: More than 4 times per year    Active Member of [employer] or Organizations: Yes    Attends Engineer, Structural: More than 4 times per year    Marital Status: Married  Catering Manager Violence: Not At Risk (11/27/2022)   Humiliation, Afraid, Rape, and Kick questionnaire    Fear of Current or Ex-Partner: No    Emotionally Abused: No    Physically Abused: No    Sexually Abused: No  Depression (PHQ2-9): Low Risk (01/13/2024)   Depression (PHQ2-9)    PHQ-2 Score: 0  Alcohol Screen: Not on file  Housing: Unknown (03/27/2023)   Received from [facility]   Epic    Unable to Pay for Housing in the  Last Year: Not on file    Number of Times Moved in the Last Year: Not on file    At any time in the past 12 months, were you homeless or living in a shelter (including now)?: No  Utilities: Not At Risk (11/27/2022)   [facility] Utilities    Threatened with loss of utilities: No  Health Literacy: Not on file    Family History  Problem Relation Age of Onset   Hypertension Mother    Hypertension Father    Heart disease Father 44       MI in his 73s   Cancer Maternal Aunt  unk type, possibly hip. dx >50   Breast cancer Paternal Aunt        dx 73s   Cancer Paternal Aunt        unknown, d. 1s   Prostate cancer Cousin        dx 60s-50s   Stomach cancer Cousin        dx 40s-50s    Current Medications[2]  Physical exam:  Vitals:   01/13/24 0954  BP: (!) 117/56  Pulse: 87  Resp: 18  Temp: (!) 97.2 F (36.2 C)  TempSrc: Tympanic  SpO2: 100%  Weight: 93 lb 6.4 oz (42.4 kg)  Height: 5' 3 (1.6 m)   Physical Exam Cardiovascular:     Rate and Rhythm: Normal rate and regular rhythm.     Heart sounds: Normal heart sounds.  Pulmonary:     Effort: Pulmonary effort is normal.     Breath sounds: Normal breath sounds.  Skin:    General: Skin is warm and dry.  Neurological:     Mental Status: She is alert and oriented to person, place, and time.   Patient is s/p bilateral mastectomy without reconstruction.  No evidence of chest wall recurrence.  No palpable bilateral axillary adenopathy.  I have personally reviewed labs listed below:    Latest Ref Rng & Units 09/17/2023   12:33 PM  CMP  Glucose 70 - 99 mg/dL 94   BUN 6 - 20 mg/dL 18   Creatinine 9.55 - 1.00 mg/dL 9.39   Sodium 864 - 854 mmol/L 134   Potassium 3.5 - 5.1 mmol/L 3.9   Chloride 98 - 111 mmol/L 101   CO2 22 - 32 mmol/L 25   Calcium 8.9 - 10.3 mg/dL 8.7   Total Protein 6.5 - 8.1 g/dL 6.1   Total Bilirubin 0.0 - 1.2 mg/dL 0.5   Alkaline Phos 38 - 126 U/L 50   AST 15 - 41 U/L 19   ALT 0 - 44 U/L 18        Latest Ref Rng & Units 09/17/2023   12:33 PM  CBC  WBC 4.0 - 10.5 K/uL 4.8   Hemoglobin 12.0 - 15.0 g/dL 87.9   Hematocrit 63.9 - 46.0 % 36.1   Platelets 150 - 400 K/uL 163      Assessment and plan- Patient is a 58 y.o. female with history of BRCA2 mutation and second left breast cancer stage Ib MP T2 N0 versus pT1 cN1 M0 ER/PR positive HER2 positive.  She completed adjuvant Herceptin .  She is here for routine follow-up of breast cancer  Assessment and Plan    Invasive breast cancer - Clinically patient is doing well with no concerning signs and symptoms of recurrence based on today's exam -Patient has not started endocrine therapy yet and we discussed various options including AI versus full dose tamoxifen  20 mg versus half dose tamoxifen  10 mg given her concerns with tolerating medications in the past. - Considering adjuvant endocrine therapy. Aromatase inhibitors may worsen osteoporosis. Tamoxifen  recommended due to lower osteoporosis risk. Prefers starting at 10 mg daily with potential increase to 20 mg. - Initiated tamoxifen  10 mg daily. - Follow-up in 4 months to reassess tamoxifen  tolerance, discuss dose escalation, and address osteoporosis management.  She wants to hold off on starting any additional medications for osteoporosis at this time        Visit Diagnosis 1. Malignant neoplasm of upper-outer quadrant of left breast in female, estrogen receptor positive (HCC)   2.  Other osteoporosis without current pathological fracture      Dr. Annah Skene, MD, MPH [facility] at [facility] [phone] 01/13/2024 10:49 AM                   [1]  Allergies Allergen Reactions   Benadryl  [Diphenhydramine  Hcl]     Jittery and Hallucinations   Diphenhydramine  Hcl Anxiety    Other Reaction(s): Hallucination  Jittery and Hallucinations    Made her feel like she was crawling the walls.   Hydrocodone  Shortness Of Breath    Felt like she could not  breathe-February 2020   Penicillins Hives and Rash    Did it involve swelling of the face/tongue/throat, SOB, or low BP? No  Did it involve sudden or severe rash/hives, skin peeling, or any reaction on the inside of your mouth or nose? No  Did you need to seek medical attention at a hospital or doctor's office? No  When did it last happen?        If all above answers are NO, may proceed with cephalosporin use.  Any medicine related to PCN    Did it involve swelling of the face/tongue/throat, SOB, or low BP? No Did it involve sudden or severe rash/hives, skin peeling, or any reaction on the inside of your mouth or nose? No Did you need to seek medical attention at a hospital or doctor's office? No When did it last happen?       If all above answers are NO, may proceed with cephalosporin use.   Ciprofloxacin Hives   Docetaxel  Other (See Comments)    Tingling of lips, teeth hurting, hot tongue   Doxycycline  Other (See Comments)    Mouth pain    Trastuzumab -Anns [Trastuzumab -Pkrb] Itching    Tingling and hot tongue and lips  [2]  Current Outpatient Medications:    Calcium Carbonate Antacid (TUMS PO), Take 1 tablet by mouth as needed. Reported on 05/22/2015, Disp: , Rfl:    Cholecalciferol (VITAMIN D  PO), Take 1,000 Units by mouth daily., Disp: , Rfl:    lidocaine -prilocaine  (EMLA ) cream, Apply a quarter-sized amount to port a cath site and cover with plastic wrap 1 hour prior to infusion appointments, Disp: 30 g, Rfl: 3   tamoxifen  (NOLVADEX ) 10 MG tablet, Take 1 tablet (10 mg total) by mouth daily., Disp: 30 tablet, Rfl: 3 No current facility-administered medications for this visit.  Facility-Administered Medications Ordered in Other Visits:    0.9 %  sodium chloride  infusion, , Intravenous, Continuous, Skene Annah BROCKS, MD, Stopped at 06/25/23 1300   sodium chloride  flush (NS) 0.9 % injection 10 mL, 10 mL, Intracatheter, PRN, Skene Annah BROCKS, MD, 10 mL at 06/25/23 1101  "

## 2024-02-05 ENCOUNTER — Encounter: Payer: Self-pay | Admitting: Radiation Oncology

## 2024-02-05 ENCOUNTER — Ambulatory Visit
Admission: RE | Admit: 2024-02-05 | Discharge: 2024-02-05 | Disposition: A | Discharge status: Home / Self Care | Source: Ambulatory Visit | Attending: Radiation Oncology | Admitting: Radiation Oncology

## 2024-02-05 VITALS — BP 136/61 | HR 80 | Temp 97.6°F | Resp 14 | Ht 63.0 in | Wt 94.8 lb

## 2024-02-05 DIAGNOSIS — Z923 Personal history of irradiation: Secondary | ICD-10-CM | POA: Diagnosis not present

## 2024-02-05 DIAGNOSIS — Z17410 Hormone receptor positive with human epidermal growth factor receptor 2 positive status: Secondary | ICD-10-CM | POA: Insufficient documentation

## 2024-02-05 DIAGNOSIS — Z79810 Long term (current) use of selective estrogen receptor modulators (SERMs): Secondary | ICD-10-CM | POA: Insufficient documentation

## 2024-02-05 DIAGNOSIS — C50412 Malignant neoplasm of upper-outer quadrant of left female breast: Secondary | ICD-10-CM | POA: Diagnosis present

## 2024-02-05 NOTE — Progress Notes (Signed)
 Radiation Oncology Follow up Note  Name: Melissa Rodriguez   Date:   02/05/2024 MRN:  [id number] DOB: Jan 26, 1965    This 59 y.o. female presents to the clinic today for 78-month follow-up status post radiotherapy to her left chest wall for recurrent stage I triple positive invasive mammary carcinoma.  REFERRING PROVIDER: Alphonsa Glendia LABOR, MD  HPI: Patient is a 59 year old female now at 7 months having completed salvage radiation therapy to her left chest wall status post mastectomy for triple positive invasive mammary carcinoma.  She recently been treated back in 2008 for stage I ER/PR positive left breast cancer.  2 separate masses that time largest measuring 2.4 cm in greatest dimension.  Smaller lesion had a positive margin she underwent TCHP chemotherapy.  We completed chest wall radiation therapy to her left side and peripheral lymphatics.  She is seen today in routine follow-up is doing well specifically denies any chest wall pain any new mass or nodularity cough or bone pain.  She is currently on tamoxifen  tolerating it well without side effect.  She continues close follow-up care with medical oncology.  COMPLICATIONS OF TREATMENT: none  FOLLOW UP COMPLIANCE: keeps appointments   PHYSICAL EXAM:  BP 136/61   Pulse 80   Temp 97.6 F (36.4 C) (Tympanic)   Resp 14   Ht 5' 3 (1.6 m)   Wt 94 lb 12.8 oz (43 kg)   LMP 07/05/2012   BMI 16.79 kg/m  Patient status post bilateral mastectomies.  Incisions are well-healed no evidence of mass or nodularity in either chest wall is noted.  No axillary or supraclavicular adenopathy is identified.  No evidence of lymphedema is noted.  Well-developed well-nourished patient in NAD. HEENT reveals PERLA, EOMI, discs not visualized.  Oral cavity is clear. No oral mucosal lesions are identified. Neck is clear without evidence of cervical or supraclavicular adenopathy. Lungs are clear to A&P. Cardiac examination is essentially unremarkable with regular rate and  rhythm without murmur rub or thrill. Abdomen is benign with no organomegaly or masses noted. Motor sensory and DTR levels are equal and symmetric in the upper and lower extremities. Cranial nerves II through XII are grossly intact. Proprioception is intact. No peripheral adenopathy or edema is identified. No motor or sensory levels are noted. Crude visual fields are within normal range.  RADIOLOGY RESULTS: No current films to review  PLAN: At this time she will continue close follow-up care with medical oncology and Dr. Melanee.  I will turn follow-up care over to her.  I be happy to reevaluate patient anytime should that be indicated.  Patient knows to call with any concerns.  I would like to take this opportunity to thank you for allowing me to participate in the care of your patient.SABRA Marcey Penton, MD

## 2024-05-12 ENCOUNTER — Inpatient Hospital Stay: Admitting: Oncology

## 2024-08-05 ENCOUNTER — Ambulatory Visit: Admitting: Radiation Oncology

## 2024-10-26 ENCOUNTER — Encounter: Admitting: Dermatology
